# Patient Record
Sex: Female | Born: 1950 | Race: White | Hispanic: No | Marital: Single | Smoking: Never smoker
Health system: Southern US, Community
[De-identification: ages and names within clinical notes are randomized; demographics above are authoritative.]

## PROBLEM LIST (undated history)

## (undated) DIAGNOSIS — I890 Lymphedema, not elsewhere classified: Secondary | ICD-10-CM

## (undated) DIAGNOSIS — Z923 Personal history of irradiation: Secondary | ICD-10-CM

## (undated) DIAGNOSIS — K219 Gastro-esophageal reflux disease without esophagitis: Secondary | ICD-10-CM

## (undated) DIAGNOSIS — Z8719 Personal history of other diseases of the digestive system: Secondary | ICD-10-CM

## (undated) DIAGNOSIS — K759 Inflammatory liver disease, unspecified: Secondary | ICD-10-CM

## (undated) DIAGNOSIS — E785 Hyperlipidemia, unspecified: Secondary | ICD-10-CM

## (undated) DIAGNOSIS — Z8669 Personal history of other diseases of the nervous system and sense organs: Secondary | ICD-10-CM

## (undated) DIAGNOSIS — N289 Disorder of kidney and ureter, unspecified: Secondary | ICD-10-CM

## (undated) DIAGNOSIS — R519 Headache, unspecified: Secondary | ICD-10-CM

## (undated) DIAGNOSIS — L0390 Cellulitis, unspecified: Secondary | ICD-10-CM

## (undated) DIAGNOSIS — I493 Ventricular premature depolarization: Secondary | ICD-10-CM

## (undated) DIAGNOSIS — R51 Headache: Secondary | ICD-10-CM

## (undated) DIAGNOSIS — C50919 Malignant neoplasm of unspecified site of unspecified female breast: Secondary | ICD-10-CM

## (undated) DIAGNOSIS — M8580 Other specified disorders of bone density and structure, unspecified site: Secondary | ICD-10-CM

## (undated) DIAGNOSIS — C50512 Malignant neoplasm of lower-outer quadrant of left female breast: Secondary | ICD-10-CM

## (undated) DIAGNOSIS — M1990 Unspecified osteoarthritis, unspecified site: Secondary | ICD-10-CM

## (undated) DIAGNOSIS — J020 Streptococcal pharyngitis: Secondary | ICD-10-CM

## (undated) DIAGNOSIS — Z87442 Personal history of urinary calculi: Secondary | ICD-10-CM

## (undated) DIAGNOSIS — R011 Cardiac murmur, unspecified: Secondary | ICD-10-CM

## (undated) HISTORY — DX: Hyperlipidemia, unspecified: E78.5

## (undated) HISTORY — PX: TONSILLECTOMY AND ADENOIDECTOMY: SUR1326

## (undated) HISTORY — DX: Personal history of urinary calculi: Z87.442

## (undated) HISTORY — DX: Disorder of kidney and ureter, unspecified: N28.9

## (undated) HISTORY — DX: Personal history of irradiation: Z92.3

## (undated) HISTORY — DX: Lymphedema, not elsewhere classified: I89.0

## (undated) HISTORY — DX: Personal history of other diseases of the nervous system and sense organs: Z86.69

## (undated) HISTORY — PX: BREAST SURGERY: SHX581

## (undated) HISTORY — PX: FOOT SURGERY: SHX648

## (undated) HISTORY — DX: Malignant neoplasm of unspecified site of unspecified female breast: C50.919

## (undated) HISTORY — DX: Ventricular premature depolarization: I49.3

## (undated) HISTORY — PX: CYSTOGRAM: SHX1420

## (undated) HISTORY — DX: Cellulitis, unspecified: L03.90

## (undated) HISTORY — DX: Unspecified osteoarthritis, unspecified site: M19.90

## (undated) HISTORY — DX: Streptococcal pharyngitis: J02.0

## (undated) HISTORY — DX: Other specified disorders of bone density and structure, unspecified site: M85.80

---
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
## 1997-04-21 DIAGNOSIS — Z923 Personal history of irradiation: Secondary | ICD-10-CM

## 1997-04-21 DIAGNOSIS — C50919 Malignant neoplasm of unspecified site of unspecified female breast: Secondary | ICD-10-CM

## 1997-04-21 HISTORY — DX: Personal history of irradiation: Z92.3

## 1997-04-21 HISTORY — DX: Malignant neoplasm of unspecified site of unspecified female breast: C50.919

## 1997-04-21 HISTORY — PX: MASTECTOMY: SHX3

## 1997-10-02 ENCOUNTER — Other Ambulatory Visit: Admission: RE | Admit: 1997-10-02 | Discharge: 1997-10-02 | Payer: Self-pay | Admitting: General Surgery

## 1997-10-04 ENCOUNTER — Ambulatory Visit (HOSPITAL_BASED_OUTPATIENT_CLINIC_OR_DEPARTMENT_OTHER): Admission: RE | Admit: 1997-10-04 | Discharge: 1997-10-04 | Payer: Self-pay | Admitting: General Surgery

## 1997-10-27 ENCOUNTER — Inpatient Hospital Stay (HOSPITAL_COMMUNITY): Admission: RE | Admit: 1997-10-27 | Discharge: 1997-10-30 | Payer: Self-pay | Admitting: General Surgery

## 1997-10-27 HISTORY — PX: OTHER SURGICAL HISTORY: SHX169

## 1997-11-09 ENCOUNTER — Ambulatory Visit (HOSPITAL_COMMUNITY): Admission: RE | Admit: 1997-11-09 | Discharge: 1997-11-09 | Payer: Self-pay | Admitting: Oncology

## 1997-11-14 ENCOUNTER — Encounter: Admission: RE | Admit: 1997-11-14 | Discharge: 1998-02-07 | Payer: Self-pay | Admitting: Radiation Oncology

## 1998-01-18 ENCOUNTER — Inpatient Hospital Stay (HOSPITAL_COMMUNITY): Admission: AD | Admit: 1998-01-18 | Discharge: 1998-01-23 | Payer: Self-pay | Admitting: Oncology

## 1998-02-07 ENCOUNTER — Encounter: Admission: RE | Admit: 1998-02-07 | Discharge: 1998-05-08 | Payer: Self-pay | Admitting: Radiation Oncology

## 1998-02-10 ENCOUNTER — Ambulatory Visit (HOSPITAL_COMMUNITY): Admission: RE | Admit: 1998-02-10 | Discharge: 1998-02-10 | Payer: Self-pay | Admitting: Oncology

## 1999-01-20 HISTORY — PX: FINGER SURGERY: SHX640

## 1999-02-20 ENCOUNTER — Ambulatory Visit (HOSPITAL_BASED_OUTPATIENT_CLINIC_OR_DEPARTMENT_OTHER): Admission: RE | Admit: 1999-02-20 | Discharge: 1999-02-20 | Payer: Self-pay

## 2000-01-06 ENCOUNTER — Ambulatory Visit (HOSPITAL_BASED_OUTPATIENT_CLINIC_OR_DEPARTMENT_OTHER): Admission: RE | Admit: 2000-01-06 | Discharge: 2000-01-06 | Payer: Self-pay | Admitting: Plastic Surgery

## 2002-05-03 ENCOUNTER — Other Ambulatory Visit: Admission: RE | Admit: 2002-05-03 | Discharge: 2002-05-03 | Payer: Self-pay | Admitting: Obstetrics and Gynecology

## 2003-04-22 HISTORY — PX: OTHER SURGICAL HISTORY: SHX169

## 2003-05-02 ENCOUNTER — Ambulatory Visit (HOSPITAL_BASED_OUTPATIENT_CLINIC_OR_DEPARTMENT_OTHER): Admission: RE | Admit: 2003-05-02 | Discharge: 2003-05-02 | Payer: Self-pay | Admitting: Orthopedic Surgery

## 2003-05-02 ENCOUNTER — Ambulatory Visit (HOSPITAL_COMMUNITY): Admission: RE | Admit: 2003-05-02 | Discharge: 2003-05-02 | Payer: Self-pay | Admitting: Orthopedic Surgery

## 2003-07-03 ENCOUNTER — Other Ambulatory Visit: Admission: RE | Admit: 2003-07-03 | Discharge: 2003-07-03 | Payer: Self-pay | Admitting: Obstetrics and Gynecology

## 2004-04-21 HISTORY — PX: OTHER SURGICAL HISTORY: SHX169

## 2004-05-02 ENCOUNTER — Ambulatory Visit (HOSPITAL_COMMUNITY): Admission: RE | Admit: 2004-05-02 | Discharge: 2004-05-02 | Payer: Self-pay | Admitting: Orthopedic Surgery

## 2004-05-02 ENCOUNTER — Ambulatory Visit (HOSPITAL_BASED_OUTPATIENT_CLINIC_OR_DEPARTMENT_OTHER): Admission: RE | Admit: 2004-05-02 | Discharge: 2004-05-02 | Payer: Self-pay | Admitting: Orthopedic Surgery

## 2004-07-05 ENCOUNTER — Ambulatory Visit: Payer: Self-pay | Admitting: Oncology

## 2004-08-06 ENCOUNTER — Other Ambulatory Visit: Admission: RE | Admit: 2004-08-06 | Discharge: 2004-08-06 | Payer: Self-pay | Admitting: Obstetrics and Gynecology

## 2004-11-05 ENCOUNTER — Ambulatory Visit (HOSPITAL_COMMUNITY): Admission: RE | Admit: 2004-11-05 | Discharge: 2004-11-05 | Payer: Self-pay | Admitting: Orthopedic Surgery

## 2004-11-06 ENCOUNTER — Ambulatory Visit (HOSPITAL_COMMUNITY): Admission: RE | Admit: 2004-11-06 | Discharge: 2004-11-06 | Payer: Self-pay | Admitting: Orthopedic Surgery

## 2005-02-11 ENCOUNTER — Encounter (HOSPITAL_COMMUNITY): Admission: RE | Admit: 2005-02-11 | Discharge: 2005-05-12 | Payer: Self-pay | Admitting: Orthopedic Surgery

## 2005-05-20 ENCOUNTER — Encounter: Admission: RE | Admit: 2005-05-20 | Discharge: 2005-07-29 | Payer: Self-pay | Admitting: Orthopedic Surgery

## 2005-07-04 ENCOUNTER — Ambulatory Visit: Payer: Self-pay | Admitting: Oncology

## 2005-07-20 HISTORY — PX: FOOT SURGERY: SHX648

## 2005-08-11 ENCOUNTER — Ambulatory Visit (HOSPITAL_BASED_OUTPATIENT_CLINIC_OR_DEPARTMENT_OTHER): Admission: RE | Admit: 2005-08-11 | Discharge: 2005-08-11 | Payer: Self-pay | Admitting: Podiatry

## 2005-09-12 ENCOUNTER — Other Ambulatory Visit: Admission: RE | Admit: 2005-09-12 | Discharge: 2005-09-12 | Payer: Self-pay | Admitting: Obstetrics and Gynecology

## 2006-07-23 ENCOUNTER — Ambulatory Visit: Payer: Self-pay | Admitting: Oncology

## 2006-07-28 ENCOUNTER — Ambulatory Visit (HOSPITAL_COMMUNITY): Admission: RE | Admit: 2006-07-28 | Discharge: 2006-07-28 | Payer: Self-pay | Admitting: Oncology

## 2006-08-05 ENCOUNTER — Ambulatory Visit (HOSPITAL_COMMUNITY): Admission: RE | Admit: 2006-08-05 | Discharge: 2006-08-05 | Payer: Self-pay | Admitting: Oncology

## 2006-09-03 ENCOUNTER — Encounter (HOSPITAL_COMMUNITY): Admission: RE | Admit: 2006-09-03 | Discharge: 2006-11-30 | Payer: Self-pay | Admitting: Orthopedic Surgery

## 2006-11-10 ENCOUNTER — Other Ambulatory Visit: Admission: RE | Admit: 2006-11-10 | Discharge: 2006-11-10 | Payer: Self-pay | Admitting: Obstetrics and Gynecology

## 2007-02-19 ENCOUNTER — Ambulatory Visit: Payer: Self-pay | Admitting: Oncology

## 2007-07-09 ENCOUNTER — Ambulatory Visit: Payer: Self-pay | Admitting: Oncology

## 2007-07-20 ENCOUNTER — Encounter: Payer: Self-pay | Admitting: Cardiology

## 2007-11-23 ENCOUNTER — Other Ambulatory Visit: Admission: RE | Admit: 2007-11-23 | Discharge: 2007-11-23 | Payer: Self-pay | Admitting: Obstetrics and Gynecology

## 2008-02-22 ENCOUNTER — Ambulatory Visit: Payer: Self-pay | Admitting: Obstetrics and Gynecology

## 2008-04-04 ENCOUNTER — Ambulatory Visit: Payer: Self-pay | Admitting: Obstetrics and Gynecology

## 2008-07-07 ENCOUNTER — Ambulatory Visit: Payer: Self-pay | Admitting: Oncology

## 2008-07-27 ENCOUNTER — Encounter (HOSPITAL_COMMUNITY): Admission: RE | Admit: 2008-07-27 | Discharge: 2008-10-25 | Payer: Self-pay | Admitting: Orthopedic Surgery

## 2008-09-21 ENCOUNTER — Emergency Department (HOSPITAL_COMMUNITY): Admission: EM | Admit: 2008-09-21 | Discharge: 2008-09-21 | Payer: Self-pay | Admitting: Emergency Medicine

## 2009-01-09 ENCOUNTER — Ambulatory Visit: Payer: Self-pay | Admitting: Obstetrics and Gynecology

## 2009-01-09 ENCOUNTER — Encounter: Payer: Self-pay | Admitting: Obstetrics and Gynecology

## 2009-01-09 ENCOUNTER — Other Ambulatory Visit: Admission: RE | Admit: 2009-01-09 | Discharge: 2009-01-09 | Payer: Self-pay | Admitting: Obstetrics and Gynecology

## 2009-07-06 ENCOUNTER — Ambulatory Visit: Payer: Self-pay | Admitting: Oncology

## 2010-01-01 ENCOUNTER — Ambulatory Visit: Payer: Self-pay | Admitting: Internal Medicine

## 2010-01-01 ENCOUNTER — Ambulatory Visit (HOSPITAL_COMMUNITY): Admission: RE | Admit: 2010-01-01 | Discharge: 2010-01-01 | Payer: Self-pay | Admitting: Oncology

## 2010-01-06 ENCOUNTER — Telehealth: Payer: Self-pay | Admitting: Internal Medicine

## 2010-02-08 ENCOUNTER — Ambulatory Visit (HOSPITAL_COMMUNITY): Admission: RE | Admit: 2010-02-08 | Discharge: 2010-02-08 | Payer: Self-pay | Admitting: Orthopedic Surgery

## 2010-05-21 ENCOUNTER — Other Ambulatory Visit: Payer: Self-pay | Admitting: Obstetrics and Gynecology

## 2010-05-21 ENCOUNTER — Other Ambulatory Visit (HOSPITAL_COMMUNITY)
Admission: RE | Admit: 2010-05-21 | Discharge: 2010-05-21 | Disposition: A | Discharge status: Home / Self Care | Payer: 59 | Source: Ambulatory Visit | Attending: Obstetrics and Gynecology | Admitting: Obstetrics and Gynecology

## 2010-05-21 ENCOUNTER — Ambulatory Visit
Admission: RE | Admit: 2010-05-21 | Discharge: 2010-05-21 | Payer: Self-pay | Source: Home / Self Care | Attending: Obstetrics and Gynecology | Admitting: Obstetrics and Gynecology

## 2010-05-21 DIAGNOSIS — Z124 Encounter for screening for malignant neoplasm of cervix: Secondary | ICD-10-CM | POA: Insufficient documentation

## 2010-05-23 NOTE — Letter (Signed)
Summary: Order for Stat Creatinine From Dr. Truett Perna  BREAST CREA-STATFOR MRI   Imported By: Shon Hough 01/09/2010 11:56:28  _____________________________________________________________________  External Attachment:    Type:   Image     Comment:   External Document

## 2010-05-23 NOTE — Progress Notes (Signed)
Summary: Re: Creatinine result  ---- Converted from flag ---- ---- 01/04/2010 12:39 PM, Alric Quan wrote: Dr Meredith Pel,  The result must have routed to you based on the way Micheala was registered. She had an order from her physician for a Crea to be collected before her MRI. She should have been registered under her physians name.  The results were given to [facility] as instructed.   I will immediately fax a copy to her physcian Dr Truett Perna at the [facility].  Tracey  ---- 01/04/2010 12:31 PM, Margarito Liner MD wrote: Kenn[facility],  I don't know anything about this lab result.  Dorene Sorrow ------------------------------

## 2010-06-25 ENCOUNTER — Ambulatory Visit (INDEPENDENT_AMBULATORY_CARE_PROVIDER_SITE_OTHER): Payer: 59 | Admitting: Cardiology

## 2010-06-25 DIAGNOSIS — E7800 Pure hypercholesterolemia, unspecified: Secondary | ICD-10-CM

## 2010-06-25 DIAGNOSIS — R002 Palpitations: Secondary | ICD-10-CM

## 2010-06-25 DIAGNOSIS — R0602 Shortness of breath: Secondary | ICD-10-CM

## 2010-06-26 ENCOUNTER — Other Ambulatory Visit (INDEPENDENT_AMBULATORY_CARE_PROVIDER_SITE_OTHER): Payer: 59

## 2010-06-26 DIAGNOSIS — E7800 Pure hypercholesterolemia, unspecified: Secondary | ICD-10-CM

## 2010-07-04 LAB — CREATININE, SERUM
Creatinine, Ser: 0.69 mg/dL (ref 0.4–1.2)
GFR calc Af Amer: >60 mL/min (ref >60)
GFR calc non Af Amer: >60 mL/min (ref >60)

## 2010-07-09 ENCOUNTER — Encounter (HOSPITAL_BASED_OUTPATIENT_CLINIC_OR_DEPARTMENT_OTHER): Payer: 59 | Admitting: Oncology

## 2010-07-09 DIAGNOSIS — C50919 Malignant neoplasm of unspecified site of unspecified female breast: Secondary | ICD-10-CM

## 2010-07-09 DIAGNOSIS — IMO0002 Reserved for concepts with insufficient information to code with codable children: Secondary | ICD-10-CM

## 2010-07-09 DIAGNOSIS — Z803 Family history of malignant neoplasm of breast: Secondary | ICD-10-CM

## 2010-07-09 DIAGNOSIS — C50419 Malignant neoplasm of upper-outer quadrant of unspecified female breast: Secondary | ICD-10-CM

## 2010-07-16 ENCOUNTER — Other Ambulatory Visit (HOSPITAL_COMMUNITY): Payer: Self-pay | Admitting: Radiology

## 2010-07-16 DIAGNOSIS — R0600 Dyspnea, unspecified: Secondary | ICD-10-CM

## 2010-07-17 ENCOUNTER — Ambulatory Visit (HOSPITAL_COMMUNITY): Payer: 59 | Admitting: Radiology

## 2010-07-17 ENCOUNTER — Other Ambulatory Visit (HOSPITAL_COMMUNITY): Payer: 59 | Admitting: Radiology

## 2010-07-17 ENCOUNTER — Ambulatory Visit (HOSPITAL_COMMUNITY): Payer: 59 | Attending: Cardiology | Admitting: Radiology

## 2010-07-17 DIAGNOSIS — R0609 Other forms of dyspnea: Secondary | ICD-10-CM | POA: Insufficient documentation

## 2010-07-17 DIAGNOSIS — R0602 Shortness of breath: Secondary | ICD-10-CM

## 2010-07-17 DIAGNOSIS — R0989 Other specified symptoms and signs involving the circulatory and respiratory systems: Secondary | ICD-10-CM | POA: Insufficient documentation

## 2010-07-17 DIAGNOSIS — R0600 Dyspnea, unspecified: Secondary | ICD-10-CM

## 2010-07-18 ENCOUNTER — Telehealth: Payer: Self-pay | Admitting: *Deleted

## 2010-07-22 NOTE — Telephone Encounter (Signed)
Pt's mom Malachi Bonds notified of pt's stress echo results.

## 2010-09-06 NOTE — Op Note (Signed)
[place]. [facility]  Patient:    Cheryl [name], Cheryl [name]                      MRN: [id number] Proc. Date: 01/06/00 Adm. Date:  [id number] Attending:  Loura Halt Ii                           Operative Report  PREOPERATIVE DIAGNOSES: 1. Right breast cancer. 2. Acquired absence right breast.  POSTOPERATIVE DIAGNOSES: 1. Right breast cancer. 2. Acquired absence right breast.  PROCEDURE:  Right nipple reconstruction.  SURGEON:  Alfredia Ferguson, M.D.  ANESTHESIA:  None required.  INDICATIONS FOR SURGERY:  This is a 60 year old woman who is status post right mastectomy for breast cancer.  She underwent reconstruction with a TRAM flap. She is now ready for nipple reconstruction.  She understands the risk of failure due to vascular compromise.  DESCRIPTION OF SURGERY:  With the patient in the sitting position, location of the new nipple was marked.  The patient was then placed in a supine position and a 42 mm diameter circle was drawn.  Within the confines of this circle a tripartate flap was drawn which was inferiorly based.  The 3 points of the flap were pointing to the 9, 12, and 3 oclock positions.  The right reconstructive breast was prepped and draped in a sterile fashion.  The tripartate flap was now sized to the level of subcutaneous tissue.  The TIPS of each of the points was excised to create blunt ends of the flap.   The flap was now elevated with approximately 2-3 mm of thickness of fat on the flaps in a superior to inferior direction.  This left a approximately 2 cm wide inferior base random skin connection to the flap.  The 3 oclock and 9 oclock flaps were now rotated towards one another and the two blunt ends were closed together using interrupted 4-0 chromic suture.  This created a cylinder.  The flap which was pointed at the 12 oclock position was now closed down on top of this cylinder and fixed into position with a similar suture.   The donor site was now closed by approximating the dermis using interrupted 4-0 Vicryl suture.  The skin edges were closed using 4-0 chromic suture.  The viability of the flap appeared to be good although slightly pale. A bulky dressing was applied.  The patient was discharged home in satisfactory condition. DD:  01/06/00 TD:  01/07/00 Job: [id number]

## 2010-09-06 NOTE — Op Note (Signed)
[name], [name]               ACCOUNT NO.:  [id number]   MEDICAL RECORD NO.:  [id number]          PATIENT TYPE:  AMB   LOCATION:  DSC                          FACILITY:  [facility]   PHYSICIAN:  Dionne Ano. Gramig III, M.D.DATE OF BIRTH:  1950-08-05   DATE OF PROCEDURE:  05/02/2004  DATE OF DISCHARGE:                                 OPERATIVE REPORT   PREOPERATIVE DIAGNOSES:  1.  Left carpal tunnel syndrome.  2.  Left carpometacarpal joint arthritis, end stage.   POSTOPERATIVE DIAGNOSES:  1.  Left carpal tunnel syndrome.  2.  Left carpometacarpal joint arthritis, end stage.   OPERATION PERFORMED:  1.  Left limited open carpal tunnel release.  2.  Left carpometacarpal arthroplasty (trapezium excision at the left base      of thumb joint region), left thumb.  3.  Tendon transfer in the form of a one third abductor pollicis longus      proper portion of tendon transferred to the flexor carpi radialis and      abductor pollicis longus and back upon itself with multiple figure-of-      eight throws (Weilby tendon transfer/suspension) left base of thumb      joint.  4.  Tendon transfer in the form of a digastric abductor pollicis longus      tendon transfer to the first metacarpal flexor carpi radialis and back      upon itself with multiple figure-of-eight and weave throws (Zancolli      tendon transfer/suspension) left base of thumb joint.  5.  Abductor pollicis longus tenodesis (shortening of wrist extensor at the      wrist level, left base of thumb joint), last base of thumb joint.   SURGEON:  Dionne Ano. Cheryl [name], M.D.   ASSISTANT:  Karie [name], P.A.-C.   ANESTHESIA:  Block with IV sedation.   COMPLICATIONS:  None.   TOURNIQUET TIME:  Less than an hour.   INDICATIONS FOR PROCEDURE:  The patient is a very pleasant female who  presents with the above-mentioned diagnosis.  I have counseled the patient  in regard to the risks and benefits of surgery and she desires to  proceed  with the operative intervention as described.   DESCRIPTION OF PROCEDURE:  The patient was seen by myself and anesthesia.  Correct extremity to be operated on was identified and the correct operation  was discussed with the patient.  She was taken to the operative suite,  underwent sedation, prepped and draped in the usual sterile fashion with  Betadine scrub and paint about the left upper extremity.  I then gave her  prophylactic antibiotics in the form of Ancef.  Once this was done, the arm  was elevated, the tourniquet was insufflated to 250 mmHg and a 1 cm incision  was made at the to expose  the  transverse carpal ligament.  Dissection was  carried down and the palmar fascia was incised.  I then placed a retractor,  retracting the palmar fascia out of harm's way.  The palmar fascia then was  out of the way and allowed for identification  of the distal edge of the  transverse carpal ligament which was released under 4.0 loupe magnification  without difficulty.  The patient tolerated this well.  There were no  complicating features.  I noted egression of the fat pad.  Distal to  proximal dissection was carried out until adequate room was available for  canal preparatory devices.  These were placed without difficulty and  meticulously in the midline placement position just under the proximal  leading leaflet of the transverse carpal ligament.  Following this, I then  placed the security clip in the area.  The obturator disengaged and the  security knife was then placed in a security clip effectively releasing the  proximal leaflet of the transverse carpal ligament.  The patient tolerated  this well.  There were no complicating features.  Once this was done, I  irrigated copiously and then closed the wound with interrupted Prolene.  Following this, I turned attention toward the carpometacarpal joint.  The  patient had a dorsal incision made overlying the carpometacarpal joint.   The  radial artery and superficial radial nerve branches were identified and  protected.  Following this, I then performed incision of the capsule and the  interval between the APL and EPL was created.  I then excised the trapezium  piecemeal without difficulty and performed a tenolysis and a tenosynovectomy  of the FCR which was freed up in the depths of the wound.  This completed  the arthroplasty/trapezium excision of the procedure.  The patient then had  a drill hole made dorsal to palmar, exiting intra-articularly in line with  the palmar beak ligament.  The area was then washed out once again.  All  debris was removed and looked quite well.  The arthritis was very impressive  about the [facility] joint I should note with pantrapezial changes evident on  intraoperative inspection.   Once this was done, I made a counterincision dorsally and harvested a one  third proper portion of the APL and the digastric portion of the APL.  This  wound was irrigated meticulously and sutured.  The tendons were allowed to  be retracted into the main incision about the base of the [facility] joint.  I then  performed a tendon transfer of the abductor pollicis longus digastric  portion through the drill hole dorsal to palmar against the base of the  metacarpal around the FCR twice and back upon the APL proper.  This was  inset with FiberWire suture to my satisfaction without difficulty.  Following this, I then tightened it accordingly with capsule placed over the  metacarpal drill hole.  This completed the Zancolli tendon transfer.  Once  this was done, we then performed a tendon weave of the one third proper  portion around the FCR back upon the APL proper and back upon themselves  with multiple figure-of-eight throws completing the Weilby tendon transfer.  This was inset with 4-0 FiberWire to my satisfaction without difficulty and was tightened accordingly.  Once this was done, I then performed abductor  pollicis  longus tenodesis.  This was a shortening of the wrist extensor at  the wrist level with FiberWire.  I imbricated this against the capsule  shortening the APL slightly to prevent dorsolateral subluxation of the [facility]  joint.  The patient tolerated the procedure well.  There were no  complicating features.  Following this, I then irrigated copiously, deflated  the tourniquet and performed a capsular closure with FiberWire of the 4-0  variety.  Thus double tendon transfer was performed as well as CMC  arthroplasty with trapezium excision and the APL tenodesis.  The skin edge  was closed with Prolene after hemostasis was secured.  The patient tolerated  the procedure well.  She was placed in a sterile dressing and in a thumb  spica splint well molded to my satisfaction.  I have made plans for the  patient to receive antibiotics in the postoperative recovery area and  discharge to home if appropriate on appropriate pain medicines as discussed  with the patient.  All questions have been encouraged and answered.       WMG/MEDQ  D:  05/02/2004  T:  05/02/2004  Job:  [id number]

## 2010-09-06 NOTE — Consult Note (Signed)
NAMENEVEA, Cheryl [name]               ACCOUNT NO.:  [id number]   MEDICAL RECORD NO.:  [id number]          PATIENT TYPE:  AMB   LOCATION:  DSC                          FACILITY:  [facility]   PHYSICIAN:  Ysidro Evert. Regal, D.P.M. DATE OF BIRTH:  [birth year]   DATE OF CONSULTATION:  08/11/2005  DATE OF DISCHARGE:  08/11/2005                                   CONSULTATION   PREOPERATIVE DIAGNOSES:  1.  Hallux abductor valgus deformity left.  2.  Plantar flexor second metatarsal left.  3.  Hammertoe syndrome second digit left.  4.  Hammertoe syndrome third digit left.   POSTOPERATIVE DIAGNOSIS:  1.  Hallux abductor valgus deformity left.  2.  Plantar flexor second metatarsal left.  3.  Hammertoe syndrome second digit left.  4.  Hammertoe syndrome third digit left.   SURGEON:  Ysidro Evert. Regal, D.P.M.   PROCEDURE:  Osteotomy with 4.5 K-wire left:  #1 sternal, #4 fusion with pin,  #3 left external K-wire fixation.   INDICATIONS:  Chronic discomfort, inability to wear shoe gear without  difficulty.  Has tried wider shoes.  Has tried padding and has tried anti-  inflammatories without relief of symptoms.   FINDINGS AND PROCEDURE:  The patient was brought to the OR and placed in the  supine position on the OR table.  The patient was injected with a total of  20 mL Alcaine/Marcaine mixture.  The patient's left foot was prepped and  draped utilizing standard ___________ technique.  The left foot was  exsanguinated utilizing esmarch and the left ankle tourniquet was inflated  to 250 mmHg.  The following procedures were performed:  Procedure #1, [place]  bunionectomy left:  Attention was directed dorsal aspect left foot where an  approximate 7 cm linear incision was made.  Medial imminence was exposed and  resected.  Attention was then directed to the first intermetatarsal space  with utilizing sharp and blunt dissection.  The conjoint tendon of the  abductor longus muscle belly was identified and a  section added to the  insertion of the base of the proximal phalanx.  Attention was then directed  back to the medial aspect of the first metatarsal head where a V-shaped  osteotomy was performed in the head and neck of the first metatarsal with  apex bent into tabs and based to the level of the anatomical neck of the  first metatarsal.  The capital of the neck was transposed in a lateral  direction so as to reduce the increased 1-2 in the metatarsal angle and was  fixated utilizing 4.5 K-wire.  The redundant medial shelf was resected plush  with the shaft of the first metatarsal and all roughened bone edges were  rasped smooth.  The wound was flushed with copious amounts of tobramycin  solution. The toe was placed in a derotated position and the capsule was  sutured with 3-0 Dexon in a simple interrupted fashion.  Subcutaneous tissue  reapproximated and skin margins reapproximated utilizing 5-0 Dexon in  subcuticular fashion.   Procedure #2:  Elevated osteotomy second metatarsal left:  Attention was  directed through subcutaneous tissue with hemostasis being acquired as  necessary.  The incision was further deepened down to the level of capsule  and a linear capsule incision was performed on the head and neck of the  second metatarsal.  The head and neck of the second metatarsal exposed and  an osteotomy was performed.  Apex, plantar and base dorsal so as to allow a  wedge of bone to be removed.  We then elevated the second metatarsal and it  was fixated utilizing 2.0 self-tapping cortical screw fixation 12 mm.  The  capsular tissue was reapproximated utilizing 4-0 Dexon continuous running  fashion.  Subcutaneous tissue reapproximated utilizing a semi-elliptical  linear incision was made measuring 4 cm.  The incision was deepened down  through to the capsular level and the intervening skin, a transverse  incision into the extensor expanse was made to the level of the proximal  phalangeal  joint and the head of the proximal phalanx and the base of the  middle phalanx were exposed and they were then resected just the  cartilaginous surfaces.  The toe was placed in a straightened position, was  fixated utilizing 4.5 external K-wire fixation.  It was found to be in good  alignment and was sutured utilizing 5-0 nylon for skin suturing.   Procedure #3:  Digit fusion digit three left:  For all intents and purposes,  the procedure was performed in an identical fashion to #2 for the left foot  using the same fixation.  The areas were then infiltrated with 2 mL of  dexamethasone and a dry, sterile compressive dressing was applied to the  left foot.  The patient was taken to the recovery in satisfactory condition  and was discharged by the Department of Anesthesia with postoperative  instructions and medications.           ______________________________  Ysidro Evert Regal, D.P.M.     NSR/MEDQ  D:  10/29/2005  T:  10/30/2005  Job:  [id number]

## 2010-09-06 NOTE — Op Note (Signed)
NAME:  Cheryl [name], Cheryl [name]                         ACCOUNT NO.:  [id number]   MEDICAL RECORD NO.:  [id number]                   PATIENT TYPE:  AMB   LOCATION:  DSC                                  FACILITY:  [facility]   PHYSICIAN:  Dionne Ano. Everlene Other, M.D.         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   PROCEDURE:  1. Right carpometacarpal arthroplasty (removal of trapezium at the base of     the thumb joint level).  2. Abductor pollicis longus digastric portion tendon transfer to the first     metacarpal through drill holes around the flexor carpi radialis and back     upon itself (Zancolli tendon transfer/suspension).  3. Abductor pollicis longus one-third proper portion tendon transfer to the     flexor carpi radialis and abductor pollicis longus proper portion with     multiple figure-of-eight throws (Weilby tendon transfer/suspension).  4. Abductor pollicis longus tenodesis, right base of her thumb joint     (shortening of the wrist extensor at the wrist forearm level).   SURGEON:  Dionne Ano. Amanda Pea, M.D.   ASSISTANT:  Karie Chimera, P.A.-C.   COMPLICATIONS:  None.   ANESTHESIA:  General.   TOURNIQUET TIME:  Less than an hour.   INDICATION FOR THE PROCEDURE:  The patient is a very pleasant 60 year old  female who presents with the above-mentioned diagnoses.  I have counseled  her in regards to risks and benefits of surgery including risks of  infection, bleeding, anesthesia, damage to normal structures and failure of  the surgery to accomplish its intended goals of relieving symptoms and  restoring function.  With this in mind, she desires to proceed.   All  questions have been encouraged and answered preoperatively.   OPERATIVE FINDINGS:  The patient had end-stage degenerative changes about  her thumb without MCP hyperextension.  She underwent reconstruction without  difficulty.  I was pleased with the alignment, stability and creation of a  new joint at the conclusion of the procedure.  All surgical procedures went  without complicating features.   OPERATION IN DETAIL:  The patient was seen by myself and anesthesia.  Correct extremity and patient were identified.  She was taken to the  operative suite.  Permit was signed.  She was prepped and draped in the  usual sterile fashion after a smooth induction of general anesthesia.  Following this, the right upper extremity was isolated and examination under  anesthesia revealed significant crepitance and the deformity (longstanding  CMC arthritis with dorsolateral subluxation at the [facility] region).  The  operation commenced with an incision dorsally overlying the CMC joint.  Dissection was carried  down sharply through skin with a knife blade.  An  interval between the APL and EPB was created.  The capsule was then incised,  a Therapist, nutritional placed on either side of the trapezium and it was then  excised piecemeal.  I should note that this completed the arthroplasty  portion of the procedure.  The superficial radial nerve and radial artery  were identified and protected at all times during this.  The patient  tolerated this well without complicating features.  Once this was done, I  then made a drill hole from dorsal to palmar, exiting intra-articularly in  line with the palmar beak ligament.  This intra-articular drill hole was  enlarged to a 0.35 drill bit.  Once this was done, the patient then  underwent irrigation of the depths of the wound and freeing of the FCR  tendon; this was a tenolysis of the FCR tendon.  Following this, I then  irrigated the wound and harvested the APL digastric  portion and one-third  proper portion of the APL (abductor pollicis longus) at the distal-third  forearm level through a 2-cm counterincision which was transverse.  This  incision was made.  Dissection was carried down, superficial radial nerve  projected.  The tendon was clipped and following this, the tendon was  retracted to its base about the 1st metacarpal x2 (that is, the APL proper,  one-third proper portion, and the digastric portion of the APL).  Once this  was done, the patient underwent tendon transfer of the APL digastric portion  through the drill hole, dorsal to palmar, around the FCR and back upon  itself.  This completed the Zancolli's suspension/tendon transfer and was  inset with 4-0 Fibrewire.  Following this, the patient had the one-third  proper portion of the APL placed underneath the FCR and then around the APL  proper and around the FCR in multiple figure-of-eight throws through each  other, completing the second tendon transfer/Weilby tendon  transfer/suspension.  These transfers were inset nicely.  I wove the  remaining tendon strands into an anchovy and stuffed it into the joint.  Following this, the abductor pollicis longus was tenodesed to prevent  hyperextension of the MCP joint and, of course, dorsolateral subluxation at  the [facility] joint.  This was done to my satisfaction without difficulty with 4-0  Fibrewire.  The tenodesis was performed and allowed for excellent stability  about the joint.  I was pleased with this and the findings.  In addition to  this, I stress-tested her intraoperatively and was very pleased with the  findings.  Following this, we then deflated the tourniquet, obtained  hemostasis and irrigated the wound copiously.  I did close the capsule with  4-0 Fibrewire and checked the superficial radial nerve, which looked  excellent.  The patient tolerated this well without difficulty and there were no complicating features with her surgery.  All  sponge, needle and  instrument counts were reported as correct and there were no immediate  intraoperative complications.  The patient will be monitored in the recovery  area and spend the night with [place] for observation.  She will be discharged  home in the morning if appropriate.  I have discussed ____________ , etc,  and all questions have been encouraged and answered.  Her operation went  without complicating feature.  Dionne Ano. Everlene Other, M.D.    Nash Mantis  D:  05/02/2003  T:  05/03/2003  Job:  [id number]

## 2010-09-23 ENCOUNTER — Encounter (INDEPENDENT_AMBULATORY_CARE_PROVIDER_SITE_OTHER): Payer: Self-pay | Admitting: Surgery

## 2010-12-17 ENCOUNTER — Other Ambulatory Visit: Payer: Self-pay | Admitting: Oncology

## 2010-12-17 DIAGNOSIS — Z1231 Encounter for screening mammogram for malignant neoplasm of breast: Secondary | ICD-10-CM

## 2010-12-17 DIAGNOSIS — Z9011 Acquired absence of right breast and nipple: Secondary | ICD-10-CM

## 2011-01-06 ENCOUNTER — Ambulatory Visit
Admission: RE | Admit: 2011-01-06 | Discharge: 2011-01-06 | Disposition: A | Discharge status: Home / Self Care | Payer: 59 | Source: Ambulatory Visit | Attending: Oncology | Admitting: Oncology

## 2011-01-06 DIAGNOSIS — Z1231 Encounter for screening mammogram for malignant neoplasm of breast: Secondary | ICD-10-CM

## 2011-01-06 DIAGNOSIS — Z9011 Acquired absence of right breast and nipple: Secondary | ICD-10-CM

## 2011-02-03 ENCOUNTER — Encounter (INDEPENDENT_AMBULATORY_CARE_PROVIDER_SITE_OTHER): Payer: Self-pay | Admitting: General Surgery

## 2011-02-03 DIAGNOSIS — Z853 Personal history of malignant neoplasm of breast: Secondary | ICD-10-CM | POA: Insufficient documentation

## 2011-02-04 ENCOUNTER — Encounter (INDEPENDENT_AMBULATORY_CARE_PROVIDER_SITE_OTHER): Payer: Self-pay | Admitting: Surgery

## 2011-02-04 ENCOUNTER — Ambulatory Visit (INDEPENDENT_AMBULATORY_CARE_PROVIDER_SITE_OTHER): Payer: Commercial Managed Care - PPO | Admitting: Surgery

## 2011-02-04 VITALS — BP 124/84 | HR 64 | Temp 96.6°F | Resp 16 | Ht 61.0 in | Wt 141.1 lb

## 2011-02-04 DIAGNOSIS — Z853 Personal history of malignant neoplasm of breast: Secondary | ICD-10-CM

## 2011-02-04 NOTE — Progress Notes (Signed)
NAME: Cheryl [name]       DOB: 1951-03-20           DATE: 02/04/2011       MRN: [id number]   Aubre A Lor is a 60 y.o.Marland Kitchenfemale who presents for routine followup of her Right breast cancerdiagnosed in 1999 and treated with Mastectomy, radiation. She has no problems or concerns on either side.  PFSH: She has had no significant changes since the last visit here.  ROS: There have been no significant changes since the last visit here  EXAM: General: The patient is alert, oriented, generally healty appearing, NAD. Mood and affect are normal.  Breasts:  Right side S/P mastectomy and TRAM. There are hard areas along the medial aspect of the superior scar consistent with fat necrosis. No other abnormality is noted. Left breast is normal  Lymphatics: She has no axillary or supraclavicular adenopathy on either side.  Extremities: Full ROM of the surgical side with no lymphedema noted.  Data Reviewed: Recent mammogram OK  Impression: Doing well, with no evidence of recurrent cancer or new cancer  Plan: Will continue to follow up on an annual basis here.

## 2011-02-04 NOTE — Patient Instructions (Signed)
I will see you in a year. Come back sooner if any problems. Have an MRI next year after your mammogram next year

## 2011-04-28 ENCOUNTER — Telehealth: Payer: Self-pay | Admitting: Oncology

## 2011-04-28 NOTE — Telephone Encounter (Signed)
lmonvm with the pt's march appts

## 2011-05-09 ENCOUNTER — Other Ambulatory Visit: Payer: Self-pay | Admitting: *Deleted

## 2011-05-09 NOTE — Telephone Encounter (Signed)
Faxed refill for mastectomy products due to continued need.

## 2011-05-13 ENCOUNTER — Telehealth: Payer: Self-pay | Admitting: Oncology

## 2011-05-13 NOTE — Telephone Encounter (Signed)
S/w the pt and she is aware of her march 2013 appts 

## 2011-06-24 ENCOUNTER — Encounter: Payer: Self-pay | Admitting: *Deleted

## 2011-06-26 ENCOUNTER — Ambulatory Visit (INDEPENDENT_AMBULATORY_CARE_PROVIDER_SITE_OTHER): Payer: 59 | Admitting: Cardiology

## 2011-06-26 ENCOUNTER — Encounter: Payer: Self-pay | Admitting: Cardiology

## 2011-06-26 VITALS — BP 114/72 | HR 75 | Ht 61.0 in | Wt 146.8 lb

## 2011-06-26 DIAGNOSIS — E7800 Pure hypercholesterolemia, unspecified: Secondary | ICD-10-CM

## 2011-06-26 DIAGNOSIS — I493 Ventricular premature depolarization: Secondary | ICD-10-CM | POA: Insufficient documentation

## 2011-06-26 DIAGNOSIS — E785 Hyperlipidemia, unspecified: Secondary | ICD-10-CM | POA: Insufficient documentation

## 2011-06-26 DIAGNOSIS — I4949 Other premature depolarization: Secondary | ICD-10-CM

## 2011-06-26 MED ORDER — ROSUVASTATIN CALCIUM 20 MG PO TABS: 20.0000 mg | ORAL_TABLET | Freq: Every day | ORAL | Status: DC

## 2011-06-26 NOTE — Patient Instructions (Signed)
Stop Vytorin.  Start Crestor 20mg  daily.  Your physician recommends that you return for a FASTING lipid profile /liver profile in 2 months.  Your physician wants you to follow-up in: 1 year with Dr Shirlee Latch. (March 2014). You will receive a reminder letter in the mail two months in advance. If you don't receive a letter, please call our office to schedule the follow-up appointment.

## 2011-06-26 NOTE — Progress Notes (Signed)
PCP: Dr. Jacky Kindle  61 yo with history of PVCs and hyperlipidemia presents for cardiology followup.  Patient has been seen by Dr. Deborah Chalk in the past and is seen by me for the first time today.  She had a stress echo done in 3/12.  It was submaximal but she did reach stage 4 and there were no regional wall motion abnormalities.  She walks about 1/2 hour twice a day with her dog.  No exertional chest pain or dyspnea.  She has chronic lymphedema in her right arm.  She has only rare palpitations on propranolol.  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  She has, of note, had very high LDL despite Vytorin use.    ECG: NSR, normal  Labs (1/13): LDL 158, HDL 70, TSH normal, K 4.4, creatinine 0.8  PMH: 1. 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 Stress echo (3/12): Normal submaximal stress echo (reached stage 4). 3. Hyperlipidemia 4. Breast cancer: Diagnosed late '90s.  She had mastectomy with development of chronic lymphedema in the right arm.  She had adriamycin in 1999.  5. Migraines 6. 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  Nonsmoker.   FH: Father with CABG in his 38s. Hyperlipidemia.   ROS: All systems reviewed and negative except as per HPI.   Current Outpatient Prescriptions  Medication Sig Dispense Refill  . aspirin 81 MG tablet Take 81 mg by mouth daily. Three times a week      . cephALEXin (KEFLEX) 500 MG capsule Take 500 mg by mouth as needed.      . diclofenac (VOLTAREN) 75 MG EC tablet Take 75 mg by mouth as needed.      . ergocalciferol (VITAMIN D2) 50000 UNITS capsule Take 50,000 Units by mouth once a week.        . propranolol (INDERAL LA) 80 MG 24 hr capsule Take 80 mg by mouth daily.      . rizatriptan (MAXALT) 10 MG tablet Take 10 mg by mouth as needed. May repeat in 2 hours if needed      . rosuvastatin (CRESTOR) 20 MG tablet Take 1 tablet (20 mg total) by mouth at bedtime.  90 tablet  1   BP 114/72  Pulse 75  Ht 5\' 1"   (1.549 m)  Wt 146 lb 12.8 oz (66.588 kg)  BMI 27.74 kg/m2 General: NAD Neck: No JVD, no thyromegaly or thyroid nodule.  Lungs: Clear to auscultation bilaterally with normal respiratory effort. CV: Nondisplaced PMI.  Heart regular S1/S2, no S3/S4, no murmur.  No peripheral edema.  No carotid bruit.  Normal pedal pulses.  Abdomen: Soft, nontender, no hepatosplenomegaly, no distention.  Neurologic: Alert and oriented x 3.  Psych: Normal affect. Extremities: No clubbing or cyanosis.

## 2011-06-26 NOTE — Assessment & Plan Note (Signed)
LDL remains quite high on Vytorin 10/80.  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  I will have her stop Vytorin and use Crestor 20 mg daily.  Lipids/LFTs in 2 months.

## 2011-06-26 NOTE — Assessment & Plan Note (Signed)
Minimal palpitations on propranolol.

## 2011-07-10 ENCOUNTER — Ambulatory Visit: Payer: 59 | Admitting: Oncology

## 2011-07-15 ENCOUNTER — Ambulatory Visit (HOSPITAL_BASED_OUTPATIENT_CLINIC_OR_DEPARTMENT_OTHER): Payer: 59 | Admitting: Oncology

## 2011-07-15 ENCOUNTER — Other Ambulatory Visit: Payer: Self-pay | Admitting: Oncology

## 2011-07-15 VITALS — BP 120/77 | HR 66 | Temp 97.7°F | Ht 61.0 in | Wt 147.4 lb

## 2011-07-15 DIAGNOSIS — Z853 Personal history of malignant neoplasm of breast: Secondary | ICD-10-CM

## 2011-07-15 NOTE — Progress Notes (Signed)
OFFICE PROGRESS NOTE   INTERVAL HISTORY:   She returns as scheduled. She had an episode of right arm cellulitis in February. This resolved after Keflex. She otherwise feels well. Stable right arm lymphedema. A mammogram was negative in September of 2012.  Objective:  Vital signs in last 24 hours:  Blood pressure 120/77, pulse 66, temperature 97.7 F (36.5 C), temperature source Oral, height 5\' 1"  (1.549 m), weight 147 lb 6.4 oz (66.86 kg).    HEENT: Neck without mass Lymphatics: No cervical, supraclavicular, or axillary nodes Resp: Lungs clear bilaterally Cardio: Regular rate and rhythm GI: No hepatomegaly Vascular: No leg edema, lymphedema sleeve in place at the right arm Breast: Status post right mastectomy with a TRAM reconstruction. Firm nodular tissue at the medial aspect of the TRAM site. Left breast without mass.    Medications: I have reviewed the patient's current medications.  Assessment/Plan: 1. Stage III right-sided breast cancer diagnosed in 1999.  She remains in clinical remission.  She completed adjuvant Femara therapy at the end of August 2009. 2. Left renal lesion on CT scan in April 2008 - a renal ultrasound confirmed bilateral renal cysts. 3. Osteopenia - she continues Fosamax and vitamin D.  She is followed by Dr Eda Paschal. 4. 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 5. BRCA2 variant of unclear clinical significance, with a significant family history of breast cancer. 6. History of hematuria - reports being diagnosed with a renal stone by Dr Retta Diones.   Disposition:  She remains in remission from breast cancer. She will be scheduled for a mammogram in September of 2013. She will return for an office visit in one year. I do not recommend further screening breast MRI scans.   Lucile Shutters, MD  07/15/2011  12:36 PM

## 2011-07-16 ENCOUNTER — Telehealth: Payer: Self-pay | Admitting: Oncology

## 2011-07-16 NOTE — Telephone Encounter (Signed)
called pt and scheduled mammogram for 09/30 @ [facility]

## 2011-09-02 ENCOUNTER — Other Ambulatory Visit (INDEPENDENT_AMBULATORY_CARE_PROVIDER_SITE_OTHER): Payer: 59

## 2011-09-02 DIAGNOSIS — E7800 Pure hypercholesterolemia, unspecified: Secondary | ICD-10-CM

## 2011-09-02 LAB — LIPID PANEL
HDL: 60.8 mg/dL (ref >39.00)
LDL Cholesterol: 97 mg/dL (ref 0–99)
Total CHOL/HDL Ratio: 3
Triglycerides: 110 mg/dL (ref 0.0–149.0)
VLDL: 22 mg/dL (ref 0.0–40.0)

## 2011-09-02 LAB — HEPATIC FUNCTION PANEL
ALT: 23 U/L (ref 0–35)
Albumin: 3.8 g/dL (ref 3.5–5.2)
Bilirubin, Direct: 0 mg/dL (ref 0.0–0.3)
Total Protein: 7.6 g/dL (ref 6.0–8.3)

## 2012-01-19 ENCOUNTER — Ambulatory Visit
Admission: RE | Admit: 2012-01-19 | Discharge: 2012-01-19 | Disposition: A | Discharge status: Home / Self Care | Payer: 59 | Source: Ambulatory Visit | Attending: Oncology | Admitting: Oncology

## 2012-01-19 DIAGNOSIS — Z853 Personal history of malignant neoplasm of breast: Secondary | ICD-10-CM

## 2012-03-02 ENCOUNTER — Ambulatory Visit (INDEPENDENT_AMBULATORY_CARE_PROVIDER_SITE_OTHER): Payer: Commercial Managed Care - PPO | Admitting: Surgery

## 2012-03-02 ENCOUNTER — Encounter (INDEPENDENT_AMBULATORY_CARE_PROVIDER_SITE_OTHER): Payer: Self-pay | Admitting: Surgery

## 2012-03-02 VITALS — BP 126/80 | HR 68 | Temp 98.2°F | Resp 18 | Ht 60.0 in | Wt 147.6 lb

## 2012-03-02 DIAGNOSIS — Z853 Personal history of malignant neoplasm of breast: Secondary | ICD-10-CM

## 2012-03-02 NOTE — Progress Notes (Signed)
NAME: Nataleigh A Richert       DOB: June 12, 1950           DATE: 03/02/2012       MRN: [id number]   Marriana A Bail is a 61 y.o.Marland Kitchenfemale who presents for routine followup of her Right breast cancer diagnosed in 1999 and treated with Mastectomy, radiation. She has no problems or concerns on either side.  PFSH: She has had no significant changes since the last visit here.  ROS: There have been no significant changes since the last visit here  EXAM: General: The patient is alert, oriented, generally healty appearing, NAD. Mood and affect are normal.  Breasts:  Right side S/P mastectomy and TRAM. There are hard areas along the medial aspect of the superior scar consistent with fat necrosis. No other abnormality is noted. Left breast is normal  Lymphatics: She has no axillary or supraclavicular adenopathy on either side.  Extremities: Full ROM of the surgical side with no lymphedema noted.  Data Reviewed: Recent mammogram OK IMPRESSION:  No evidence of malignancy. Screening mammography is recommended in  one year.  RECOMMENDATION:  Screening mammogram in one year. (Code:[id number]-B-01Y)  BI-RADS CATEGORY 1: Negative.   Impression: Doing well, with no evidence of recurrent cancer or new cancer  Plan: Will continue to follow up on an annual basis here.

## 2012-03-17 ENCOUNTER — Ambulatory Visit (INDEPENDENT_AMBULATORY_CARE_PROVIDER_SITE_OTHER): Payer: 59 | Admitting: Obstetrics and Gynecology

## 2012-03-17 ENCOUNTER — Encounter: Payer: Self-pay | Admitting: Obstetrics and Gynecology

## 2012-03-17 VITALS — BP 138/84 | Ht 60.0 in | Wt 141.0 lb

## 2012-03-17 DIAGNOSIS — M8580 Other specified disorders of bone density and structure, unspecified site: Secondary | ICD-10-CM

## 2012-03-17 DIAGNOSIS — N63 Unspecified lump in unspecified breast: Secondary | ICD-10-CM

## 2012-03-17 DIAGNOSIS — C50919 Malignant neoplasm of unspecified site of unspecified female breast: Secondary | ICD-10-CM | POA: Insufficient documentation

## 2012-03-17 DIAGNOSIS — M949 Disorder of cartilage, unspecified: Secondary | ICD-10-CM

## 2012-03-17 DIAGNOSIS — Z01419 Encounter for gynecological examination (general) (routine) without abnormal findings: Secondary | ICD-10-CM

## 2012-03-17 DIAGNOSIS — I890 Lymphedema, not elsewhere classified: Secondary | ICD-10-CM

## 2012-03-17 DIAGNOSIS — Z1501 Genetic susceptibility to malignant neoplasm of breast: Secondary | ICD-10-CM | POA: Insufficient documentation

## 2012-03-17 DIAGNOSIS — Z1589 Genetic susceptibility to other disease: Secondary | ICD-10-CM | POA: Insufficient documentation

## 2012-03-17 DIAGNOSIS — N952 Postmenopausal atrophic vaginitis: Secondary | ICD-10-CM

## 2012-03-17 NOTE — Progress Notes (Signed)
Patient came to see me today for further followup. She is status post right mastectomy with TRAM for breast cancer. She is doing well without recurrence. She does get lymphedema of the right arm. Both her mother and her were checked for BRCA1 and BRCA2 by me. They do have a mutation of BRCA2 but is of uncertain significance and oophorectomy is not recommended. She does have osteopenia. She is currently on drug holiday. She has had no fractures. She is having no vaginal bleeding. She is having no pelvic pain. She has never had an abnormal Pap smear. She is not sexually active. Her last Pap smear was 2012. She does have atrophic vaginitis. She is asymptomatic. She does not have urinary frequency, urgency, incontinence or hematuria.  ROS: 12 system review done. Pertinent positives above.Other positive is premature ventricular contractions.  Physical examination: HEENT within normal limits. Neck: Thyroid not large. No masses. Supraclavicular nodes: not enlarged. Breasts:She is status post right mastectomy with TRAM. At the incisional site there is a 0.5 cm nodule that has been there since the TRAM and is unchanged and previously evaluated by Dr. Jamey Ripa. Her left breast is without masses or skin changes. She does have lymphedema of right arm Abdomen: Soft no guarding rebound or masses or hernia. Pelvic: External: Within normal limits. BUS: Within normal limits. Vaginal:within normal limits. Poor  estrogen effect. No evidence of cystocele rectocele or enterocele. Cervix: clean. Uterus: Normal size and shape. Adnexa: No masses. Rectovaginal exam: Confirmatory and negative.  Assessment: #1. Right breast cancer. #2. BRCA-2 mutation positive of uncertain significance. #3. Stable nodule at site of TRAM. #4. Atrophic vaginitis #5. Osteopenia#6. Lymphedema.  Plan: Continue yearly mammograms. Patient to get treatment   today for lymphedema of right arm. Bone density. Pap not done.The new Pap smear guidelines were  discussed with the patient.  Extremities: Within normal limits.

## 2012-03-17 NOTE — Patient Instructions (Signed)
Schedule bone density.    

## 2012-03-23 ENCOUNTER — Telehealth: Payer: Self-pay | Admitting: *Deleted

## 2012-03-23 NOTE — Telephone Encounter (Signed)
Pt had annual exam on 11/27. I had placed an order for a UA as standard for complete exams. The pt didn't leave a sample. I called her and she stated she is seeing a urologist next week. KW

## 2012-04-02 ENCOUNTER — Encounter: Payer: Self-pay | Admitting: Obstetrics and Gynecology

## 2012-05-03 ENCOUNTER — Other Ambulatory Visit: Payer: Self-pay | Admitting: Cardiology

## 2012-05-06 ENCOUNTER — Encounter: Payer: Self-pay | Admitting: Cardiology

## 2012-07-15 ENCOUNTER — Ambulatory Visit: Payer: 59 | Admitting: Oncology

## 2012-07-16 ENCOUNTER — Telehealth: Payer: Self-pay | Admitting: Oncology

## 2012-07-16 NOTE — Telephone Encounter (Signed)
Pt called and r/s missed appt for 07/15/12 to May 2014, MD only no labs

## 2012-08-26 ENCOUNTER — Telehealth: Payer: Self-pay | Admitting: Oncology

## 2012-08-26 ENCOUNTER — Ambulatory Visit (HOSPITAL_BASED_OUTPATIENT_CLINIC_OR_DEPARTMENT_OTHER): Payer: 59 | Admitting: Oncology

## 2012-08-26 VITALS — BP 127/74 | HR 71 | Temp 98.5°F | Resp 18 | Ht 61.5 in | Wt 146.6 lb

## 2012-08-26 DIAGNOSIS — C50911 Malignant neoplasm of unspecified site of right female breast: Secondary | ICD-10-CM

## 2012-08-26 DIAGNOSIS — Z853 Personal history of malignant neoplasm of breast: Secondary | ICD-10-CM

## 2012-08-26 DIAGNOSIS — M899 Disorder of bone, unspecified: Secondary | ICD-10-CM

## 2012-08-26 DIAGNOSIS — L0291 Cutaneous abscess, unspecified: Secondary | ICD-10-CM

## 2012-08-26 DIAGNOSIS — I890 Lymphedema, not elsewhere classified: Secondary | ICD-10-CM

## 2012-08-26 NOTE — Telephone Encounter (Signed)
GV AND PRINTED APPT SCHED AND AVS...PT WANTED TO SCHED HER OWN MAMMO

## 2012-08-26 NOTE — Progress Notes (Signed)
   [facility]    OFFICE PROGRESS NOTE   INTERVAL HISTORY:   She returns as scheduled. No complaint today. No change over the chest wall. She continues to have right arm lymphedema.  A left mammogram on 01/20/2012 was negative.  She had an episode of right arm and chest cellulitis in April. This resolved after taking Keflex.  Objective:  Vital signs in last 24 hours:  Blood pressure 127/74, pulse 71, temperature 98.5 F (36.9 C), temperature source Oral, resp. rate 18, height 5' 1.5" (1.562 m), weight 146 lb 9.6 oz (66.497 kg).    HEENT: Neck without mass Lymphatics: No cervical, supra-clavicular, or axillary nodes Resp: Lungs clear bilaterally Cardio: Regular rate and rhythm GI: No hepatomegaly Vascular: No leg edema, mild edema of the right arm Breasts: Status post right mastectomy with a TRAM reconstruction. Firm nodular tissue at the medial aspect of the tram site. Left breast without mass     Medications: I have reviewed the patient's current medications.  Assessment/Plan: 1.Stage III right-sided breast cancer diagnosed in 1999. She remains in clinical remission. She completed adjuvant Femara therapy at the end of August 2009.  2. Left renal lesion on CT scan in April 2008 - a renal ultrasound confirmed bilateral renal cysts.  3. Osteopenia - she continues Fosamax and vitamin D. She is followed by Dr Eda Paschal.  4. 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  5. BRCA2 variant of unclear clinical significance, with a significant family history of breast cancer.  6. History of hematuria - reports being diagnosed with a renal stone by Dr Retta Diones.  Disposition:  She remains in remission from breast cancer. She will be scheduled for a left mammogram in October of 2014. She will see Dr. Jamey Ripa in November. She will return for an office visit here in one year.   Thornton Papas, MD  08/26/2012  1:38 PM

## 2012-08-26 NOTE — Progress Notes (Signed)
Compression sleeve to right arm for chronic lymphedema. Occasional episode of cellulitis that responds to Keflex. 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

## 2012-08-31 ENCOUNTER — Encounter: Payer: Self-pay | Admitting: *Deleted

## 2012-09-08 ENCOUNTER — Encounter: Payer: Self-pay | Admitting: Cardiology

## 2012-09-08 ENCOUNTER — Ambulatory Visit (INDEPENDENT_AMBULATORY_CARE_PROVIDER_SITE_OTHER): Payer: 59 | Admitting: Cardiology

## 2012-09-08 VITALS — BP 116/64 | HR 80 | Ht 60.0 in | Wt 143.0 lb

## 2012-09-08 DIAGNOSIS — Z5189 Encounter for other specified aftercare: Secondary | ICD-10-CM

## 2012-09-08 DIAGNOSIS — Z9221 Personal history of antineoplastic chemotherapy: Secondary | ICD-10-CM

## 2012-09-08 DIAGNOSIS — I493 Ventricular premature depolarization: Secondary | ICD-10-CM

## 2012-09-08 DIAGNOSIS — E785 Hyperlipidemia, unspecified: Secondary | ICD-10-CM

## 2012-09-08 DIAGNOSIS — I4949 Other premature depolarization: Secondary | ICD-10-CM

## 2012-09-08 NOTE — Patient Instructions (Signed)
Your physician wants you to follow-up in: 1 year with Dr Shirlee Latch. (May 2015).  You will receive a reminder letter in the mail two months in advance. If you don't receive a letter, please call our office to schedule the follow-up appointment.   Your physician has requested that you have an echocardiogram. Echocardiography is a painless test that uses sound waves to create images of your heart. It provides your doctor with information about the size and shape of your heart and how well your heart's chambers and valves are working. This procedure takes approximately one hour. There are no restrictions for this procedure. May 2015 a few days before your appointment with Dr Shirlee Latch.

## 2012-09-10 NOTE — Progress Notes (Signed)
Patient ID: Cheryl [name], female   DOB: June 22, 1950, 62 y.o.   MRN: [id number] PCP: Dr. Jacky Kindle  62 yo with history of PVCs and hyperlipidemia presents for cardiology followup.  She had a stress echo done in 3/12.  It was submaximal but she did reach stage 4 and there were no regional wall motion abnormalities.  She walks about 1/2 hour twice a day with her dog.  No exertional chest pain or dyspnea.  She has chronic lymphedema in her right arm.  She has only rare palpitations on propranolol.   She does, of note, have a history of Adriamycin use.   ECG: NSR, LVH  Labs (1/13): LDL 158, HDL 70, TSH normal, K 4.4, creatinine 0.8 Labs (1/14): K 4.6, creatinine 0.76, LDL 123, HDL 82  PMH: 1. 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 Stress echo (3/12): Normal submaximal stress echo (reached stage 4). 3. Hyperlipidemia 4. Breast cancer: Diagnosed late '90s.  She had mastectomy with development of chronic lymphedema in the right arm.  She had adriamycin in 1999.  5. Migraines 6. 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  Nonsmoker.   FH: Father with CABG in his 72s. Hyperlipidemia.   Current Outpatient Prescriptions  Medication Sig Dispense Refill  . aspirin 81 MG tablet Take 81 mg by mouth daily. Three times a week      . cephALEXin (KEFLEX) 500 MG capsule Take 500 mg by mouth as needed.      . CRESTOR 20 MG tablet TAKE 1 TABLET BY MOUTH AT BEDTIME.  90 tablet  0  . diclofenac (VOLTAREN) 75 MG EC tablet Take 75 mg by mouth as needed.      . ergocalciferol (VITAMIN D2) 50000 UNITS capsule Take 50,000 Units by mouth daily.       . propranolol (INDERAL LA) 80 MG 24 hr capsule Take 80 mg by mouth daily.      . rizatriptan (MAXALT) 10 MG tablet Take 10 mg by mouth as needed. May repeat in 2 hours if needed      . [DISCONTINUED] ezetimibe-simvastatin (VYTORIN) 10-80 MG per tablet Take 1 tablet by mouth at bedtime.         No current facility-administered medications for this visit.    BP 116/64  Pulse 80  Ht 5' (1.524 m)  Wt 143 lb (64.864 kg)  BMI 27.93 kg/m2  SpO2 98% General: NAD Neck: No JVD, no thyromegaly or thyroid nodule.  Lungs: Clear to auscultation bilaterally with normal respiratory effort. CV: Nondisplaced PMI.  Heart regular S1/S2, no S3/S4, no murmur.  No peripheral edema.  No carotid bruit.  Normal pedal pulses.  Abdomen: Soft, nontender, no hepatosplenomegaly, no distention.  Neurologic: Alert and oriented x 3.  Psych: Normal affect. Extremities: No clubbing or cyanosis.   Assessment/Plan: 1. PVCs: These are reasonably suppressed on propranolol. 2. Hyperlipidemia: LDL is better on Crestor but still not as low as I would have expected. 3. History of Adriamycin use: Repeat echo next year.    Marca Ancona 09/10/2012

## 2013-02-08 ENCOUNTER — Ambulatory Visit
Admission: RE | Admit: 2013-02-08 | Discharge: 2013-02-08 | Disposition: A | Discharge status: Home / Self Care | Payer: 59 | Source: Ambulatory Visit | Attending: Oncology | Admitting: Oncology

## 2013-02-08 DIAGNOSIS — C50911 Malignant neoplasm of unspecified site of right female breast: Secondary | ICD-10-CM

## 2013-05-11 ENCOUNTER — Encounter: Payer: Self-pay | Admitting: Cardiology

## 2013-08-30 ENCOUNTER — Ambulatory Visit (HOSPITAL_BASED_OUTPATIENT_CLINIC_OR_DEPARTMENT_OTHER): Payer: 59 | Admitting: Oncology

## 2013-08-30 ENCOUNTER — Telehealth: Payer: Self-pay | Admitting: Oncology

## 2013-08-30 VITALS — BP 131/84 | HR 78 | Temp 99.7°F | Resp 18 | Ht 60.0 in | Wt 145.7 lb

## 2013-08-30 DIAGNOSIS — Z853 Personal history of malignant neoplasm of breast: Secondary | ICD-10-CM

## 2013-08-30 DIAGNOSIS — C50919 Malignant neoplasm of unspecified site of unspecified female breast: Secondary | ICD-10-CM

## 2013-08-30 DIAGNOSIS — M949 Disorder of cartilage, unspecified: Secondary | ICD-10-CM

## 2013-08-30 DIAGNOSIS — M899 Disorder of bone, unspecified: Secondary | ICD-10-CM

## 2013-08-30 NOTE — Progress Notes (Signed)
  [facility] OFFICE PROGRESS NOTE   Diagnosis: Breast cancer  INTERVAL HISTORY:   Ms. England returns as scheduled. She currently feels well. No palpable change at the right chest wall. A left mammogram 02/09/2013 was negative.  She had an episode of right upper arm and chest wall cellulitis approximately 2 months ago. The cellulitis resolved with Keflex.  Objective:  Vital signs in last 24 hours:  Blood pressure 131/84, pulse 78, temperature 99.7 F (37.6 C), temperature source Oral, resp. rate 18, height 5' (1.524 m), weight 145 lb 11.2 oz (66.089 kg), SpO2 98.00%.    HEENT: Neck without mass Lymphatics: No cervical, supra-clavicular, or axillary nodes Resp: Lungs clear bilaterally Cardio: Regular rate and rhythm GI: No hepatomegaly Vascular: No leg edema, lymphedema sleeve in place at the right arm and hand Breasts: Status post right mastectomy with a TRAM reconstruction. Firm nodular tissue at the medial aspect of the TRAM. Left breast without mass.  Medications: I have reviewed the patient's current medications.  Assessment/Plan: 1.Stage III right-sided breast cancer diagnosed in 1999. She remains in clinical remission. She completed adjuvant Femara therapy at the end of August 2009.  2. Left renal lesion on CT scan in April 2008 - a renal ultrasound confirmed bilateral renal cysts.  3. Osteopenia - she continues vitamin D.  4. 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  5. BRCA2 variant of unclear clinical significance, with a significant family history of breast cancer.  6. History of hematuria - reports being diagnosed with a renal stone by Dr Diona Fanti.     Disposition:  Mr. Gaughran remains in clinical remission from breast cancer. She will continue yearly left breast mammography. She will return for an office visit in one year.  Ladell Pier, MD  08/30/2013  1:21 PM

## 2013-08-30 NOTE — Telephone Encounter (Signed)
Gave pt appt for MD  for 2016 °

## 2013-11-17 ENCOUNTER — Other Ambulatory Visit (HOSPITAL_COMMUNITY): Payer: Self-pay | Admitting: Cardiology

## 2013-11-17 DIAGNOSIS — Z09 Encounter for follow-up examination after completed treatment for conditions other than malignant neoplasm: Secondary | ICD-10-CM

## 2013-11-21 ENCOUNTER — Ambulatory Visit (HOSPITAL_COMMUNITY): Payer: 59 | Attending: Cardiology

## 2013-11-21 ENCOUNTER — Encounter: Payer: Self-pay | Admitting: Cardiology

## 2013-11-21 ENCOUNTER — Ambulatory Visit (INDEPENDENT_AMBULATORY_CARE_PROVIDER_SITE_OTHER): Payer: 59 | Admitting: Cardiology

## 2013-11-21 VITALS — BP 120/72 | HR 60 | Ht 60.0 in | Wt 146.0 lb

## 2013-11-21 DIAGNOSIS — Z09 Encounter for follow-up examination after completed treatment for conditions other than malignant neoplasm: Secondary | ICD-10-CM | POA: Insufficient documentation

## 2013-11-21 DIAGNOSIS — Z853 Personal history of malignant neoplasm of breast: Secondary | ICD-10-CM

## 2013-11-21 DIAGNOSIS — I4949 Other premature depolarization: Secondary | ICD-10-CM | POA: Insufficient documentation

## 2013-11-21 DIAGNOSIS — C50919 Malignant neoplasm of unspecified site of unspecified female breast: Secondary | ICD-10-CM | POA: Insufficient documentation

## 2013-11-21 DIAGNOSIS — E785 Hyperlipidemia, unspecified: Secondary | ICD-10-CM

## 2013-11-21 DIAGNOSIS — Z5189 Encounter for other specified aftercare: Secondary | ICD-10-CM

## 2013-11-21 DIAGNOSIS — I493 Ventricular premature depolarization: Secondary | ICD-10-CM

## 2013-11-21 MED ORDER — ROSUVASTATIN CALCIUM 40 MG PO TABS: 40.0000 mg | ORAL_TABLET | Freq: Every day | ORAL | Status: DC

## 2013-11-21 NOTE — Progress Notes (Signed)
2D Echo completed. 11/21/2013

## 2013-11-21 NOTE — Patient Instructions (Signed)
Increase crestor to 40mg  daily. You can take 2 of your 20mg  tablets daily at the same time and use your current supply.  Your physician recommends that you return for a FASTING lipid profile /liver profile in 2 months.   Your physician wants you to follow-up in: 1 year with Dr Aundra Dubin. (August 2016). You will receive a reminder letter in the mail two months in advance. If you don't receive a letter, please call our office to schedule the follow-up appointment.

## 2013-11-21 NOTE — Progress Notes (Signed)
Patient ID: Cheryl [name], female   DOB: 1950/10/14, 63 y.o.   MRN: [id number] PCP: Dr. Reynaldo Minium  63 yo with history of PVCs and hyperlipidemia presents for cardiology followup.  She had a stress echo done in 3/12.  It was submaximal but she did reach stage 4 and there were no regional wall motion abnormalities.  She walks about 1/2 hour twice a day with her dog.  No exertional chest pain or dyspnea.  She has chronic lymphedema in her right arm.  She has only rare palpitations on propranolol.   She does, of note, have a history of Adriamycin use.   Echo in the office today shows normal LV systolic function, no significant valvular abnormalities.  Her LDL continues to run quite high despite Crestor 20 mg daily.   ECG: NSR, normal  Labs (1/13): LDL 158, HDL 70, TSH normal, K 4.4, creatinine 0.8 Labs (1/14): K 4.6, creatinine 0.76, LDL 123, HDL 82 Labs (1/15): K 4, creatinine 0.7, LDL 156, HDL 68  PMH: 1. 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 Stress echo (3/12): Normal submaximal stress echo (reached stage 4). 3. 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  4. Breast cancer: Diagnosed late '90s.  She had mastectomy with development of chronic lymphedema in the right arm.  She had adriamycin in 1999.  Echo (8/15) with EF 55-60%, normal RV size and systolic function.   5. Migraines 6. 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  Nonsmoker.   FH: Father with CABG in his 82s. Hyperlipidemia.   Current Outpatient Prescriptions  Medication Sig Dispense Refill  . aspirin 81 MG tablet Take 81 mg by mouth daily. Three times a week      . cephALEXin (KEFLEX) 500 MG capsule Take 500 mg by mouth as needed.      . diclofenac (VOLTAREN) 75 MG EC tablet Take 75 mg by mouth as needed.      . ergocalciferol (VITAMIN D2) 50000 UNITS capsule Take 50,000 Units by mouth daily.       . propranolol (INDERAL LA) 80 MG 24 hr capsule Take 80 mg by mouth daily.      . rizatriptan (MAXALT)  10 MG tablet Take 10 mg by mouth as needed. May repeat in 2 hours if needed      . rosuvastatin (CRESTOR) 40 MG tablet Take 1 tablet (40 mg total) by mouth daily.  30 tablet  3  . [DISCONTINUED] ezetimibe-simvastatin (VYTORIN) 10-80 MG per tablet Take 1 tablet by mouth at bedtime.         No current facility-administered medications for this visit.   BP 120/72  Pulse 60  Ht 5' (1.524 m)  Wt 146 lb (66.225 kg)  BMI 28.51 kg/m2 General: NAD Neck: No JVD, no thyromegaly or thyroid nodule.  Lungs: Clear to auscultation bilaterally with normal respiratory effort. CV: Nondisplaced PMI.  Heart regular S1/S2, no S3/S4, no murmur.  No peripheral edema.  No carotid bruit.  Normal pedal pulses.  Abdomen: Soft, nontender, no hepatosplenomegaly, no distention.  Neurologic: Alert and oriented x 3.  Psych: Normal affect. Extremities: No clubbing or cyanosis.   Assessment/Plan: 1. PVCs: These are reasonably suppressed on propranolol. 2. Hyperlipidemia: LDL remains high despite Crestor 20 daily.  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  I will increase Crestor to 40 mg daily with lipids/LFTs in 2 months.  Can consider use of PCSK9 inhibitor in the future.  3. History of Adriamycin use: Echo with  normal EF today.     Loralie Champagne 11/21/2013

## 2014-02-06 ENCOUNTER — Ambulatory Visit (INDEPENDENT_AMBULATORY_CARE_PROVIDER_SITE_OTHER): Payer: 59 | Admitting: Podiatry

## 2014-02-06 ENCOUNTER — Other Ambulatory Visit (INDEPENDENT_AMBULATORY_CARE_PROVIDER_SITE_OTHER): Payer: 59 | Admitting: *Deleted

## 2014-02-06 ENCOUNTER — Ambulatory Visit: Payer: 59

## 2014-02-06 ENCOUNTER — Encounter: Payer: Self-pay | Admitting: Podiatry

## 2014-02-06 ENCOUNTER — Ambulatory Visit (INDEPENDENT_AMBULATORY_CARE_PROVIDER_SITE_OTHER): Payer: 59

## 2014-02-06 VITALS — BP 148/84 | HR 65 | Resp 16

## 2014-02-06 DIAGNOSIS — M779 Enthesopathy, unspecified: Secondary | ICD-10-CM

## 2014-02-06 DIAGNOSIS — M79671 Pain in right foot: Secondary | ICD-10-CM

## 2014-02-06 DIAGNOSIS — B351 Tinea unguium: Secondary | ICD-10-CM

## 2014-02-06 DIAGNOSIS — M7751 Other enthesopathy of right foot: Secondary | ICD-10-CM

## 2014-02-06 DIAGNOSIS — Z5189 Encounter for other specified aftercare: Secondary | ICD-10-CM

## 2014-02-06 DIAGNOSIS — M778 Other enthesopathies, not elsewhere classified: Secondary | ICD-10-CM

## 2014-02-06 DIAGNOSIS — M21611 Bunion of right foot: Secondary | ICD-10-CM

## 2014-02-06 DIAGNOSIS — E785 Hyperlipidemia, unspecified: Secondary | ICD-10-CM

## 2014-02-06 DIAGNOSIS — M2011 Hallux valgus (acquired), right foot: Secondary | ICD-10-CM

## 2014-02-06 DIAGNOSIS — M2041 Other hammer toe(s) (acquired), right foot: Secondary | ICD-10-CM

## 2014-02-06 DIAGNOSIS — I4949 Other premature depolarization: Secondary | ICD-10-CM

## 2014-02-06 LAB — HEPATIC FUNCTION PANEL
ALBUMIN: 3.9 g/dL (ref 3.5–5.2)
ALT: 24 U/L (ref 0–35)
AST: 28 U/L (ref 0–37)
Alkaline Phosphatase: 75 U/L (ref 39–117)
Bilirubin, Direct: 0 mg/dL (ref 0.0–0.3)
Total Bilirubin: 0.8 mg/dL (ref 0.2–1.2)
Total Protein: 8.1 g/dL (ref 6.0–8.3)

## 2014-02-06 LAB — LIPID PANEL
CHOLESTEROL: 216 mg/dL — AB (ref 0–200)
HDL: 60.1 mg/dL (ref >39.00)
LDL Cholesterol: 133 mg/dL — ABNORMAL HIGH (ref 0–99)
NonHDL: 155.9
Total CHOL/HDL Ratio: 4
Triglycerides: 114 mg/dL (ref 0.0–149.0)
VLDL: 22.8 mg/dL (ref 0.0–40.0)

## 2014-02-06 NOTE — Progress Notes (Signed)
Subjective:     Patient ID: Cheryl [name], female   DOB: 1950-05-17, 63 y.o.   MRN: [id number]  Foot Pain   patient presents stating I know I need to get my right foot fixed like my left one years ago and I have awful nails on both feet which are sore and makes it hard for me to wear shoe gear comfortably. My foot is getting worse and increasingly hard to wear shoe gear with   Review of Systems  All other systems reviewed and are negative.      Objective:   Physical Exam  Nursing note and vitals reviewed. Constitutional: She is oriented to person, place, and time.  Cardiovascular: Intact distal pulses.   Musculoskeletal: Normal range of motion.  Neurological: She is oriented to person, place, and time.  Skin: Skin is warm.   neurovascular status intact with muscle strength adequate and range of motion of the subtalar and midtarsal joint within normal limits. 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 Well-healed scars left from previous surgery and severe nail disease hallux and second right hallux and third left    Plan:     H&P and x-rays reviewed. Condition discussed and recommendations given to patient. I would recommend going ahead and correcting this and I explained aggressive Austin-type osteotomy right along with digital fusion second and third toes and shortening osteotomy second metatarsal right foot. Also remove several nails on the left foot at the same time and then several weeks  after the initial surgery we will remove the final 2 nails on the right foot. She is scheduled for December for surgery and education rendered to patient and today I debrided nailbeds 1 to right 13 left with no iatrogenic bleeding noted

## 2014-02-06 NOTE — Progress Notes (Signed)
   Subjective:    Patient ID: Cheryl [name], female    DOB: 1950/06/28, 63 y.o.   MRN: [id number]  HPI  Pt presents with bilateral great nail discoloration and thickness, also presents with right foot bunion deformation, denies any pain at the moment  Review of Systems  Neurological: Positive for headaches.  All other systems reviewed and are negative.      Objective:   Physical Exam        Assessment & Plan:

## 2014-02-13 ENCOUNTER — Ambulatory Visit
Admission: RE | Admit: 2014-02-13 | Discharge: 2014-02-13 | Disposition: A | Discharge status: Home / Self Care | Payer: 59 | Source: Ambulatory Visit | Attending: Oncology | Admitting: Oncology

## 2014-02-13 ENCOUNTER — Other Ambulatory Visit: Payer: Self-pay | Admitting: *Deleted

## 2014-02-13 DIAGNOSIS — C50919 Malignant neoplasm of unspecified site of unspecified female breast: Secondary | ICD-10-CM

## 2014-02-13 DIAGNOSIS — E785 Hyperlipidemia, unspecified: Secondary | ICD-10-CM

## 2014-02-13 MED ORDER — EZETIMIBE 10 MG PO TABS: 10.0000 mg | ORAL_TABLET | Freq: Every day | ORAL | Status: DC

## 2014-03-09 ENCOUNTER — Telehealth: Payer: Self-pay | Admitting: *Deleted

## 2014-03-09 NOTE — Telephone Encounter (Signed)
Patient called stating that she hasn't gotten confirmation about her surgery date. Was told Dec. 22nd. Please call to confirm.

## 2014-03-09 NOTE — Telephone Encounter (Signed)
Forwarded message to [facility] for confirmation of surgery date.

## 2014-03-13 NOTE — Telephone Encounter (Signed)
I called the patient and informed her that we have her scheduled for 04/11/2014.  "I was calling because I was told that my surgery wouldn't be covered unless I had it done at [facility].  So I wanted to make sure we have it scheduled."  I told her that [facility] does cover at [facility], the surgical center matches what [facility] would charge.  I her I could get someone to call her.  "That's not necessary, where are they located, at the place on [address]?"  I told her no, they are located on [address] over in the [place] area.  "Oh okay, I'll be in your office next month for an appointment."  I called and spoke to Caren Griffins at [facility] in regards to patient's question about coverage.  She stated she would have someone call the patient.

## 2014-04-04 ENCOUNTER — Ambulatory Visit: Payer: 59 | Admitting: Podiatry

## 2014-04-05 ENCOUNTER — Ambulatory Visit (INDEPENDENT_AMBULATORY_CARE_PROVIDER_SITE_OTHER): Payer: 59 | Admitting: Podiatry

## 2014-04-05 VITALS — BP 125/72 | HR 82 | Resp 16

## 2014-04-05 DIAGNOSIS — M2041 Other hammer toe(s) (acquired), right foot: Secondary | ICD-10-CM

## 2014-04-05 DIAGNOSIS — M2011 Hallux valgus (acquired), right foot: Secondary | ICD-10-CM

## 2014-04-05 DIAGNOSIS — M21611 Bunion of right foot: Secondary | ICD-10-CM

## 2014-04-05 DIAGNOSIS — B351 Tinea unguium: Secondary | ICD-10-CM

## 2014-04-05 NOTE — Progress Notes (Signed)
Subjective:     Patient ID: Cheryl [name], female   DOB: 1950/11/14, 63 y.o.   MRN: [id number]  HPI patient presents stating I'm still getting pain in my right foot and I know I need it fixed and also these nails on my left are bothering me and I know on the the nails removed on the right. Patient has severe structural deformity right with the left having been corrected several years ago   Review of Systems     Objective:   Physical Exam Neurovascular status intact good digital perfusion with patient being well oriented. 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 Nail disease of several nails right and the first and third nails left    Plan:     Reviewed all conditions and allow patient to read consent form for correction. Spent a great deal of time going over all possible complications of problems and everything as outlined in the consent form patient wants surgery understanding complications and at this time I signed consent form after extensive review going over everything as listed and the fact total recovery. We'll take upwards of 6 months to one year and that there is no long-term guarantees. Patient wants surgery signed consent form for correction and will have done at the surgical center in the next week and was dispensed air fracture walker with instructions on usage

## 2014-04-11 DIAGNOSIS — M2041 Other hammer toe(s) (acquired), right foot: Secondary | ICD-10-CM

## 2014-04-11 DIAGNOSIS — M21541 Acquired clubfoot, right foot: Secondary | ICD-10-CM

## 2014-04-11 DIAGNOSIS — L600 Ingrowing nail: Secondary | ICD-10-CM

## 2014-04-11 DIAGNOSIS — M2011 Hallux valgus (acquired), right foot: Secondary | ICD-10-CM

## 2014-04-17 ENCOUNTER — Encounter: Payer: Self-pay | Admitting: Podiatry

## 2014-04-17 ENCOUNTER — Ambulatory Visit: Payer: Self-pay

## 2014-04-17 ENCOUNTER — Ambulatory Visit (INDEPENDENT_AMBULATORY_CARE_PROVIDER_SITE_OTHER): Payer: 59

## 2014-04-17 ENCOUNTER — Ambulatory Visit (INDEPENDENT_AMBULATORY_CARE_PROVIDER_SITE_OTHER): Payer: 59 | Admitting: Podiatry

## 2014-04-17 DIAGNOSIS — M2041 Other hammer toe(s) (acquired), right foot: Secondary | ICD-10-CM

## 2014-04-17 DIAGNOSIS — M2011 Hallux valgus (acquired), right foot: Secondary | ICD-10-CM

## 2014-04-17 NOTE — Progress Notes (Signed)
Subjective:     Patient ID: Cheryl [name], female   DOB: 11-Jul-1950, 63 y.o.   MRN: [id number]  HPI patient states I'm doing really well with my right foot and the nails on my left arm bothering me some especially the third that they seem to be improving. I'm walking well on the right foot with minimal discomfort or swelling   Review of Systems     Objective:   Physical Exam 6 days after extensive forefoot surgery right with clinically everything looking good as far as position with wound edges well coapted second and third toes in good alignment with pins in place and structural bunion correction noted. Nails on the left are somewhat stress to remove the hallux and third nail but it does appear healing is encouraged and there is no proximal edema erythema or drainage    Assessment:     Healing well post forefoot reconstruction right and nail removal first and third left    Plan:     H&P and conditions discussed. Negative Homan sign was noted sterile dressing reapplied to the right and instructed on continued complete immobilization and continue soaks on the left with reappoint in 2 weeks to reevaluate the right foot and removed stitches with consideration for removal of several nails. Reappoint in 2 weeks to reevaluate

## 2014-04-18 ENCOUNTER — Other Ambulatory Visit (INDEPENDENT_AMBULATORY_CARE_PROVIDER_SITE_OTHER): Payer: 59 | Admitting: *Deleted

## 2014-04-18 DIAGNOSIS — E785 Hyperlipidemia, unspecified: Secondary | ICD-10-CM

## 2014-04-18 LAB — LIPID PANEL
Cholesterol: 168 mg/dL (ref 0–200)
HDL: 62.6 mg/dL (ref >39.00)
LDL CALC: 79 mg/dL (ref 0–99)
NonHDL: 105.4
TRIGLYCERIDES: 132 mg/dL (ref 0.0–149.0)
Total CHOL/HDL Ratio: 3
VLDL: 26.4 mg/dL (ref 0.0–40.0)

## 2014-04-18 LAB — HEPATIC FUNCTION PANEL
ALK PHOS: 66 U/L (ref 39–117)
ALT: 20 U/L (ref 0–35)
AST: 22 U/L (ref 0–37)
Albumin: 4.1 g/dL (ref 3.5–5.2)
BILIRUBIN DIRECT: 0.1 mg/dL (ref 0.0–0.3)
Total Bilirubin: 1.1 mg/dL (ref 0.2–1.2)
Total Protein: 7.7 g/dL (ref 6.0–8.3)

## 2014-05-01 ENCOUNTER — Ambulatory Visit: Payer: Self-pay

## 2014-05-01 ENCOUNTER — Ambulatory Visit (INDEPENDENT_AMBULATORY_CARE_PROVIDER_SITE_OTHER): Payer: 59 | Admitting: Podiatry

## 2014-05-01 DIAGNOSIS — Z9889 Other specified postprocedural states: Secondary | ICD-10-CM

## 2014-05-03 ENCOUNTER — Encounter: Payer: Self-pay | Admitting: Podiatry

## 2014-05-03 NOTE — Progress Notes (Unsigned)
DOS 04/11/2014 right austin bunionectomy, right 2nd metatarsal osteotomy, right 2, 3 hammer toe fusion repair.

## 2014-05-03 NOTE — Progress Notes (Signed)
Subjective:     Patient ID: Camillo Flaming Nagy, female   DOB: [birth year], 64 y.o.   MRN: [id number]  HPI Asian presents for suture removal stating she's doing fine and that her third toenail left is gradually getting better but is still slightly red   Review of Systems     Objective:   Physical Exam Ulis Rias status intact negative Homans sign noted with wound edges coapted well and good alignment of the hallux right second and third toes with pin in place and stitches intact. Third toe left continues to exhibit drainage but it's localized with no proximal edema erythema noted    Assessment:     Healing well post forefoot reconstruction right and also nail removal hallux and third left with some stress on the third digit left which appears to be improving    Plan:     Stitches removed right and sterile dressing reapplied and continue with immobilization and reduced activity. For the left continue soaks and it should gradually get better and reappoint 2 weeks for pin removal right and consideration of removal of 2 toenails on the right foot. Overall doing well and will be seen back earlier if any issues should occur

## 2014-05-15 ENCOUNTER — Ambulatory Visit: Payer: Self-pay

## 2014-05-15 ENCOUNTER — Encounter: Payer: Self-pay | Admitting: Podiatry

## 2014-05-15 ENCOUNTER — Ambulatory Visit (INDEPENDENT_AMBULATORY_CARE_PROVIDER_SITE_OTHER): Payer: 59 | Admitting: Podiatry

## 2014-05-15 VITALS — BP 130/88 | HR 78 | Resp 16

## 2014-05-15 DIAGNOSIS — Z9889 Other specified postprocedural states: Secondary | ICD-10-CM

## 2014-05-15 DIAGNOSIS — M2041 Other hammer toe(s) (acquired), right foot: Secondary | ICD-10-CM

## 2014-05-17 NOTE — Progress Notes (Signed)
Subjective:     Patient ID: Cheryl [name], female   DOB: [birth year], 64 y.o.   MRN: [id number]  HPI patient states that I'm doing fine but I'm ready to get the pins out of my right and this third toe on my left is improving   Review of Systems     Objective:   Physical Exam Neurovascular status intact with good structural alignment of the right forefoot secondary to foot reconstruction and nail removal hallux and third left with the third toe improving where there was probably some circulatory stress    Assessment:     Reviewed conditions and at this time I discussed the foot and treatments    Plan:     H&P and x-ray reviewed pins removed from the right foot with sterile dressings applied to the end of the toes and digital splint was dispensed. Discussed removal of the hallux and second nail right which we may do it next visit

## 2014-06-05 ENCOUNTER — Encounter: Payer: 59 | Admitting: Podiatry

## 2014-06-12 ENCOUNTER — Ambulatory Visit (INDEPENDENT_AMBULATORY_CARE_PROVIDER_SITE_OTHER): Payer: 59

## 2014-06-12 ENCOUNTER — Ambulatory Visit (INDEPENDENT_AMBULATORY_CARE_PROVIDER_SITE_OTHER): Payer: 59 | Admitting: Podiatry

## 2014-06-12 VITALS — BP 107/66 | HR 81 | Resp 16

## 2014-06-12 DIAGNOSIS — Z9889 Other specified postprocedural states: Secondary | ICD-10-CM

## 2014-06-12 DIAGNOSIS — L600 Ingrowing nail: Secondary | ICD-10-CM

## 2014-06-12 DIAGNOSIS — M2011 Hallux valgus (acquired), right foot: Secondary | ICD-10-CM

## 2014-06-12 NOTE — Patient Instructions (Signed)

## 2014-06-13 NOTE — Progress Notes (Signed)
Subjective:     Patient ID: Cheryl [name], female   DOB: 1950-11-04, 64 y.o.   MRN: [id number]  HPI patient states I'm doing well with my right foot but I'm ready to have these toenails removed and my left foot is doing better after nail removal. Patient is just starting to return to tennis shoes on the right foot and states she's very happy with the structure   Review of Systems     Objective:   Physical Exam Neurovascular status intact with muscle strength adequate and range of motion within normal limits. Patient is noted to have good alignment and good range of motion and does have extremely thickened hallux and second nail beds right foot that are damaged and painful when pressed. Wound edges are well coapted and alignment is good clinically    Assessment:     Doing well postoperatively with damage to the hallux and second nail beds right that are painful and well structured surgical sites    Plan:     Reviewed x-rays and recommended nail removal. Patient wants the surgery and today I explained the risk and infiltrated the right hallux and second toes with 120 mg Xylocaine Marcaine mixture and then remove the hallux nail right second nail right exposed the matrix and applied phenol for applications 30 seconds followed by alcohol lavage and sterile dressing. Gave instructions on soaks and reappoint

## 2014-07-12 ENCOUNTER — Other Ambulatory Visit: Payer: Self-pay | Admitting: Cardiology

## 2014-08-14 ENCOUNTER — Ambulatory Visit (INDEPENDENT_AMBULATORY_CARE_PROVIDER_SITE_OTHER): Payer: 59 | Admitting: Podiatry

## 2014-08-14 ENCOUNTER — Encounter: Payer: Self-pay | Admitting: Podiatry

## 2014-08-14 ENCOUNTER — Ambulatory Visit: Payer: Self-pay

## 2014-08-14 DIAGNOSIS — Z9889 Other specified postprocedural states: Secondary | ICD-10-CM

## 2014-08-14 NOTE — Patient Instructions (Signed)

## 2014-08-17 NOTE — Progress Notes (Signed)
Subjective:     Patient ID: Cheryl [name], female   DOB: Jan 08, 1951, 64 y.o.   MRN: [id number]  HPI patient presents I'm still getting some numbness in my foot and I wanted to get final x-rays but overall it feels much better   Review of Systems     Objective:   Physical Exam Neurovascular status intact with well structured right foot secondary to forefoot reconstruction with nails that of crusted over secondary to nail removal    Assessment:     Good alignment clinically of the right foot with good range of motion first MPJ and no crepitus within the joint    Plan:     Reviewed final x-rays explaining numbness can be normal for 6 months to a year and that she should continue to improve. Allowed to return to normal activity as tolerated and reappoint as needed

## 2014-09-01 ENCOUNTER — Telehealth: Payer: Self-pay | Admitting: *Deleted

## 2014-09-01 ENCOUNTER — Ambulatory Visit (HOSPITAL_BASED_OUTPATIENT_CLINIC_OR_DEPARTMENT_OTHER): Payer: 59 | Admitting: Oncology

## 2014-09-01 ENCOUNTER — Telehealth: Payer: Self-pay | Admitting: Oncology

## 2014-09-01 VITALS — BP 147/77 | Temp 98.8°F | Resp 18 | Ht 60.0 in | Wt 146.8 lb

## 2014-09-01 DIAGNOSIS — M8580 Other specified disorders of bone density and structure, unspecified site: Secondary | ICD-10-CM | POA: Diagnosis not present

## 2014-09-01 DIAGNOSIS — C50911 Malignant neoplasm of unspecified site of right female breast: Secondary | ICD-10-CM

## 2014-09-01 DIAGNOSIS — Z853 Personal history of malignant neoplasm of breast: Secondary | ICD-10-CM | POA: Diagnosis not present

## 2014-09-01 NOTE — Progress Notes (Signed)
  Cheryl [name] OFFICE PROGRESS NOTE   Diagnosis: Breast cancer   INTERVAL HISTORY:   Cheryl [name] returns as scheduled. A left mammogram in October 2015 was negative. She feels well. She had right foot surgery in December 2015. She last had an episode of cellulitis approximately 1 year ago. The right arm remains swollen. She wears a sleeve.  Objective:  Vital signs in last 24 hours:  Blood pressure 147/77, temperature 98.8 F (37.1 C), temperature source Oral, resp. rate 18, height 5' (1.524 m), weight 146 lb 12.8 oz (66.588 kg), SpO2 92 %.    HEENT: Neck without mass Lymphatics: No cervical, supraclavicular, or axillary nodes Resp: Lungs clear bilaterally Cardio: Regular rate and rhythm GI: No hepatomegaly Vascular: No leg edema, mild edema of the right arm and hand with a lymphedema sleeve in place Breast: Status post right mastectomy with a TRAM reconstruction. Firm nodular tissue at the medial aspect of the TRAM. Left breast without mass.     Medications: I have reviewed the patient's current medications.  Assessment/Plan: 1.Stage III right-sided breast cancer diagnosed in 1999. She remains in clinical remission. She completed adjuvant Femara therapy at the end of August 2009.  2. Left renal lesion on CT scan in April 2008 - a renal ultrasound confirmed bilateral renal cysts.  3. Osteopenia - she continues vitamin D.  4. 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  5. BRCA2 variant of unclear clinical significance, with a significant family history of breast cancer.  6. History of hematuria - reports being diagnosed with a renal stone by Dr Diona Fanti.   Disposition:  Cheryl [name] remains in clinical remission from breast cancer. She will continue yearly mammography. She would like to continue follow-up at the [facility]. She will return for an office visit in one year. She will contact us in the interim for new symptoms.  Betsy Coder,  MD  09/01/2014  11:24 AM

## 2014-09-01 NOTE — Telephone Encounter (Signed)
Pt requested a new Rx for lymphedema compression sleeve. Called "[facility]" they are closed this week. Pt requests the same compression as last Rx.

## 2014-09-01 NOTE — Telephone Encounter (Signed)
per pof to sch pt appt-gave pt copy of sch °

## 2014-09-05 ENCOUNTER — Other Ambulatory Visit: Payer: Self-pay | Admitting: *Deleted

## 2014-09-05 DIAGNOSIS — C50911 Malignant neoplasm of unspecified site of right female breast: Secondary | ICD-10-CM

## 2014-10-16 ENCOUNTER — Other Ambulatory Visit: Payer: Self-pay

## 2015-02-01 ENCOUNTER — Other Ambulatory Visit: Payer: Self-pay

## 2015-02-01 DIAGNOSIS — Z1231 Encounter for screening mammogram for malignant neoplasm of breast: Secondary | ICD-10-CM

## 2015-02-01 DIAGNOSIS — Z9011 Acquired absence of right breast and nipple: Secondary | ICD-10-CM

## 2015-02-05 ENCOUNTER — Ambulatory Visit: Payer: 59 | Admitting: Gynecology

## 2015-02-07 ENCOUNTER — Other Ambulatory Visit (HOSPITAL_COMMUNITY)
Admission: RE | Admit: 2015-02-07 | Discharge: 2015-02-07 | Disposition: A | Discharge status: Home / Self Care | Payer: 59 | Source: Ambulatory Visit | Attending: Gynecology | Admitting: Gynecology

## 2015-02-07 ENCOUNTER — Ambulatory Visit (INDEPENDENT_AMBULATORY_CARE_PROVIDER_SITE_OTHER): Payer: 59 | Admitting: Gynecology

## 2015-02-07 ENCOUNTER — Telehealth: Payer: Self-pay | Admitting: Gynecology

## 2015-02-07 ENCOUNTER — Encounter: Payer: Self-pay | Admitting: Gynecology

## 2015-02-07 VITALS — BP 124/78 | Ht 60.0 in | Wt 144.0 lb

## 2015-02-07 DIAGNOSIS — C50111 Malignant neoplasm of central portion of right female breast: Secondary | ICD-10-CM | POA: Diagnosis not present

## 2015-02-07 DIAGNOSIS — N952 Postmenopausal atrophic vaginitis: Secondary | ICD-10-CM

## 2015-02-07 DIAGNOSIS — Z01419 Encounter for gynecological examination (general) (routine) without abnormal findings: Secondary | ICD-10-CM | POA: Insufficient documentation

## 2015-02-07 DIAGNOSIS — Z9889 Other specified postprocedural states: Secondary | ICD-10-CM

## 2015-02-07 DIAGNOSIS — Z853 Personal history of malignant neoplasm of breast: Secondary | ICD-10-CM

## 2015-02-07 DIAGNOSIS — M8580 Other specified disorders of bone density and structure, unspecified site: Secondary | ICD-10-CM

## 2015-02-07 NOTE — Progress Notes (Signed)
Anis Cinelli Lowenstein 1950-09-23 [id number]        64 y.o.  G0P0 for annual exam.  Former patient of Dr. [name]. Has not been in since 2013.  Several issues noted below.  Past medical history,surgical history, problem list, medications, allergies, family history and social history were all reviewed and documented as reviewed in the EPIC chart.  ROS:  Performed with pertinent positives and negatives included in the history, assessment and plan.   Additional significant findings :  none   Exam: Kim Counsellor Vitals:   02/07/15 1022  BP: 124/78  Height: 5' (1.524 m)  Weight: 144 lb (65.318 kg)   General appearance:  Normal affect, orientation and appearance. Skin: Grossly normal HEENT: Without gross lesions.  No cervical or supraclavicular adenopathy. Thyroid normal.  Lungs:  Clear without wheezing, rales or rhonchi Cardiac: RR, without RMG Abdominal:  Soft, nontender, without masses, guarding, rebound, organomegaly or hernia Breasts:  Examined lying and sitting. Left without masses, retractions, discharge or axillary adenopathy.  Right status post TRAM reconstruction. Firm nodularity along sternal border the patient reports always been there since surgery. No other masses or axillary adenopathy Pelvic:  Ext/BUS/vagina with atrophic changes  Cervix with atrophic changes. Pap smear done  Uterus anteverted, normal size, shape and contour, midline and mobile nontender   Adnexa  Without masses or tenderness    Anus and perineum  Normal   Rectovaginal  Normal sphincter tone without palpated masses or tenderness.    Assessment/Plan:  64 y.o. G0P0 female for annual exam.   1. Postmenopausal/atrophic genital changes. Without significant symptoms of hot flushes, night sweats, vaginal dryness or any vaginal bleeding. Continue to monitor and report any bleeding. 2. History of right breast cancer status post TRAM flap. BRCA 2 mutation of unknown significance with mother with same mutation.   She had discussed with Dr. [name] previously and they did not pursue prophylactic salpingo-oophorectomy. I reviewed with her this was a 2009 study. I'm going to contact the genetic counselors to see if there is any new information or whether they would want to pursue a more expanded genetic screening at this point. Regardless even if all negative the question is whether she would want to proceed with a prophylactic salpingo-oophorectomy for this unknown variant. Patient knows to follow up with me in 2 weeks for further discussion.  Mammography scheduled. SBE monthly reviewed. 3. Pap smear 2012. Pap smear done today. No history of abnormal Pap smears previously. 4. Osteopenia. DEXA 2010 T score -1.7. Reports having been on Fosamax for at least 10 years previously but discontinued by Dr. [name]. We'll check baseline bone density now patientto schedule. 5. Colonoscopy being arranged with Dr. Sarina Ser. 6. Health maintenance. No routine lab work done as this is done at her primary physician's office. Follow up for further discussion about BRCA testing recommendations otherwise annual exam in one year.   Anastasio Auerbach MD, 10:53 AM 02/07/2015

## 2015-02-07 NOTE — Patient Instructions (Signed)
Follow up for bone density as scheduled  Office will contact you in reference to the BRCA testing question  You may obtain a copy of any labs that were done today by logging onto MyChart as outlined in the instructions provided with your AVS (after visit summary). The office will not call with normal lab results but certainly if there are any significant abnormalities then we will contact you.   Health Maintenance Adopting a healthy lifestyle and getting preventive care can go a long way to promote health and wellness. Talk with your health care provider about what schedule of regular examinations is right for you. This is a good chance for you to check in with your provider about disease prevention and staying healthy. In between checkups, there are plenty of things you can do on your own. Experts have done a lot of research about which lifestyle changes and preventive measures are most likely to keep you healthy. Ask your health care provider for more information. WEIGHT AND DIET  Eat a healthy diet  Be sure to include plenty of vegetables, fruits, low-fat dairy products, and lean protein.  Do not eat a lot of foods high in solid fats, added sugars, or salt.  Get regular exercise. This is one of the most important things you can do for your health.  Most adults should exercise for at least 150 minutes each week. The exercise should increase your heart rate and make you sweat (moderate-intensity exercise).  Most adults should also do strengthening exercises at least twice a week. This is in addition to the moderate-intensity exercise.  Maintain a healthy weight  Body mass index (BMI) is a measurement that can be used to identify possible weight problems. It estimates body fat based on height and weight. Your health care provider can help determine your BMI and help you achieve or maintain a healthy weight.  For females 61 years of age and older:   A BMI below 18.5 is considered  underweight.  A BMI of 18.5 to 24.9 is normal.  A BMI of 25 to 29.9 is considered overweight.  A BMI of 30 and above is considered obese.  Watch levels of cholesterol and blood lipids  You should start having your blood tested for lipids and cholesterol at 66 years of age, then have this test every 5 years.  You may need to have your cholesterol levels checked more often if:  Your lipid or cholesterol levels are high.  You are older than 66 years of age.  You are at high risk for heart disease.  CANCER SCREENING   Lung Cancer  Lung cancer screening is recommended for adults 56-43 years old who are at high risk for lung cancer because of a history of smoking.  A yearly low-dose CT scan of the lungs is recommended for people who:  Currently smoke.  Have quit within the past 15 years.  Have at least a 30-pack-year history of smoking. A pack year is smoking an average of one pack of cigarettes a day for 1 year.  Yearly screening should continue until it has been 15 years since you quit.  Yearly screening should stop if you develop a health problem that would prevent you from having lung cancer treatment.  Breast Cancer  Practice breast self-awareness. This means understanding how your breasts normally appear and feel.  It also means doing regular breast self-exams. Let your health care provider know about any changes, no matter how small.  If you are  in your 92s or 30s, you should have a clinical breast exam (CBE) by a health care provider every 1-3 years as part of a regular health exam.  If you are 9 or older, have a CBE every year. Also consider having a breast X-ray (mammogram) every year.  If you have a family history of breast cancer, talk to your health care provider about genetic screening.  If you are at high risk for breast cancer, talk to your health care provider about having an MRI and a mammogram every year.  Breast cancer gene (BRCA) assessment is  recommended for women who have family members with BRCA-related cancers. BRCA-related cancers include:  Breast.  Ovarian.  Tubal.  Peritoneal cancers.  Results of the assessment will determine the need for genetic counseling and BRCA1 and BRCA2 testing. Cervical Cancer Routine pelvic examinations to screen for cervical cancer are no longer recommended for nonpregnant women who are considered low risk for cancer of the pelvic organs (ovaries, uterus, and vagina) and who do not have symptoms. A pelvic examination may be necessary if you have symptoms including those associated with pelvic infections. Ask your health care provider if a screening pelvic exam is right for you.   The Pap test is the screening test for cervical cancer for women who are considered at risk.  If you had a hysterectomy for a problem that was not cancer or a condition that could lead to cancer, then you no longer need Pap tests.  If you are older than 65 years, and you have had normal Pap tests for the past 10 years, you no longer need to have Pap tests.  If you have had past treatment for cervical cancer or a condition that could lead to cancer, you need Pap tests and screening for cancer for at least 20 years after your treatment.  If you no longer get a Pap test, assess your risk factors if they change (such as having a new sexual partner). This can affect whether you should start being screened again.  Some women have medical problems that increase their chance of getting cervical cancer. If this is the case for you, your health care provider may recommend more frequent screening and Pap tests.  The human papillomavirus (HPV) test is another test that may be used for cervical cancer screening. The HPV test looks for the virus that can cause cell changes in the cervix. The cells collected during the Pap test can be tested for HPV.  The HPV test can be used to screen women 67 years of age and older. Getting tested  for HPV can extend the interval between normal Pap tests from three to five years.  An HPV test also should be used to screen women of any age who have unclear Pap test results.  After 66 years of age, women should have HPV testing as often as Pap tests.  Colorectal Cancer  This type of cancer can be detected and often prevented.  Routine colorectal cancer screening usually begins at 66 years of age and continues through 66 years of age.  Your health care provider may recommend screening at an earlier age if you have risk factors for colon cancer.  Your health care provider may also recommend using home test kits to check for hidden blood in the stool.  A small camera at the end of a tube can be used to examine your colon directly (sigmoidoscopy or colonoscopy). This is done to check for the earliest forms  of colorectal cancer.  Routine screening usually begins at age 50.  Direct examination of the colon should be repeated every 5-10 years through 66 years of age. However, you may need to be screened more often if early forms of precancerous polyps or small growths are found. Skin Cancer  Check your skin from head to toe regularly.  Tell your health care provider about any new moles or changes in moles, especially if there is a change in a mole's shape or color.  Also tell your health care provider if you have a mole that is larger than the size of a pencil eraser.  Always use sunscreen. Apply sunscreen liberally and repeatedly throughout the day.  Protect yourself by wearing long sleeves, pants, a wide-brimmed hat, and sunglasses whenever you are outside. HEART DISEASE, DIABETES, AND HIGH BLOOD PRESSURE   Have your blood pressure checked at least every 1-2 years. High blood pressure causes heart disease and increases the risk of stroke.  If you are between 55 years and 79 years old, ask your health care provider if you should take aspirin to prevent strokes.  Have regular  diabetes screenings. This involves taking a blood sample to check your fasting blood sugar level.  If you are at a normal weight and have a low risk for diabetes, have this test once every three years after 66 years of age.  If you are overweight and have a high risk for diabetes, consider being tested at a younger age or more often. PREVENTING INFECTION  Hepatitis B  If you have a higher risk for hepatitis B, you should be screened for this virus. You are considered at high risk for hepatitis B if:  You were born in a country where hepatitis B is common. Ask your health care provider which countries are considered high risk.  Your parents were born in a high-risk country, and you have not been immunized against hepatitis B (hepatitis B vaccine).  You have HIV or AIDS.  You use needles to inject street drugs.  You live with someone who has hepatitis B.  You have had sex with someone who has hepatitis B.  You get hemodialysis treatment.  You take certain medicines for conditions, including cancer, organ transplantation, and autoimmune conditions. Hepatitis C  Blood testing is recommended for:  Everyone born from 1945 through 1965.  Anyone with known risk factors for hepatitis C. Sexually transmitted infections (STIs)  You should be screened for sexually transmitted infections (STIs) including gonorrhea and chlamydia if:  You are sexually active and are younger than 66 years of age.  You are older than 66 years of age and your health care provider tells you that you are at risk for this type of infection.  Your sexual activity has changed since you were last screened and you are at an increased risk for chlamydia or gonorrhea. Ask your health care provider if you are at risk.  If you do not have HIV, but are at risk, it may be recommended that you take a prescription medicine daily to prevent HIV infection. This is called pre-exposure prophylaxis (PrEP). You are considered at  risk if:  You are sexually active and do not regularly use condoms or know the HIV status of your partner(s).  You take drugs by injection.  You are sexually active with a partner who has HIV. Talk with your health care provider about whether you are at high risk of being infected with HIV. If you choose to begin   PrEP, you should first be tested for HIV. You should then be tested every 3 months for as long as you are taking PrEP.  PREGNANCY   If you are premenopausal and you may become pregnant, ask your health care provider about preconception counseling.  If you may become pregnant, take 400 to 800 micrograms (mcg) of folic acid every day.  If you want to prevent pregnancy, talk to your health care provider about birth control (contraception). OSTEOPOROSIS AND MENOPAUSE   Osteoporosis is a disease in which the bones lose minerals and strength with aging. This can result in serious bone fractures. Your risk for osteoporosis can be identified using a bone density scan.  If you are 30 years of age or older, or if you are at risk for osteoporosis and fractures, ask your health care provider if you should be screened.  Ask your health care provider whether you should take a calcium or vitamin D supplement to lower your risk for osteoporosis.  Menopause may have certain physical symptoms and risks.  Hormone replacement therapy may reduce some of these symptoms and risks. Talk to your health care provider about whether hormone replacement therapy is right for you.  HOME CARE INSTRUCTIONS   Schedule regular health, dental, and eye exams.  Stay current with your immunizations.   Do not use any tobacco products including cigarettes, chewing tobacco, or electronic cigarettes.  If you are pregnant, do not drink alcohol.  If you are breastfeeding, limit how much and how often you drink alcohol.  Limit alcohol intake to no more than 1 drink per day for nonpregnant women. One drink equals  12 ounces of beer, 5 ounces of wine, or 1 ounces of hard liquor.  Do not use street drugs.  Do not share needles.  Ask your health care provider for help if you need support or information about quitting drugs.  Tell your health care provider if you often feel depressed.  Tell your health care provider if you have ever been abused or do not feel safe at home. Document Released: 10/21/2010 Document Revised: 08/22/2013 Document Reviewed: 03/09/2013 [facility] Patient Information 2015 [place], [place]. This information is not intended to replace advice given to you by your health care provider. Make sure you discuss any questions you have with your health care provider.

## 2015-02-07 NOTE — Telephone Encounter (Signed)
Tell patient that I reviewed her case with a genetic counselor and that they recommend she be re-counseled as there are new genetic tests available that were not available when she had her original blood work done. Recommend patient make an appointment with Elvina Sidle genetic counselor for follow up counseling and testing as the patient and the counselor decide appropriate.  This may help Cheryl [name] better decide what to do as far as her ovaries and fallopian tubes are concerned.

## 2015-02-07 NOTE — Addendum Note (Signed)
Addended by: Nelva Nay on: 02/07/2015 11:29 AM   Modules accepted: Orders

## 2015-02-08 NOTE — Telephone Encounter (Signed)
Left message for pt to call.

## 2015-02-09 LAB — CYTOLOGY - PAP

## 2015-02-13 NOTE — Telephone Encounter (Signed)
Referral placed they will contact pt to schedule. 

## 2015-02-20 DIAGNOSIS — M8580 Other specified disorders of bone density and structure, unspecified site: Secondary | ICD-10-CM

## 2015-02-20 HISTORY — DX: Other specified disorders of bone density and structure, unspecified site: M85.80

## 2015-02-21 ENCOUNTER — Telehealth: Payer: Self-pay | Admitting: Genetic Counselor

## 2015-02-21 NOTE — Telephone Encounter (Signed)
Lt mess regarding genetic counseling appt

## 2015-02-22 ENCOUNTER — Telehealth: Payer: Self-pay | Admitting: Oncology

## 2015-02-22 NOTE — Telephone Encounter (Signed)
Lt mess regarding genetic counseling appt.

## 2015-02-26 ENCOUNTER — Ambulatory Visit
Admission: RE | Admit: 2015-02-26 | Discharge: 2015-02-26 | Disposition: A | Discharge status: Home / Self Care | Payer: 59 | Source: Ambulatory Visit

## 2015-02-26 DIAGNOSIS — Z9011 Acquired absence of right breast and nipple: Secondary | ICD-10-CM

## 2015-02-26 DIAGNOSIS — Z1231 Encounter for screening mammogram for malignant neoplasm of breast: Secondary | ICD-10-CM

## 2015-02-27 ENCOUNTER — Telehealth: Payer: Self-pay | Admitting: Genetic Counselor

## 2015-02-27 NOTE — Telephone Encounter (Signed)
Returned call and lt mess regarding appt for genetic counseling

## 2015-02-28 ENCOUNTER — Telehealth: Payer: Self-pay | Admitting: Genetic Counselor

## 2015-02-28 NOTE — Telephone Encounter (Signed)
Pt returned call and confirmed appt for genetic testing

## 2015-03-20 ENCOUNTER — Other Ambulatory Visit: Payer: Self-pay | Admitting: Gynecology

## 2015-03-20 ENCOUNTER — Telehealth: Payer: Self-pay | Admitting: Gynecology

## 2015-03-20 ENCOUNTER — Encounter: Payer: Self-pay | Admitting: Gynecology

## 2015-03-20 ENCOUNTER — Ambulatory Visit (INDEPENDENT_AMBULATORY_CARE_PROVIDER_SITE_OTHER): Payer: 59

## 2015-03-20 DIAGNOSIS — Z13820 Encounter for screening for osteoporosis: Secondary | ICD-10-CM | POA: Diagnosis not present

## 2015-03-20 DIAGNOSIS — M899 Disorder of bone, unspecified: Secondary | ICD-10-CM

## 2015-03-20 DIAGNOSIS — M8589 Other specified disorders of bone density and structure, multiple sites: Secondary | ICD-10-CM

## 2015-03-20 DIAGNOSIS — M8580 Other specified disorders of bone density and structure, unspecified site: Secondary | ICD-10-CM

## 2015-03-20 NOTE — Telephone Encounter (Signed)
Tell patient that her bone density does show osteopenia. Difficult to compare to her prior study but given her history of Fosamax use previously I would recommend doing nothing at this point but repeating the bone density in 2 years and will be able to compare it with the same machine at the same facility.

## 2015-03-21 NOTE — Telephone Encounter (Signed)
LEFT MESSAGE FOR PT TO CALL.

## 2015-03-22 NOTE — Telephone Encounter (Signed)
Patient called in voice mail. I called her back and left message to call.

## 2015-03-23 NOTE — Telephone Encounter (Signed)
Patient called. Informed. 

## 2015-04-09 ENCOUNTER — Encounter: Payer: Self-pay | Admitting: Cardiology

## 2015-04-09 ENCOUNTER — Ambulatory Visit (INDEPENDENT_AMBULATORY_CARE_PROVIDER_SITE_OTHER): Payer: 59 | Admitting: Cardiology

## 2015-04-09 VITALS — BP 128/74 | HR 78 | Ht 60.0 in | Wt 150.1 lb

## 2015-04-09 DIAGNOSIS — I493 Ventricular premature depolarization: Secondary | ICD-10-CM | POA: Diagnosis not present

## 2015-04-09 DIAGNOSIS — E785 Hyperlipidemia, unspecified: Secondary | ICD-10-CM

## 2015-04-09 NOTE — Patient Instructions (Signed)
Medication Instructions:  Take aspirin 81mg  daily.  Labwork: None today  Testing/Procedures: None today  Follow-Up: Your physician wants you to follow-up in: 1 year with Dr Aundra Dubin. (December 2017).  You will receive a reminder letter in the mail two months in advance. If you don't receive a letter, please call our office to schedule the follow-up appointment.        If you need a refill on your cardiac medications before your next appointment, please call your pharmacy.

## 2015-04-10 NOTE — Progress Notes (Signed)
Patient ID: Cheryl [name], female   DOB: 20-Oct-1950, 64 y.o.   MRN: [id number] PCP: Dr. Reynaldo Minium  64 yo with history of PVCs and hyperlipidemia presents for cardiology followup.  She had a stress echo done in 3/12.  It was submaximal but she did reach stage 4 and there were no regional wall motion abnormalities.  No exertional chest pain or dyspnea.  She has chronic lymphedema in her right arm.  She has only rare palpitations on propranolol.   She does, of note, have a history of Adriamycin use.   Most recent echo showed normal LV systolic function.  LDL is better with addition of Zetia.  ECG: NSR, LAFB, LAE, LVH  Labs (1/13): LDL 158, HDL 70, TSH normal, K 4.4, creatinine 0.8 Labs (1/14): K 4.6, creatinine 0.76, LDL 123, HDL 82 Labs (1/15): K 4, creatinine 0.7, LDL 156, HDL 68 Labs (5/16): K 4.4, creatinine 0.7, LFTs normal, HCT 43, LDL 87, HDL 75  PMH: 1. 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 Stress echo (3/12): Normal submaximal stress echo (reached stage 4). 3. 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  4. Breast cancer: Diagnosed late '90s.  She had mastectomy with development of chronic lymphedema in the right arm.  She had adriamycin in 1999.  Echo (8/15) with EF 55-60%, normal RV size and systolic function.   5. Migraines 6. 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  Nonsmoker.   FH: Father with CABG in his 81s. Hyperlipidemia.   Current Outpatient Prescriptions  Medication Sig Dispense Refill  . cephALEXin (KEFLEX) 500 MG capsule Take 500 mg by mouth as needed.    . CRESTOR 40 MG tablet TAKE 1 TABLET BY MOUTH ONCE DAILY 30 tablet 5  . ergocalciferol (VITAMIN D2) 50000 UNITS capsule Take 50,000 Units by mouth daily. Liquid    . esomeprazole (NEXIUM) 20 MG capsule Take 20 mg by mouth daily at 12 noon.    . propranolol (INDERAL LA) 80 MG 24 hr capsule Take 80 mg by mouth daily.    . rizatriptan (MAXALT) 10 MG tablet Take 10 mg by mouth as needed.  May repeat in 2 hours if needed    . ZETIA 10 MG tablet TAKE 1 TABLET BY MOUTH DAILY. 30 tablet 5  . aspirin EC 81 MG tablet Take 1 tablet (81 mg total) by mouth daily.    . [DISCONTINUED] ezetimibe-simvastatin (VYTORIN) 10-80 MG per tablet Take 1 tablet by mouth at bedtime.       No current facility-administered medications for this visit.   BP 128/74 mmHg  Pulse 78  Ht 5' (1.524 m)  Wt 150 lb 1.9 oz (68.094 kg)  BMI 29.32 kg/m2 General: NAD Neck: No JVD, no thyromegaly or thyroid nodule.  Lungs: Clear to auscultation bilaterally with normal respiratory effort. CV: Nondisplaced PMI.  Heart regular S1/S2, no S3/S4, no murmur.  No peripheral edema.  No carotid bruit.  Normal pedal pulses.  Abdomen: Soft, nontender, no hepatosplenomegaly, no distention.  Neurologic: Alert and oriented x 3.  Psych: Normal affect. Extremities: No clubbing or cyanosis.   Assessment/Plan: 1. PVCs: These are reasonably suppressed on propranolol. 2. Hyperlipidemia: Good LDL most recently on Crestor + Zetia. 3. History of Adriamycin use: Echo in 8/15 with normal EF.      Loralie Champagne 04/10/2015

## 2015-04-26 ENCOUNTER — Other Ambulatory Visit: Payer: Self-pay | Admitting: Cardiology

## 2015-04-26 MED FILL — RIZATRIPTAN 10 MG ODT: 10 | 90 days supply | Qty: 27 | Fill #1

## 2015-04-26 MED FILL — ROSUVASTATIN CALCIUM 40 MG: 40 | 90 days supply | Qty: 90 | Fill #0

## 2015-04-27 ENCOUNTER — Other Ambulatory Visit: Payer: Self-pay | Admitting: Cardiology

## 2015-04-27 MED ORDER — EZETIMIBE 10 MG PO TABS: 10.0000 mg | ORAL_TABLET | Freq: Every day | ORAL | Status: DC

## 2015-04-27 MED FILL — EZETIMIBE 10 MG TABLET: 10 | 90 days supply | Qty: 90 | Fill #0

## 2015-04-30 ENCOUNTER — Encounter: Payer: 59 | Admitting: Genetic Counselor

## 2015-04-30 ENCOUNTER — Other Ambulatory Visit: Payer: 59

## 2015-05-09 DIAGNOSIS — Z0000 Encounter for general adult medical examination without abnormal findings: Secondary | ICD-10-CM | POA: Diagnosis not present

## 2015-05-09 DIAGNOSIS — N281 Cyst of kidney, acquired: Secondary | ICD-10-CM | POA: Diagnosis not present

## 2015-05-21 ENCOUNTER — Other Ambulatory Visit: Payer: 59

## 2015-05-21 ENCOUNTER — Encounter: Payer: Self-pay | Admitting: Genetic Counselor

## 2015-05-21 ENCOUNTER — Ambulatory Visit (HOSPITAL_BASED_OUTPATIENT_CLINIC_OR_DEPARTMENT_OTHER): Payer: 59 | Admitting: Genetic Counselor

## 2015-05-21 DIAGNOSIS — C50911 Malignant neoplasm of unspecified site of right female breast: Secondary | ICD-10-CM

## 2015-05-21 DIAGNOSIS — Z803 Family history of malignant neoplasm of breast: Secondary | ICD-10-CM | POA: Insufficient documentation

## 2015-05-21 DIAGNOSIS — Z315 Encounter for genetic counseling: Secondary | ICD-10-CM | POA: Diagnosis not present

## 2015-05-21 DIAGNOSIS — Z1379 Encounter for other screening for genetic and chromosomal anomalies: Secondary | ICD-10-CM | POA: Insufficient documentation

## 2015-05-21 NOTE — Progress Notes (Signed)
REFERRING PROVIDER: Burnard Bunting, MD [place], [place] [address]   Cheryl Furlong, MD  Betsy Coder, MD  PRIMARY PROVIDER:  Geoffery Lyons, MD  PRIMARY REASON FOR VISIT:  1. Malignant neoplasm of right female breast, unspecified site of breast ([place])   2. Family history of breast cancer   3. Genetic testing      HISTORY OF PRESENT ILLNESS:   Cheryl [name], a 65 y.o. female, was seen for a [facility] cancer genetics consultation at the request of Dr. Phineas Real due to a personal and family history of cancer.  Cheryl [name] presents to clinic today to discuss the possibility of a hereditary predisposition to cancer, genetic testing, and to further clarify her future cancer risks, as well as potential cancer risks for family members.   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 This was treated with unilateral mastectomy.  Her mother was diagnosed with breast cancer the next year, and underwent genetic testing.  Her mother was found to have a BRCA2 VUS.  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    CANCER HISTORY:   No history exists.     HORMONAL RISK FACTORS:  Menarche was at age 64.  First live birth at age N/A.  OCP use for approximately 0 years.  Ovaries intact: yes.  Hysterectomy: no.  Menopausal status: postmenopausal.  HRT use: 0 years. Colonoscopy: no; not examined. Mammogram within the last year: yes. Number of breast biopsies: 1. Up to date with pelvic exams:  no. Any excessive radiation exposure in the past:  no  Past Medical History  Diagnosis Date  . Arthritis   . Breast cancer ([place]) 1999    right breast, Adriamycin Therapy  . Hyperlipidemia   . History of kidney stones   . Chronic acquired lymphedema     rt arm, s/p right breast cancer  . History of migraine   . Ventricular ectopy     chronic  . Strep throat     age 67  . Kidney  problem 1969    history of left kidney bleeding  . Cellulitis   . Osteopenia 02/2015    T score -2.0. Prior DEXA 2010 T score -1.7. FRAX 20%/2.1%. Based upon prior history of Fosamax treatment will hold on treatment now with repeat at 2 year interval for stability  . Family history of breast cancer     Past Surgical History  Procedure Laterality Date  . Tia    . Cystogram    . Right mastectomy  10-27-97    with tram flap  . Right thumb  1/05  . Left thumb  1/06  . Breast surgery    . Foot surgery  2004,2015    Social History   Social History  . Marital Status: Single    Spouse Name: N/A  . Number of Children: N/A  . Years of Education: N/A   Occupational History  . Nurse [employer]   Social History Main Topics  . Smoking status: Never Smoker   . Smokeless tobacco: Never Used  . Alcohol Use: 0.0 oz/week    0 Standard drinks or equivalent per week     Comment: VERY RARE  . Drug Use: No  . Sexual Activity: No     Comment:  [place]   Other Topics Concern  . None   Social History Narrative  FAMILY HISTORY:  We obtained a detailed, 4-generation family history.  Significant diagnoses are listed below: Family History  Problem Relation Age of Onset  . Breast cancer Mother 50    dx 3 times, bilateral  . Heart disease Father     CABG  . Hypertension Father   . Dementia Father   . Hyperlipidemia Sister   . Tuberculosis Paternal Grandmother   . Heart attack Paternal Grandfather   . Breast cancer Other     MGM's sister  . Breast cancer Other     MGMs sister  . Breast cancer Other     MGMs sister  . Prostate cancer Other     MGMs father  . Prostate cancer Other     MGFs father    The patient does not have any children.  She has one sister who is cancer free.  Her mother was diagnosed with breast cancer at ages 34, 29 and 31.  Her mother is an only child.  The patient's maternal grandfather died at age 69 after a prolonged hospital stay with stomach problems.   Her maternal grandmother died at 51.  The patient's grandmother had three sisters and three brothers.  All three sisters died of breast cancer.  The patient's grandmother's father died from prostate cancer, and her grandfather's father had prostate cancer.  Teh patient's father died of complications from dementia at 53 He had two sisters who were cancer free.  There is no other reported cancer in the family.  Patient's maternal ancestors are of [place] and [place] descent, and paternal ancestors are of [place] descent. There is reported Ashkenazi Jewish ancestry, as the patient's paternal grandmother is Jewish. There is no known consanguinity.  GENETIC COUNSELING ASSESSMENT: Cheryl [name] is a 65 y.o. female with a personal history of breast cancer and family history of breast cancer which is somewhat suggestive of a hereditary cancer syndrome and predisposition to cancer. We, therefore, discussed and recommended the following at today's visit.   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  We discussed updated testing, in that we can test for additional hereditary cancer genes.  Based on this testing may allow [place] to follow her differently.  The most common genes we find genetic changes in, outside of BRCA1 and BRCA2 include PALB2, CHEK2 and ATM.  Based on the current findings, we anticipate that the BRCA2 VUS will be found again and reported.  Other genes will also be reviewed. We reviewed the characteristics, features and inheritance patterns of hereditary cancer syndromes. We also discussed genetic testing, including the appropriate family members to test, the process of testing, insurance coverage and turn-around-time for results. We discussed the implications of a negative, positive and/or variant of uncertain significant result. We recommended Cheryl [name] pursue genetic testing for the Breast/Ovarian cancer gene panel. The Breast/Ovarian gene panel offered by  [facility] includes sequencing and rearrangement analysis for the following 20 genes:  ATM, BARD1, BRCA1, BRCA2, BRIP1, CDH1, CHEK2, EPCAM, FANCC, MLH1, MSH2, MSH6, NBN, PALB2, PMS2, PTEN, RAD51C, RAD51D, TP53, and XRCC2.     Based on Cheryl [name]'s personal and family history of cancer, she meets medical criteria for genetic testing. Despite that she meets criteria, she may still have an out of pocket cost. We discussed that if her out of pocket cost for testing is over $100, the laboratory will call and confirm whether she wants to proceed with testing.  If the out of  pocket cost of testing is less than $100 she will be billed by the genetic testing laboratory.   PLAN: After considering the risks, benefits, and limitations, Cheryl [name]  provided informed consent to pursue genetic testing and the blood sample was sent to [facility] for analysis of the Breast/Ovarian cancer panel. Results should be available within approximately 2-3 weeks' time, at which point they will be disclosed by telephone to Cheryl [name], as will any additional recommendations warranted by these results. Cheryl [name] will receive a summary of her genetic counseling visit and a copy of her results once available. This information will also be available in Epic. We encouraged Cheryl [name] to remain in contact with cancer genetics annually so that we can continuously update the family history and inform her of any changes in cancer genetics and testing that may be of benefit for her family. Cheryl [name]'s questions were answered to her satisfaction today. Our contact information was provided should additional questions or concerns arise.  Lastly, we encouraged Cheryl [name] to remain in contact with cancer genetics annually so that we can continuously update the family history and inform her of any changes in cancer genetics and testing that may be of benefit for this family.   Ms.  [name]'s questions were answered to her satisfaction  today. Our contact information was provided should additional questions or concerns arise. Thank you for the referral and allowing [place] to share in the care of your patient.   Claretha Townshend P. Florene Glen, [place], [facility] Certified Genetic Counselor [email or web address] phone: [phone]  The patient was seen for a total of 60 minutes in face-to-face genetic counseling.  This patient was discussed with Drs. Magrinat, Lindi Adie and/or Burr Medico who agrees with the above.    _______________________________________________________________________ For Office Staff:  Number of people involved in session: 2 Was an Intern/ student involved with case: yes

## 2015-06-05 DIAGNOSIS — C50911 Malignant neoplasm of unspecified site of right female breast: Secondary | ICD-10-CM | POA: Diagnosis not present

## 2015-06-05 DIAGNOSIS — Z803 Family history of malignant neoplasm of breast: Secondary | ICD-10-CM | POA: Diagnosis not present

## 2015-06-05 DIAGNOSIS — Z315 Encounter for genetic counseling: Secondary | ICD-10-CM | POA: Diagnosis not present

## 2015-06-18 ENCOUNTER — Telehealth: Payer: Self-pay | Admitting: Genetic Counselor

## 2015-06-18 ENCOUNTER — Ambulatory Visit: Payer: Self-pay | Admitting: Genetic Counselor

## 2015-06-18 DIAGNOSIS — Z1379 Encounter for other screening for genetic and chromosomal anomalies: Secondary | ICD-10-CM

## 2015-06-18 DIAGNOSIS — Z853 Personal history of malignant neoplasm of breast: Secondary | ICD-10-CM

## 2015-06-18 DIAGNOSIS — Z803 Family history of malignant neoplasm of breast: Secondary | ICD-10-CM

## 2015-06-18 NOTE — Progress Notes (Signed)
HPI: Ms. Umbaugh was previously seen in the [facility] clinic due to a personal and family history of cancer and concerns regarding a hereditary predisposition to cancer. Please refer to our prior cancer genetics clinic note for more information regarding Ms. Loyal's medical, social and family histories, and our assessment and recommendations, at the time. Ms. Khim's recent genetic test results were disclosed to her, as were recommendations warranted by these results. These results and recommendations are discussed in more detail below.  FAMILY HISTORY:  We obtained a detailed, 4-generation family history.  Significant diagnoses are listed below: Family History  Problem Relation Age of Onset  . Breast cancer Mother 25    dx 3 times, bilateral  . Heart disease Father     CABG  . Hypertension Father   . Dementia Father   . Hyperlipidemia Sister   . Tuberculosis Paternal Grandmother   . Heart attack Paternal Grandfather   . Breast cancer Other     MGM's sister  . Breast cancer Other     MGMs sister  . Breast cancer Other     MGMs sister  . Prostate cancer Other     MGMs father  . Prostate cancer Other     MGFs father    The patient does not have any children. She has one sister who is cancer free. Her mother was diagnosed with breast cancer at ages 8, 39 and 13. Her mother is an only child. The patient's maternal grandfather died at age 52 after a prolonged hospital stay with stomach problems. Her maternal grandmother died at 81. The patient's grandmother had three sisters and three brothers. All three sisters died of breast cancer. The patient's grandmother's father died from prostate cancer, and her grandfather's father had prostate cancer. Teh patient's father died of complications from dementia at 67 He had two sisters who were cancer free. There is no other reported cancer in the family. Patient's maternal ancestors are of [place] and [place] descent, and  paternal ancestors are of [place] descent. There is reported Ashkenazi Jewish ancestry, as the patient's paternal grandmother is Jewish. There is no known consanguinity.  GENETIC TEST RESULTS: At the time of Ms. Senger's visit, we recommended she pursue genetic testing of the Breast/Ovarian cancer gene panel. The Breast/Ovarian gene panel offered by [facility] includes sequencing and rearrangement analysis for the following 20 genes:  ATM, BARD1, BRCA1, BRCA2, BRIP1, CDH1, CHEK2, EPCAM, FANCC, MLH1, MSH2, MSH6, NBN, PALB2, PMS2, PTEN, RAD51C, RAD51D, TP53, and XRCC2.   The report date is June 15, 2015.  Genetic testing was normal, and did not reveal a deleterious mutation in these genes. The test report has been scanned into EPIC and is located under the Molecular Pathology section of the Results Review tab.   We discussed with Ms. Eckroth that since the current genetic testing is not perfect, it is possible there may be a gene mutation in one of these genes that current testing cannot detect, but that chance is small. We also discussed, that it is possible that another gene that has not yet been discovered, or that we have not yet tested, is responsible for the cancer diagnoses in the family, and it is, therefore, important to remain in touch with cancer genetics in the future so that we can continue to offer Ms. Loyal the most up to date genetic testing.   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 At this time, it is  unknown if this variant is associated with increased cancer risk or if this is a normal finding, but most variants such as this get reclassified to being inconsequential. It should not be used to make medical management decisions. With time, we suspect the lab will determine the significance of this variant, if any. If we do learn more about it, we will try to contact Ms. Nobile to discuss it further. However, it is important to stay in  touch with [place] periodically and keep the address and phone number up to date.   CANCER SCREENING RECOMMENDATIONS:  Given Ms. Prescher's personal and family histories, we must interpret these negative results with some caution.  Families with features suggestive of hereditary risk for cancer tend to have multiple family members with cancer, diagnoses in multiple generations and diagnoses before the age of 76. Ms. Blaney's family exhibits some of these features. Thus this result may simply reflect our current inability to detect all mutations within these genes or there may be a different gene that has not yet been discovered or tested.   RECOMMENDATIONS FOR FAMILY MEMBERS: Women in this family might be at some increased risk of developing cancer, over the general population risk, simply due to the family history of cancer. We recommended women in this family have a yearly mammogram beginning at age 93, or 43 years younger than the earliest onset of cancer, an an annual clinical breast exam, and perform monthly breast self-exams. Women in this family should also have a gynecological exam as recommended by their primary provider. All family members should have a colonoscopy by age 89.  FOLLOW-UP: Lastly, we discussed with Ms. Greenough that cancer genetics is a rapidly advancing field and it is possible that new genetic tests will be appropriate for her and/or her family members in the future. We encouraged her to remain in contact with cancer genetics on an annual basis so we can update her personal and family histories and let her know of advances in cancer genetics that may benefit this family.   Our contact number was provided. Ms. Caylor's questions were answered to her satisfaction, and she knows she is welcome to call us at anytime with additional questions or concerns.   Roma Kayser, MS, [facility] Certified Genetic Counselor [email or web address]

## 2015-06-18 NOTE — Telephone Encounter (Signed)
Revealed negative genetic testing on the breast/ovarian cancer panel.  Brip1 VUS was identified, and is the same on that was found in her mother.  The BRCA2 VUS that was previously identified was also found.  However this lab calls that a benign result.

## 2015-06-20 MED FILL — PROPRANOLOL ER 80 MG CAP: 80 | 90 days supply | Qty: 90 | Fill #0

## 2015-08-15 MED FILL — ROSUVASTATIN CALCIUM 40 MG: 40 | 30 days supply | Qty: 30 | Fill #1

## 2015-08-17 DIAGNOSIS — E784 Other hyperlipidemia: Secondary | ICD-10-CM | POA: Diagnosis not present

## 2015-08-17 DIAGNOSIS — Z79899 Other long term (current) drug therapy: Secondary | ICD-10-CM | POA: Diagnosis not present

## 2015-08-17 DIAGNOSIS — M859 Disorder of bone density and structure, unspecified: Secondary | ICD-10-CM | POA: Diagnosis not present

## 2015-08-29 DIAGNOSIS — M1990 Unspecified osteoarthritis, unspecified site: Secondary | ICD-10-CM | POA: Diagnosis not present

## 2015-08-29 DIAGNOSIS — Z23 Encounter for immunization: Secondary | ICD-10-CM | POA: Diagnosis not present

## 2015-08-29 DIAGNOSIS — G43909 Migraine, unspecified, not intractable, without status migrainosus: Secondary | ICD-10-CM | POA: Diagnosis not present

## 2015-08-29 DIAGNOSIS — E784 Other hyperlipidemia: Secondary | ICD-10-CM | POA: Diagnosis not present

## 2015-08-29 DIAGNOSIS — Z0000 Encounter for general adult medical examination without abnormal findings: Secondary | ICD-10-CM | POA: Diagnosis not present

## 2015-08-29 DIAGNOSIS — C50911 Malignant neoplasm of unspecified site of right female breast: Secondary | ICD-10-CM | POA: Diagnosis not present

## 2015-08-29 DIAGNOSIS — Z6827 Body mass index (BMI) 27.0-27.9, adult: Secondary | ICD-10-CM | POA: Diagnosis not present

## 2015-08-29 DIAGNOSIS — M859 Disorder of bone density and structure, unspecified: Secondary | ICD-10-CM | POA: Diagnosis not present

## 2015-08-29 DIAGNOSIS — I890 Lymphedema, not elsewhere classified: Secondary | ICD-10-CM | POA: Diagnosis not present

## 2015-08-29 MED FILL — PROPRANOLOL ER 80 MG CAP: 80 | 90 days supply | Qty: 90 | Fill #0

## 2015-09-03 ENCOUNTER — Telehealth: Payer: Self-pay | Admitting: Oncology

## 2015-09-03 ENCOUNTER — Ambulatory Visit (HOSPITAL_BASED_OUTPATIENT_CLINIC_OR_DEPARTMENT_OTHER): Payer: Medicare Other | Admitting: Oncology

## 2015-09-03 VITALS — BP 124/72 | HR 68 | Temp 98.2°F | Resp 18 | Ht 60.0 in | Wt 145.7 lb

## 2015-09-03 DIAGNOSIS — C50911 Malignant neoplasm of unspecified site of right female breast: Secondary | ICD-10-CM

## 2015-09-03 DIAGNOSIS — Z853 Personal history of malignant neoplasm of breast: Secondary | ICD-10-CM | POA: Diagnosis not present

## 2015-09-03 DIAGNOSIS — M8580 Other specified disorders of bone density and structure, unspecified site: Secondary | ICD-10-CM

## 2015-09-03 NOTE — Telephone Encounter (Signed)
Gave pt apt & avs °

## 2015-09-03 NOTE — Progress Notes (Signed)
  Cheryl [name] OFFICE PROGRESS NOTE   Diagnosis: Breast cancer  INTERVAL HISTORY:   Cheryl [name] returns as scheduled. She had a negative mammogram 02/26/2015. Persistent right arm edema. She wears a lymphedema sleeve. No recent infection. She saw the genetics counselor and had a negative breast/ovarian cancer gene panel.  Objective:  Vital signs in last 24 hours:  Blood pressure 124/72, pulse 68, temperature 98.2 F (36.8 C), temperature source Oral, resp. rate 18, height 5' (1.524 m), weight 145 lb 11.2 oz (66.089 kg), SpO2 98 %.    HEENT: Neck without mass Lymphatics: No cervical, supraclavicular, or axillary nodes Resp: Lungs clear bilaterally Cardio: Regular rate and rhythm GI: No hepatomegaly Vascular: No leg edema, lymphedema sleeve over the right arm Breasts: Status post right mastectomy with a TRAM reconstruction. Firm tissue at the medial aspect of the TRAM. Left breast without mass.    Medications: I have reviewed the patient's current medications.  Assessment/Plan: 1.Stage III right-sided breast cancer diagnosed in 1999. She remains in clinical remission. She completed adjuvant Femara therapy at the end of August 2009.  2. Left renal lesion on CT scan in April 2008 - a renal ultrasound confirmed bilateral renal cysts.  3. Osteopenia - she continues vitamin D.  4. 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  5. BRCA2 variant of unclear clinical significance, with a significant family history of breast cancer. Negative breast/ovarian gene panel 06/15/2015 6. History of hematuria - reports being diagnosed with a renal stone by Dr Diona Fanti.    Disposition:  Cheryl [name] is in clinical remission from breast cancer. She would like to continue follow[facility] at the [facility]. She will schedule a mammogram for November. She will return for an office visit in one year.  Betsy Coder, MD  09/03/2015  11:37 AM

## 2015-09-07 MED FILL — ZETIA 10 MG TABLET: 10 | 30 days supply | Qty: 30 | Fill #1

## 2015-09-28 MED FILL — ROSUVASTATIN CALCIUM 40 MG: 40 | 30 days supply | Qty: 30 | Fill #2

## 2015-10-15 MED FILL — ZETIA 10 MG TABLET: 10 | 30 days supply | Qty: 30 | Fill #2

## 2015-11-06 MED FILL — ROSUVASTATIN CALCIUM 40 MG: 40 | 30 days supply | Qty: 30 | Fill #3

## 2015-11-28 MED FILL — ZETIA 10 MG TABLET: 10 | 30 days supply | Qty: 30 | Fill #3

## 2015-12-17 MED FILL — PROPRANOLOL ER 80 MG CAP: 80 | 90 days supply | Qty: 90 | Fill #1

## 2015-12-17 MED FILL — ROSUVASTATIN CALCIUM 40 MG: 40 | 30 days supply | Qty: 30 | Fill #4

## 2016-01-05 DIAGNOSIS — Z23 Encounter for immunization: Secondary | ICD-10-CM | POA: Diagnosis not present

## 2016-01-15 MED FILL — ZETIA 10 MG TABLET: 10 | 30 days supply | Qty: 30 | Fill #4

## 2016-01-28 ENCOUNTER — Other Ambulatory Visit: Payer: Self-pay | Admitting: Gynecology

## 2016-01-28 DIAGNOSIS — Z9011 Acquired absence of right breast and nipple: Secondary | ICD-10-CM

## 2016-01-28 DIAGNOSIS — Z1231 Encounter for screening mammogram for malignant neoplasm of breast: Secondary | ICD-10-CM

## 2016-01-31 MED FILL — ROSUVASTATIN CALCIUM 40 MG: 40 | 30 days supply | Qty: 30 | Fill #5

## 2016-02-07 DIAGNOSIS — H2513 Age-related nuclear cataract, bilateral: Secondary | ICD-10-CM | POA: Diagnosis not present

## 2016-02-07 DIAGNOSIS — H25013 Cortical age-related cataract, bilateral: Secondary | ICD-10-CM | POA: Diagnosis not present

## 2016-02-13 ENCOUNTER — Ambulatory Visit (INDEPENDENT_AMBULATORY_CARE_PROVIDER_SITE_OTHER): Payer: Medicare Other | Admitting: Gynecology

## 2016-02-13 ENCOUNTER — Encounter: Payer: Self-pay | Admitting: Gynecology

## 2016-02-13 VITALS — BP 120/76 | Ht 60.0 in | Wt 144.0 lb

## 2016-02-13 DIAGNOSIS — M8580 Other specified disorders of bone density and structure, unspecified site: Secondary | ICD-10-CM | POA: Diagnosis not present

## 2016-02-13 DIAGNOSIS — C50911 Malignant neoplasm of unspecified site of right female breast: Secondary | ICD-10-CM | POA: Diagnosis not present

## 2016-02-13 DIAGNOSIS — Z01419 Encounter for gynecological examination (general) (routine) without abnormal findings: Secondary | ICD-10-CM

## 2016-02-13 DIAGNOSIS — N952 Postmenopausal atrophic vaginitis: Secondary | ICD-10-CM

## 2016-02-13 NOTE — Patient Instructions (Signed)
Office will call you to arrange a consult with the GYN oncologists. Call my office if you do not hear about this within the next several weeks.

## 2016-02-13 NOTE — Progress Notes (Signed)
Cheryl [name] [birth year] [id number]        64 y.o.  G0P0  for breast and pelvic exam. Several issues noted below.  Past medical history,surgical history, problem list, medications, allergies, family history and social history were all reviewed and documented as reviewed in the EPIC chart.  ROS:  Performed with pertinent positives and negatives included in the history, assessment and plan.   Additional significant findings :  None   Exam: Cheryl [name] assistant Vitals:   02/13/16 0850  BP: 120/76  Weight: 144 lb (65.3 kg)  Height: 5' (1.524 m)   Body mass index is 28.12 kg/m.  General appearance:  Normal affect, orientation and appearance. Skin: Grossly normal HEENT: Without gross lesions.  No cervical or supraclavicular adenopathy. Thyroid normal.  Lungs:  Clear without wheezing, rales or rhonchi Cardiac: RR, without RMG Abdominal:  Soft, nontender, without masses, guarding, rebound, organomegaly or hernia Breasts:  Examined lying and sitting. Left without masses, retractions, discharge or axillary adenopathy. Right status post TRAM reconstruction. No new masses, tenderness or axillary adenopathy noted. Pelvic:  Ext, BUS, Vagina with atrophic changes  Cervix with atrophic changes  Uterus anteverted, normal size, shape and contour, midline and mobile nontender   Adnexa without masses or tenderness    Anus and perineum normal   Rectovaginal normal sphincter tone without palpated masses or tenderness.    Assessment/Plan:  65 y.o. G0P0 female for breast and pelvic exam.   1. Postmenopausal/atrophic genital changes. No history of significant hot flushes, night sweats, vaginal dryness or any vaginal bleeding. Continue to monitor report any issues or bleeding. 2. History of right breast cancer status post TRAM. Underwent genetic counseling with expanded gene testing. 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  In the genetic counseling report it is  stated that this should not be used to make medical decisions.  I again reviewed with the patient given her specific situation the options to include continued expectant management with routine screening such as annual mammograms, increasing surveillance to include additional MRI of the breast, ultrasound of the ovaries/CA-125 up to including prophylactic surgery such as mastectomy and salpingo-oophorectomy. Patient is unclear about what is the right choice in her particular case. I suggested that she consult with GYN oncology and get their input. We'll help her make that arrangement. She knows to follow up with me if she does not hear within several weeks. Mammography is due in November and I reminded her to schedule this. SBE monthly reviewed. 3. Osteopenia.  DEXA 02/2015 T score -2.0.  History of Fosamax use for at least 10 years previously but discontinued by Dr. Cherylann [name] several years ago. There was a FRAX calculated at 20%/2.1% although I am unsure the significance of these results given her prior treatment. I reviewed with her options of treatment versus observation. I think given the total picture will plan on observation with repeat DEXA next year at 2 year interval and then go from there. Patient's comfortable with this approach. 4. Colonoscopy scheduled in November and she will follow up for this. 5. Pap smear 2016. No Pap smear done today. No history of abnormal Pap smears previously. 6. Health maintenance. No routine lab work done as this is done elsewhere. Follow up 1 year, sooner as needed.  Greater than 10 minutes of my time in excess of her breast and pelvic exam was spent in direct face to face counseling and coordination of care in regards to her breast  cancer history, genetic testing results and treatment options as well as her osteopenia and management plan review.     Cheryl Auerbach MD, 9:20 AM 02/13/2016

## 2016-02-14 ENCOUNTER — Telehealth: Payer: Self-pay | Admitting: *Deleted

## 2016-02-14 NOTE — Telephone Encounter (Signed)
-----  Message from Anastasio Auerbach, MD sent at 02/13/2016  3:18 PM EDT ----- Schedule a consult appointment with GYN oncologist reference personal history of breast cancer with BRCA variant of unknown significance and question about ongoing surveillance or prophylactic surgeries. My office note should explain

## 2016-02-14 NOTE — Telephone Encounter (Signed)
I called and left a detailed message on voicemail at [facility] oncology regarding appointment. I asked them to call me to schedule.

## 2016-02-14 NOTE — Telephone Encounter (Signed)
Left message for patient to call appointment on 02/27/16 @ 9:00am with Dr.Lesile Loletta Specter

## 2016-02-18 NOTE — Telephone Encounter (Signed)
Pt informed the below note 

## 2016-02-26 NOTE — Progress Notes (Deleted)
GYNECOLOGIC ONCOLOGY NEW PATIENT CONSULTATION  Date of Service: 02/27/16 Referring Provider: *** Consulting Provider: Marcello Fennel. Carlis Abbott, MD  HISTORY OF PRESENT ILLNESS: Cheryl [name] is a 65 y.o. woman who is seen in consultation at the request of *** for evaluation of BRCA2.  ***  PAST MEDICAL HISTORY: Past Medical History:  Diagnosis Date  . Arthritis   . Breast cancer ([place]) 1999   right breast, Adriamycin Therapy  . Cellulitis   . Chronic acquired lymphedema    rt arm, s/p right breast cancer  . History of kidney stones   . History of migraine   . Hyperlipidemia   . Kidney problem 1969   history of left kidney bleeding  . Osteopenia 02/2015   T score -2.0. Prior DEXA 2010 T score -1.7. FRAX 20%/2.1%. Based upon prior history of Fosamax treatment will hold on treatment now with repeat at 2 year interval for stability  . Strep throat    age 27  . Ventricular ectopy    chronic    PAST SURGICAL HISTORY: Past Surgical History:  Procedure Laterality Date  . BREAST SURGERY     Mastectomy with TRAM  . CYSTOGRAM    . FOOT SURGERY  [id number]  . left thumb  1/06  . right mastectomy  10-27-97   with tram flap  . right thumb  1/05  . TONSILLECTOMY AND ADENOIDECTOMY      OB/GYN HISTORY: OB History  Gravida Para Term Preterm AB Living  0            SAB TAB Ectopic Multiple Live Births                    Age at menarche: *** Age at menopause: *** Hx of HRT: *** Hx of STDs: *** Last pap: *** History of abnormal pap smears: ***  SCREENING STUDIES:  Last mammogram: *** Last colonoscopy: ***  MEDICATIONS:  Current Outpatient Prescriptions:  .  aspirin EC 81 MG tablet, Take 1 tablet (81 mg total) by mouth daily., Disp: , Rfl:  .  cephALEXin (KEFLEX) 500 MG capsule, Take 500 mg by mouth as needed., Disp: , Rfl:  .  ergocalciferol (VITAMIN D2) 50000 UNITS capsule, Take 50,000 Units by mouth daily. Liquid, Disp: , Rfl:  .  esomeprazole (NEXIUM) 20 MG capsule, Take 20  mg by mouth daily at 12 noon., Disp: , Rfl:  .  ezetimibe (ZETIA) 10 MG tablet, Take 1 tablet (10 mg total) by mouth daily., Disp: 30 tablet, Rfl: 11 .  propranolol (INDERAL LA) 80 MG 24 hr capsule, Take 80 mg by mouth daily., Disp: , Rfl:  .  rizatriptan (MAXALT) 10 MG tablet, Take 10 mg by mouth as needed. May repeat in 2 hours if needed, Disp: , Rfl:  .  rosuvastatin (CRESTOR) 40 MG tablet, TAKE 1 TABLET BY MOUTH ONCE DAILY, Disp: 30 tablet, Rfl: 11  ALLERGIES: Allergies  Allergen Reactions  . Demerol Hives  . Theophyllines Palpitations    FAMILY HISTORY: Family History  Problem Relation Age of Onset  . Breast cancer Mother 51    dx 3 times, bilateral  . Heart disease Father     CABG  . Hypertension Father   . Dementia Father   . Hyperlipidemia Sister   . Tuberculosis Paternal Grandmother   . Heart attack Paternal Grandfather   . Breast cancer Other     MGM's sister  . Breast cancer Other     MGMs sister  . Breast  cancer Other     MGMs sister  . Prostate cancer Other     MGMs father    SOCIAL HISTORY: Social History   Social History  . Marital status: Single    Spouse name: N/A  . Number of children: N/A  . Years of education: N/A   Occupational History  . Nurse [employer]   Social History Main Topics  . Smoking status: Never Smoker  . Smokeless tobacco: Never Used  . Alcohol use 0.0 oz/week     Comment: VERY RARE  . Drug use: No  . Sexual activity: No     Comment:  [place]   Other Topics Concern  . Not on file   Social History Narrative  . No narrative on file    REVIEW OF SYSTEMS: New patient intake form was reviewed.  Complete 10-system review is negative except for the following: ***  PHYSICAL EXAM: There were no vitals taken for this visit. General: ***Alert, oriented, no acute distress. HEENT: ***Normocephalic, atraumatic.  ***Sclera anicteric, posterior oropharynx clear.  ***Normal dentition. Chest: ***Clear to auscultation  bilaterally. Cardiovascular: ***Regular rate and rhythm, no murmurs, rubs, or gallops. Breasts: ***Symmetric in size and appearance bilaterally, no masses or skin retractions.  No nipple discharge, no axillary adenopathy. Abdomen: ***Obese. ***Normoactive bowel sounds.  ***Soft, nondistended, nontender to palpation.  ***No masses or hepatosplenomegaly appreciated.  ***No evidence of hernia.  ***No palpable fluid wave.  ***incisions. Extremities: ***Grossly normal range of motion.  Warm, well perfused.  ***No edema bilaterally. Skin: ***No rashes or lesions. Lymphatics: ***No cervical, supraclavicular, or inguinal adenopathy. GU: ***External genitalia without lesions.  On speculum exam, ***.  Bimanual exam reveals ***. ***Rectovaginal exam confirms the above findings and reveals ***no nodularity.  LABORATORY AND RADIOLOGIC DATA: ***Outside medical records were reviewed to synthesize the above history, along with the history and physical obtained during the visit.  Outside ***laboratory, pathology, and imaging reports were reviewed, with pertinent results below.  ***I personally reviewed the outside images.  ***  ASSESSMENT AND PLAN: Arryana Tolleson Raven is a 65 y.o. woman with ***.  A copy of this note was sent to the patient's referring provider. I appreciate the opportunity to be involved in the care of this patient.  Marcello Fennel. Carlis Abbott, MD

## 2016-02-27 ENCOUNTER — Ambulatory Visit: Payer: Medicare Other | Attending: Gynecologic Oncology | Admitting: Gynecologic Oncology

## 2016-02-28 ENCOUNTER — Ambulatory Visit
Admission: RE | Admit: 2016-02-28 | Discharge: 2016-02-28 | Disposition: A | Discharge status: Home / Self Care | Payer: Medicare Other | Source: Ambulatory Visit | Attending: Gynecology | Admitting: Gynecology

## 2016-02-28 DIAGNOSIS — Z9011 Acquired absence of right breast and nipple: Secondary | ICD-10-CM

## 2016-02-28 DIAGNOSIS — Z1231 Encounter for screening mammogram for malignant neoplasm of breast: Secondary | ICD-10-CM | POA: Diagnosis not present

## 2016-03-03 ENCOUNTER — Other Ambulatory Visit: Payer: Self-pay | Admitting: Gynecology

## 2016-03-03 DIAGNOSIS — C50911 Malignant neoplasm of unspecified site of right female breast: Secondary | ICD-10-CM | POA: Diagnosis not present

## 2016-03-03 DIAGNOSIS — M859 Disorder of bone density and structure, unspecified: Secondary | ICD-10-CM | POA: Diagnosis not present

## 2016-03-03 DIAGNOSIS — I890 Lymphedema, not elsewhere classified: Secondary | ICD-10-CM | POA: Diagnosis not present

## 2016-03-03 DIAGNOSIS — R928 Other abnormal and inconclusive findings on diagnostic imaging of breast: Secondary | ICD-10-CM

## 2016-03-03 DIAGNOSIS — G43909 Migraine, unspecified, not intractable, without status migrainosus: Secondary | ICD-10-CM | POA: Diagnosis not present

## 2016-03-03 DIAGNOSIS — M1990 Unspecified osteoarthritis, unspecified site: Secondary | ICD-10-CM | POA: Diagnosis not present

## 2016-03-03 DIAGNOSIS — Z6827 Body mass index (BMI) 27.0-27.9, adult: Secondary | ICD-10-CM | POA: Diagnosis not present

## 2016-03-03 DIAGNOSIS — E784 Other hyperlipidemia: Secondary | ICD-10-CM | POA: Diagnosis not present

## 2016-03-04 ENCOUNTER — Other Ambulatory Visit: Payer: Self-pay | Admitting: Gynecology

## 2016-03-04 ENCOUNTER — Other Ambulatory Visit: Payer: Self-pay

## 2016-03-04 DIAGNOSIS — R928 Other abnormal and inconclusive findings on diagnostic imaging of breast: Secondary | ICD-10-CM

## 2016-03-05 ENCOUNTER — Other Ambulatory Visit: Payer: Self-pay | Admitting: Gynecology

## 2016-03-05 ENCOUNTER — Ambulatory Visit
Admission: RE | Admit: 2016-03-05 | Discharge: 2016-03-05 | Disposition: A | Discharge status: Home / Self Care | Payer: Medicare Other | Source: Ambulatory Visit | Attending: Gynecology | Admitting: Gynecology

## 2016-03-05 DIAGNOSIS — N6320 Unspecified lump in the left breast, unspecified quadrant: Secondary | ICD-10-CM

## 2016-03-05 DIAGNOSIS — N6323 Unspecified lump in the left breast, lower outer quadrant: Secondary | ICD-10-CM | POA: Diagnosis not present

## 2016-03-05 DIAGNOSIS — R928 Other abnormal and inconclusive findings on diagnostic imaging of breast: Secondary | ICD-10-CM

## 2016-03-05 MED FILL — GAVILYTE-N SOLUTION: 420 | 1 days supply | Qty: 4000 | Fill #0

## 2016-03-10 ENCOUNTER — Other Ambulatory Visit: Payer: Self-pay | Admitting: Gynecology

## 2016-03-10 ENCOUNTER — Ambulatory Visit
Admission: RE | Admit: 2016-03-10 | Discharge: 2016-03-10 | Disposition: A | Discharge status: Home / Self Care | Payer: Medicare Other | Source: Ambulatory Visit | Attending: Gynecology | Admitting: Gynecology

## 2016-03-10 DIAGNOSIS — R928 Other abnormal and inconclusive findings on diagnostic imaging of breast: Secondary | ICD-10-CM

## 2016-03-10 DIAGNOSIS — N6320 Unspecified lump in the left breast, unspecified quadrant: Secondary | ICD-10-CM

## 2016-03-10 DIAGNOSIS — C50512 Malignant neoplasm of lower-outer quadrant of left female breast: Secondary | ICD-10-CM | POA: Diagnosis not present

## 2016-03-10 DIAGNOSIS — Z170 Estrogen receptor positive status [ER+]: Secondary | ICD-10-CM | POA: Diagnosis not present

## 2016-03-10 DIAGNOSIS — C50312 Malignant neoplasm of lower-inner quadrant of left female breast: Secondary | ICD-10-CM | POA: Diagnosis not present

## 2016-03-12 DIAGNOSIS — K5730 Diverticulosis of large intestine without perforation or abscess without bleeding: Secondary | ICD-10-CM | POA: Diagnosis not present

## 2016-03-12 DIAGNOSIS — Z1211 Encounter for screening for malignant neoplasm of colon: Secondary | ICD-10-CM | POA: Diagnosis not present

## 2016-03-12 DIAGNOSIS — D126 Benign neoplasm of colon, unspecified: Secondary | ICD-10-CM | POA: Diagnosis not present

## 2016-03-12 DIAGNOSIS — D120 Benign neoplasm of cecum: Secondary | ICD-10-CM | POA: Diagnosis not present

## 2016-03-16 ENCOUNTER — Encounter: Payer: Self-pay | Admitting: General Surgery

## 2016-03-17 ENCOUNTER — Ambulatory Visit: Payer: Medicare Other | Attending: Gynecologic Oncology | Admitting: Gynecologic Oncology

## 2016-03-17 ENCOUNTER — Encounter: Payer: Self-pay | Admitting: Gynecologic Oncology

## 2016-03-17 ENCOUNTER — Ambulatory Visit (HOSPITAL_BASED_OUTPATIENT_CLINIC_OR_DEPARTMENT_OTHER): Payer: Medicare Other

## 2016-03-17 VITALS — BP 143/74 | HR 70 | Temp 99.0°F | Resp 18 | Ht 60.0 in | Wt 144.9 lb

## 2016-03-17 DIAGNOSIS — Z803 Family history of malignant neoplasm of breast: Secondary | ICD-10-CM | POA: Diagnosis not present

## 2016-03-17 DIAGNOSIS — Z853 Personal history of malignant neoplasm of breast: Secondary | ICD-10-CM

## 2016-03-17 DIAGNOSIS — M8580 Other specified disorders of bone density and structure, unspecified site: Secondary | ICD-10-CM | POA: Diagnosis not present

## 2016-03-17 DIAGNOSIS — Z87442 Personal history of urinary calculi: Secondary | ICD-10-CM | POA: Diagnosis not present

## 2016-03-17 DIAGNOSIS — E785 Hyperlipidemia, unspecified: Secondary | ICD-10-CM | POA: Diagnosis not present

## 2016-03-17 DIAGNOSIS — Z1501 Genetic susceptibility to malignant neoplasm of breast: Secondary | ICD-10-CM

## 2016-03-17 DIAGNOSIS — Z1502 Genetic susceptibility to malignant neoplasm of ovary: Secondary | ICD-10-CM | POA: Insufficient documentation

## 2016-03-17 DIAGNOSIS — C50912 Malignant neoplasm of unspecified site of left female breast: Secondary | ICD-10-CM

## 2016-03-17 DIAGNOSIS — R1909 Other intra-abdominal and pelvic swelling, mass and lump: Secondary | ICD-10-CM | POA: Diagnosis not present

## 2016-03-17 DIAGNOSIS — Z1509 Genetic susceptibility to other malignant neoplasm: Secondary | ICD-10-CM

## 2016-03-17 MED FILL — PROPRANOLOL ER 80 MG CAP: 80 | 90 days supply | Qty: 90 | Fill #2

## 2016-03-17 MED FILL — ZETIA 10 MG TABLET: 10 | 30 days supply | Qty: 30 | Fill #5

## 2016-03-17 NOTE — Patient Instructions (Signed)
Blood Work today: CA 125, appt at 1:30pm Pelvic Ultrasound: 03/25/16 appt @ 2pm arrive at 1:45pm NEED TO HAVE A FULL BLADDER FOR ULTRASOUND  Follow up with Dr Phineas Real for breast surgery

## 2016-03-17 NOTE — Progress Notes (Signed)
Consult Note: Gyn-Onc  Consult was requested by Dr. Phineas Real for the evaluation of Cheryl [name] 65 y.o. female  CC:  Chief Complaint  Patient presents with  . possible surgery breast/ovarian genes    Assessment/Plan:  [name]  is a 65 y.o.  year old with a history of an indeterminant BRCA 2 mutation and bilateral breast cancer (recently diagnosed left sided lesion).  I discussed that her particular mutation is associated with unclear risk for malignancy of the ovary, however, given the potential lethality of ovarian cancer, its difficulty in early diagnosis with inadequate screening tests and the fact that she has an increased risk (albeit of unclear percentage) for ovarian cancer I recommend proceeding with bilateral salpingo-oophorectomy. The patient feels strongly about the addition of hysterectomy to the procedure. I discussed that there is not a well defined link between BRCA 2 mutations and uterine cancer. I discussed that the addition of hysterectomy can add some risk and recovery to the procedure however it is not unreasonable to consider this as part of her procedure.  She will see Dr Dalbert Batman to discuss plans for left breast surgery.  We will obtain US and CA 125 to rule out occult ovarian lesions. If this is normal, it is reasonable to delay her hysterectomy/BSO until after her breast surgery in order to prevent delays in coordinating schedules with Dr Phineas Real and myself. However, if Dr Dalbert Batman is able to easily facilitate a joint procedure, the patient would be interested in this.   HPI: Cheryl [name] is a very pleasant 65 year old G0 who is seen in consultation at the request of Dr Phineas Real for an indeterminent BRCA2 mutation in the setting of a new left sided breast cancer diagnosis.  The patient was tested in 2009 and the pannet revealed a mutation of [id number] which was considered of uncertain significance.  The patient has a personal history of right breast  cancer, a mother with bilateral breast cancer and multiple great aunts with breast cancer. There is no ovarian cancer family history.  The patient is nulliparous. She is generally very healthy. Her only abdominal surgery was a TRAM flap reconstruction in 1999 at [facility] (mesh was used).   Her last abdominal US was normal in 2009.  She is a patient of Dr Gearldine Shown for breast cancer management.   Current Meds:  Outpatient Encounter Prescriptions as of 03/17/2016  Medication Sig  . cephALEXin (KEFLEX) 500 MG capsule Take 500 mg by mouth as needed.  . ergocalciferol (VITAMIN D2) 50000 UNITS capsule Take 50,000 Units by mouth daily. Liquid  . esomeprazole (NEXIUM) 20 MG capsule Take 20 mg by mouth daily at 12 noon.  . ezetimibe (ZETIA) 10 MG tablet Take 1 tablet (10 mg total) by mouth daily.  . propranolol (INDERAL LA) 80 MG 24 hr capsule Take 80 mg by mouth daily.  . rizatriptan (MAXALT) 10 MG tablet Take 10 mg by mouth as needed. May repeat in 2 hours if needed  . rosuvastatin (CRESTOR) 40 MG tablet TAKE 1 TABLET BY MOUTH ONCE DAILY  . aspirin EC 81 MG tablet Take 1 tablet (81 mg total) by mouth daily.   No facility-administered encounter medications on file as of 03/17/2016.     Allergy:  Allergies  Allergen Reactions  . Demerol Hives  . Theophyllines Palpitations    Social Hx:   Social History   Social History  . Marital status: Single    Spouse name: N/A  . Number of children:  N/A  . Years of education: N/A   Occupational History  . Nurse [employer]   Social History Main Topics  . Smoking status: Never Smoker  . Smokeless tobacco: Never Used  . Alcohol use 0.0 oz/week     Comment: VERY RARE  . Drug use: No  . Sexual activity: No     Comment:  Virgin   Other Topics Concern  . Not on file   Social History Narrative  . No narrative on file    Past Surgical Hx:  Past Surgical History:  Procedure Laterality Date  . BREAST SURGERY     Mastectomy with TRAM  .  CYSTOGRAM    . FOOT SURGERY  [id number]  . left thumb  1/06  . right mastectomy  10-27-97   with tram flap  . right thumb  1/05  . TONSILLECTOMY AND ADENOIDECTOMY      Past Medical Hx:  Past Medical History:  Diagnosis Date  . Arthritis   . Breast cancer ([place]) 1999   right breast, Adriamycin Therapy  . Cellulitis   . Chronic acquired lymphedema    rt arm, s/p right breast cancer  . History of kidney stones   . History of migraine   . Hyperlipidemia   . Kidney problem 1969   history of left kidney bleeding  . Osteopenia 02/2015   T score -2.0. Prior DEXA 2010 T score -1.7. FRAX 20%/2.1%. Based upon prior history of Fosamax treatment will hold on treatment now with repeat at 2 year interval for stability  . Strep throat    age 61  . Ventricular ectopy    chronic    Past Gynecological History:  G0 No LMP recorded. Patient is postmenopausal.  Family Hx:  Family History  Problem Relation Age of Onset  . Breast cancer Mother 78    dx 3 times, bilateral  . Heart disease Father     CABG  . Hypertension Father   . Dementia Father   . Hyperlipidemia Sister   . Tuberculosis Paternal Grandmother   . Heart attack Paternal Grandfather   . Breast cancer Other     MGM's sister  . Breast cancer Other     MGMs sister  . Breast cancer Other     MGMs sister  . Prostate cancer Other     MGMs father    Review of Systems:  Constitutional  Feels well,    ENT Normal appearing ears and nares bilaterally Skin/Breast  No rash, sores, jaundice, itching, dryness Cardiovascular  No chest pain, shortness of breath, or edema  Pulmonary  No cough or wheeze.  Gastro Intestinal  No nausea, vomitting, or diarrhoea. No bright red blood per rectum, no abdominal pain, change in bowel movement, or constipation.  Genito Urinary  No frequency, urgency, dysuria, no bleeding Musculo Skeletal  No myalgia, arthralgia, joint swelling or pain  Neurologic  No weakness, numbness, change in gait,   Psychology  No depression, anxiety, insomnia.   Vitals:  Blood pressure (!) 143/74, pulse 70, temperature 99 F (37.2 C), temperature source Oral, resp. rate 18, height 5' (1.524 m), weight 144 lb 14.4 oz (65.7 kg), SpO2 99 %.  Physical Exam: WD in NAD Neck  Supple NROM, without any enlargements.  Lymph Node Survey No cervical supraclavicular or inguinal adenopathy Cardiovascular  Pulse normal rate, regularity and rhythm. S1 and S2 normal.  Lungs  Clear to auscultation bilateraly, without wheezes/crackles/rhonchi. Good air movement.  Skin  No rash/lesions/breakdown  Psychiatry  Alert and oriented to person, place, and time  Abdomen  Normoactive bowel sounds, abdomen soft, non-tender and obese without evidence of hernia.  Back No CVA tenderness Genito Urinary  Vulva/vagina: Normal external female genitalia.  No lesions. No discharge or bleeding.  Bladder/urethra:  No lesions or masses, well supported bladder  Vagina: normal  Cervix: Normal appearing, no lesions.  Uterus: Small, mobile, no parametrial involvement or nodularity.  Adnexa: no palpable masses. Rectal  deferred Extremities  No bilateral cyanosis, clubbing or edema.   [name], MD  03/17/2016, 1:10 PM

## 2016-03-18 DIAGNOSIS — D126 Benign neoplasm of colon, unspecified: Secondary | ICD-10-CM | POA: Diagnosis not present

## 2016-03-18 DIAGNOSIS — Z1211 Encounter for screening for malignant neoplasm of colon: Secondary | ICD-10-CM | POA: Diagnosis not present

## 2016-03-18 LAB — CA 125: CANCER ANTIGEN (CA) 125: 24.5 U/mL (ref 0.0–38.1)

## 2016-03-20 ENCOUNTER — Other Ambulatory Visit: Payer: Self-pay | Admitting: General Surgery

## 2016-03-20 DIAGNOSIS — Z803 Family history of malignant neoplasm of breast: Secondary | ICD-10-CM | POA: Diagnosis not present

## 2016-03-20 DIAGNOSIS — Z9011 Acquired absence of right breast and nipple: Secondary | ICD-10-CM | POA: Diagnosis not present

## 2016-03-20 DIAGNOSIS — C50512 Malignant neoplasm of lower-outer quadrant of left female breast: Secondary | ICD-10-CM | POA: Diagnosis not present

## 2016-03-20 DIAGNOSIS — Z853 Personal history of malignant neoplasm of breast: Secondary | ICD-10-CM | POA: Diagnosis not present

## 2016-03-20 DIAGNOSIS — Z87442 Personal history of urinary calculi: Secondary | ICD-10-CM | POA: Diagnosis not present

## 2016-03-20 DIAGNOSIS — I972 Postmastectomy lymphedema syndrome: Secondary | ICD-10-CM | POA: Diagnosis not present

## 2016-03-21 ENCOUNTER — Telehealth: Payer: Self-pay | Admitting: Oncology

## 2016-03-21 NOTE — Telephone Encounter (Signed)
Phone just keep ringing, when calling pt regarding 03/27/16 appt with Dr. Benay Spice

## 2016-03-25 ENCOUNTER — Ambulatory Visit (HOSPITAL_COMMUNITY)
Admission: RE | Admit: 2016-03-25 | Discharge: 2016-03-25 | Disposition: A | Discharge status: Home / Self Care | Payer: Medicare Other | Source: Ambulatory Visit | Attending: Gynecologic Oncology | Admitting: Gynecologic Oncology

## 2016-03-25 DIAGNOSIS — Z1501 Genetic susceptibility to malignant neoplasm of breast: Secondary | ICD-10-CM | POA: Insufficient documentation

## 2016-03-25 DIAGNOSIS — Z1502 Genetic susceptibility to malignant neoplasm of ovary: Secondary | ICD-10-CM | POA: Insufficient documentation

## 2016-03-25 DIAGNOSIS — Z139 Encounter for screening, unspecified: Secondary | ICD-10-CM | POA: Diagnosis not present

## 2016-03-25 DIAGNOSIS — Z136 Encounter for screening for cardiovascular disorders: Secondary | ICD-10-CM | POA: Diagnosis not present

## 2016-03-25 DIAGNOSIS — Z1509 Genetic susceptibility to other malignant neoplasm: Secondary | ICD-10-CM

## 2016-03-25 DIAGNOSIS — Z1389 Encounter for screening for other disorder: Secondary | ICD-10-CM | POA: Diagnosis not present

## 2016-03-26 ENCOUNTER — Telehealth: Payer: Self-pay | Admitting: Medical Oncology

## 2016-03-26 ENCOUNTER — Telehealth: Payer: Self-pay | Admitting: Oncology

## 2016-03-26 NOTE — Telephone Encounter (Signed)
Spoke with mother and lt mess for them to arrive for both appts starting at 8:30.

## 2016-03-26 NOTE — Telephone Encounter (Signed)
Pt confirmed appts. 

## 2016-03-27 ENCOUNTER — Ambulatory Visit (HOSPITAL_BASED_OUTPATIENT_CLINIC_OR_DEPARTMENT_OTHER): Payer: Medicare Other | Admitting: Oncology

## 2016-03-27 ENCOUNTER — Telehealth: Payer: Self-pay | Admitting: Oncology

## 2016-03-27 VITALS — BP 144/73 | HR 81 | Temp 99.0°F | Resp 18 | Ht 60.0 in | Wt 143.5 lb

## 2016-03-27 DIAGNOSIS — Z170 Estrogen receptor positive status [ER+]: Secondary | ICD-10-CM

## 2016-03-27 DIAGNOSIS — C50512 Malignant neoplasm of lower-outer quadrant of left female breast: Secondary | ICD-10-CM | POA: Diagnosis not present

## 2016-03-27 DIAGNOSIS — M8580 Other specified disorders of bone density and structure, unspecified site: Secondary | ICD-10-CM

## 2016-03-27 DIAGNOSIS — Z853 Personal history of malignant neoplasm of breast: Secondary | ICD-10-CM

## 2016-03-27 NOTE — Telephone Encounter (Signed)
Appointments scheduled per 03/27/16 los. A copy of the AVS report and appointment schedule was given to the patient, per 03/27/16 los.  °

## 2016-03-27 NOTE — Progress Notes (Signed)
[facility] OFFICE PROGRESS NOTE   Diagnosis: Breast cancer  INTERVAL HISTORY:   Cheryl [name] returns prior to a scheduled visit. She feels well. Stable lymphedema in the right arm. No recent infection. She had a mammogram 02/29/2016. A possible asymmetry was noted in the left breast. A follow-up mammogram 03/05/2016 confirmed a 1.5 cm because related mass in the lower outer quadrant of the left breast. An ultrasound confirmed a hypoechoic irregular mass at the 4:30 position of the left breast measuring 1.4 x 1.3 x 1 cm. No pathologic left axillary lymphadenopathy. A core biopsy was obtained on 03/10/2016 and the pathology ([id number]) confirmed invasive mammary carcinoma consistent with a lobular phenotype. The tumor is HER-2 negative, ER positive, and PR positive. The Ki-67 proliferation marker returned at 20%.  She saw Dr. Dalbert Batman and is considering a lumpectomy versus mastectomy. She saw Dr. Denman George and is considering a prophylactic oophorectomy given her history of bilateral breast cancer and the BRCA2 variant.  Objective:  Vital signs in last 24 hours:  Blood pressure (!) 144/73, pulse 81, temperature 99 F (37.2 C), temperature source Oral, resp. rate 18, height 5' (1.524 m), weight 143 lb 8 oz (65.1 kg), SpO2 100 %.    HEENT: Neck without mass Lymphatics: No cervical, supraclavicular, or axillary nodes Resp: Lungs clear bilaterally Cardio: Regular rate and rhythm GI: No hepatomegaly Vascular: No leg edema Breasts: Status post right mastectomy with a TRAM reconstruction. Firm nodular tissue at the medial aspect of the TRAM. Left breast without mass.   Portacath/PICC-without erythema   Imaging:  US Transvaginal Non-ob  Result Date: 03/25/2016 CLINICAL DATA:  Screening EXAM: TRANSABDOMINAL AND TRANSVAGINAL ULTRASOUND OF PELVIS TECHNIQUE: Both transabdominal and transvaginal ultrasound examinations of the pelvis were performed. Transabdominal technique was  performed for global imaging of the pelvis including uterus, ovaries, adnexal regions, and pelvic cul-de-sac. It was necessary to proceed with endovaginal exam following the transabdominal exam to visualize the uterus, endometrium, ovaries and adnexa . COMPARISON:  CT 01/23/2010 FINDINGS: Uterus Measurements: 4.2 x 2.3 x 3.4 cm. No fibroids or other mass visualized. Endometrium Thickness: 4 mm in thickness.  No focal abnormality visualized. Right ovary Measurements: Not visualized. No adnexal masses seen. Left ovary Measurements: Not visualized. No adnexal masses seen. Other findings No abnormal free fluid. IMPRESSION: Unremarkable study. Electronically Signed   By: Rolm Baptise M.D.   On: 03/25/2016 14:54   US Pelvis Complete  Result Date: 03/25/2016 CLINICAL DATA:  Screening EXAM: TRANSABDOMINAL AND TRANSVAGINAL ULTRASOUND OF PELVIS TECHNIQUE: Both transabdominal and transvaginal ultrasound examinations of the pelvis were performed. Transabdominal technique was performed for global imaging of the pelvis including uterus, ovaries, adnexal regions, and pelvic cul-de-sac. It was necessary to proceed with endovaginal exam following the transabdominal exam to visualize the uterus, endometrium, ovaries and adnexa . COMPARISON:  CT 01/23/2010 FINDINGS: Uterus Measurements: 4.2 x 2.3 x 3.4 cm. No fibroids or other mass visualized. Endometrium Thickness: 4 mm in thickness.  No focal abnormality visualized. Right ovary Measurements: Not visualized. No adnexal masses seen. Left ovary Measurements: Not visualized. No adnexal masses seen. Other findings No abnormal free fluid. IMPRESSION: Unremarkable study. Electronically Signed   By: Rolm Baptise M.D.   On: 03/25/2016 14:54    Medications: I have reviewed the patient's current medications.  Assessment/Plan: 1.Stage III right-sided breast cancer diagnosed in 1999. She remains in clinical remission. She completed adjuvant Femara therapy at the end of August 2009.   2. Left renal lesion on CT scan in  April 2008 - a renal ultrasound confirmed bilateral renal cysts.  3. Osteopenia - she continues vitamin D.  4. 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  5. BRCA2 variant of unclear clinical significance, with a significant family history of breast cancer. Negative breast/ovarian gene panel 06/15/2015 6. History of hematuria - reports being diagnosed with a renal stone by Dr Diona Fanti.  7. Left breast cancer-invasive lobular carcinoma, HER-2 negative, ER positive, PR positive, Ki-09/26/2018 percent confirmed on a core biopsy of a left lower outer quadrant mass on 03/10/2016    Disposition:  Cheryl [name] has been diagnosed with a new contralateral breast cancer. The tumor is hormone receptor positive. There is no clinical evidence of distant metastatic disease. I discussed treatment options with Cheryl [name]. We discussed surgical options including a mastectomy/sentinel lymph node biopsy and lumpectomy/sentinel lymph node biopsy. We discussed the expected equivalent long-term outcome following these procedures. We discussed the potential need for adjuvant hormonal therapy and radiation.  She would like to proceed with a lumpectomy/sentinel lymph node procedure. I will see her after surgery to discuss the role for radiation and systemic therapy.  She is scheduled for a breast MRI next week.  Approximately 30 minutes were spent with the patient today.  Betsy Coder, MD  03/27/2016  10:21 AM

## 2016-03-31 ENCOUNTER — Telehealth: Payer: Self-pay | Admitting: Gynecologic Oncology

## 2016-03-31 ENCOUNTER — Ambulatory Visit
Admission: RE | Admit: 2016-03-31 | Discharge: 2016-03-31 | Disposition: A | Discharge status: Home / Self Care | Payer: Medicare Other | Source: Ambulatory Visit | Attending: General Surgery | Admitting: General Surgery

## 2016-03-31 DIAGNOSIS — N6489 Other specified disorders of breast: Secondary | ICD-10-CM | POA: Diagnosis not present

## 2016-03-31 DIAGNOSIS — C50512 Malignant neoplasm of lower-outer quadrant of left female breast: Secondary | ICD-10-CM

## 2016-03-31 MED ORDER — GADOBENATE DIMEGLUMINE 529 MG/ML IV SOLN
13.0000 mL | Freq: Once | INTRAVENOUS | Status: AC | PRN
  Administered 2016-03-31: 13 mL via INTRAVENOUS

## 2016-03-31 NOTE — Telephone Encounter (Signed)
Informed patient of results of normal [place] and CA 125. It is safe and reasonable to wait until after her breast surgery is complete to schedule robotic hyst/bso, particularly if it is not straightforward to coordinate surgeons' schedules. Warm regards, Everitt Amber

## 2016-04-01 MED FILL — ROSUVASTATIN CALCIUM 40 MG: 40 | 30 days supply | Qty: 30 | Fill #6

## 2016-04-03 ENCOUNTER — Other Ambulatory Visit: Payer: Self-pay | Admitting: General Surgery

## 2016-04-03 DIAGNOSIS — C50512 Malignant neoplasm of lower-outer quadrant of left female breast: Secondary | ICD-10-CM | POA: Diagnosis not present

## 2016-04-03 DIAGNOSIS — Z87442 Personal history of urinary calculi: Secondary | ICD-10-CM | POA: Diagnosis not present

## 2016-04-03 DIAGNOSIS — Z803 Family history of malignant neoplasm of breast: Secondary | ICD-10-CM | POA: Diagnosis not present

## 2016-04-03 DIAGNOSIS — I972 Postmastectomy lymphedema syndrome: Secondary | ICD-10-CM | POA: Diagnosis not present

## 2016-04-03 DIAGNOSIS — Z170 Estrogen receptor positive status [ER+]: Principal | ICD-10-CM

## 2016-04-03 DIAGNOSIS — Z9011 Acquired absence of right breast and nipple: Secondary | ICD-10-CM | POA: Diagnosis not present

## 2016-04-03 DIAGNOSIS — Z853 Personal history of malignant neoplasm of breast: Secondary | ICD-10-CM | POA: Diagnosis not present

## 2016-04-11 ENCOUNTER — Other Ambulatory Visit: Payer: Self-pay | Admitting: General Surgery

## 2016-04-11 DIAGNOSIS — C50512 Malignant neoplasm of lower-outer quadrant of left female breast: Secondary | ICD-10-CM

## 2016-04-11 DIAGNOSIS — Z170 Estrogen receptor positive status [ER+]: Principal | ICD-10-CM

## 2016-04-16 ENCOUNTER — Telehealth: Payer: Self-pay | Admitting: Oncology

## 2016-04-16 NOTE — Telephone Encounter (Signed)
1/16 Appointment rescheduled to 1/25 per patient request. Per the patient, the 1/16 appointment was meant to be a follow up appointment following her surgery on 1/17. Patient requested to have follow up appointment scheduled for the following week.

## 2016-04-21 HISTORY — PX: BREAST LUMPECTOMY: SHX2

## 2016-04-29 ENCOUNTER — Encounter (HOSPITAL_COMMUNITY): Payer: Self-pay

## 2016-04-29 ENCOUNTER — Encounter (HOSPITAL_COMMUNITY)
Admission: RE | Admit: 2016-04-29 | Discharge: 2016-04-29 | Disposition: A | Discharge status: Home / Self Care | Payer: Medicare Other | Source: Ambulatory Visit | Attending: General Surgery | Admitting: General Surgery

## 2016-04-29 DIAGNOSIS — Z01812 Encounter for preprocedural laboratory examination: Secondary | ICD-10-CM | POA: Insufficient documentation

## 2016-04-29 DIAGNOSIS — N289 Disorder of kidney and ureter, unspecified: Secondary | ICD-10-CM | POA: Insufficient documentation

## 2016-04-29 DIAGNOSIS — C50919 Malignant neoplasm of unspecified site of unspecified female breast: Secondary | ICD-10-CM | POA: Diagnosis not present

## 2016-04-29 DIAGNOSIS — Z853 Personal history of malignant neoplasm of breast: Secondary | ICD-10-CM | POA: Diagnosis not present

## 2016-04-29 DIAGNOSIS — R590 Localized enlarged lymph nodes: Secondary | ICD-10-CM | POA: Diagnosis not present

## 2016-04-29 DIAGNOSIS — K219 Gastro-esophageal reflux disease without esophagitis: Secondary | ICD-10-CM | POA: Diagnosis not present

## 2016-04-29 DIAGNOSIS — Z01810 Encounter for preprocedural cardiovascular examination: Secondary | ICD-10-CM | POA: Insufficient documentation

## 2016-04-29 DIAGNOSIS — Z1502 Genetic susceptibility to malignant neoplasm of ovary: Secondary | ICD-10-CM | POA: Diagnosis not present

## 2016-04-29 DIAGNOSIS — I493 Ventricular premature depolarization: Secondary | ICD-10-CM | POA: Diagnosis not present

## 2016-04-29 DIAGNOSIS — Z803 Family history of malignant neoplasm of breast: Secondary | ICD-10-CM | POA: Diagnosis not present

## 2016-04-29 DIAGNOSIS — Q248 Other specified congenital malformations of heart: Secondary | ICD-10-CM | POA: Diagnosis not present

## 2016-04-29 DIAGNOSIS — E785 Hyperlipidemia, unspecified: Secondary | ICD-10-CM | POA: Insufficient documentation

## 2016-04-29 HISTORY — DX: Personal history of other diseases of the digestive system: Z87.19

## 2016-04-29 HISTORY — DX: Gastro-esophageal reflux disease without esophagitis: K21.9

## 2016-04-29 HISTORY — DX: Inflammatory liver disease, unspecified: K75.9

## 2016-04-29 HISTORY — DX: Headache, unspecified: R51.9

## 2016-04-29 HISTORY — DX: Headache: R51

## 2016-04-29 LAB — COMPREHENSIVE METABOLIC PANEL
ALBUMIN: 4.4 g/dL (ref 3.5–5.0)
ALT: 28 U/L (ref 14–54)
ANION GAP: 10 (ref 5–15)
AST: 34 U/L (ref 15–41)
Alkaline Phosphatase: 77 U/L (ref 38–126)
BILIRUBIN TOTAL: 0.9 mg/dL (ref 0.3–1.2)
BUN: 13 mg/dL (ref 6–20)
CHLORIDE: 108 mmol/L (ref 101–111)
CO2: 23 mmol/L (ref 22–32)
Calcium: 10.1 mg/dL (ref 8.9–10.3)
Creatinine, Ser: 0.69 mg/dL (ref 0.44–1.00)
GFR calc Af Amer: >60 mL/min (ref >60)
Glucose, Bld: 118 mg/dL — ABNORMAL HIGH (ref 65–99)
POTASSIUM: 4.1 mmol/L (ref 3.5–5.1)
Sodium: 141 mmol/L (ref 135–145)
TOTAL PROTEIN: 7.8 g/dL (ref 6.5–8.1)

## 2016-04-29 LAB — CBC WITH DIFFERENTIAL/PLATELET
Basophils Absolute: 0 10*3/uL (ref 0.0–0.1)
Basophils Relative: 1 %
EOS PCT: 1 %
Eosinophils Absolute: 0.1 10*3/uL (ref 0.0–0.7)
HEMATOCRIT: 41.1 % (ref 36.0–46.0)
Hemoglobin: 14.5 g/dL (ref 12.0–15.0)
Lymphocytes Relative: 29 %
Lymphs Abs: 1.9 10*3/uL (ref 0.7–4.0)
MCH: 32 pg (ref 26.0–34.0)
MCHC: 35.3 g/dL (ref 30.0–36.0)
MCV: 90.7 fL (ref 78.0–100.0)
MONO ABS: 0.3 10*3/uL (ref 0.1–1.0)
MONOS PCT: 5 %
Neutro Abs: 4.1 10*3/uL (ref 1.7–7.7)
Neutrophils Relative %: 64 %
PLATELETS: 187 10*3/uL (ref 150–400)
RBC: 4.53 MIL/uL (ref 3.87–5.11)
RDW: 12.3 % (ref 11.5–15.5)
WBC: 6.4 10*3/uL (ref 4.0–10.5)

## 2016-04-29 NOTE — Progress Notes (Signed)
PCP:Dr. Joya Salm Cardiologist: Dr. Aundra Dubin Oncologist: Dr. Benay Spice Urologist: Dr. Vernie Shanks  Pt. States she is on inderal for migranes. Denies any cardiac problems. Stress tess/echo done due to being on chemotherapy.

## 2016-04-29 NOTE — Pre-Procedure Instructions (Addendum)
    Cheryl [name]  04/29/2016      [place], [place] - [address] [facility]. [address] [place] [address] Phone: [phone] Fax: [phone]    Your procedure is scheduled on Wednesday, January 17.  Report to [facility] Admitting at 8:00 AM               Your surgery or procedure is scheduled for 10:00AM   Call this number if you have problems the morning of surgery:[phone]                For any other questions, please call [phone], Monday - Friday 8 AM - 4 PM.    Remember:  Do not eat food or drink liquids after midnight Tuesday, January 16.                 With exception of the Box of 'Breeze' you were given, please drink the entire box at 6:00 am   Take these medicines the morning of surgery with A SIP OF WATER: esomeprazole (NEXIUM), propranolol (INDERAL LA).                   Take if needed rizatriptan (MAXALT), fexofenadine (ALLEGRA).  Stop aspirin, advil, motrin, aleve, ibuprofen, vitamins/herbal medicines.   Do not wear jewelry, make-up or nail polish.  Do not wear lotions, powders, or perfumes, or deodorant.  Do not shave 48 hours prior to surgery.  Do not bring valuables to the hospital.  [facility] is not responsible for any belongings or valuables.  Contacts, dentures or bridgework may not be worn into surgery.  Leave your suitcase in the car.  After surgery it may be brought to your room.  For patients admitted to the hospital, discharge time will be determined by your treatment team.  Patients discharged the day of surgery will not be allowed to drive home.   Name and phone number of your driver:  -  Please read over the following fact sheets that you were given: [place]- Preparing For Surgery, Coughing and Deep Breathing,Pain Booklet

## 2016-05-05 ENCOUNTER — Ambulatory Visit
Admission: RE | Admit: 2016-05-05 | Discharge: 2016-05-05 | Disposition: A | Discharge status: Home / Self Care | Payer: Medicare Other | Source: Ambulatory Visit | Attending: General Surgery | Admitting: General Surgery

## 2016-05-05 DIAGNOSIS — Z170 Estrogen receptor positive status [ER+]: Principal | ICD-10-CM

## 2016-05-05 DIAGNOSIS — C50512 Malignant neoplasm of lower-outer quadrant of left female breast: Secondary | ICD-10-CM

## 2016-05-05 DIAGNOSIS — C50912 Malignant neoplasm of unspecified site of left female breast: Secondary | ICD-10-CM | POA: Diagnosis not present

## 2016-05-05 NOTE — H&P (Signed)
[place] Location: [facility] Patient #: [id number] DOB: [birth year] Single / Language: [name] / Race: White Female        History of Present Illness      This is a very pleasant 66 year old female who returns with her mother to have a final discussion and to schedule definitive surgery for her left breast cancer. Dr. Reynaldo Minium is her PCP. Dr. Benay Spice has been her oncologist for many years. She has seen Dr. Everitt Amber about elective hysterectomy and nephrectomy which is planned electively in the future.       Past history is significant for right modified radical mastectomy by Dr. Margot Chimes with TRAM flap reconstruction by Dr. Stephanie Coup, 1996. Positive nodes and a stage III cancer. Received radiation therapy and tamoxifen for 5 years and Femara for 5 years and has had no local recurrence. Chronic scar tissue medially on the right side.       Recent screening mammogram showed a 1.4 cm mass in the left breast 4:30 position 8 cm from the nipple. Axilla normal. Image guided biopsy shows grade 1 invasive lobular carcinoma, receptor positive, HER-2 negative. Recent genetic testing is negative      Bilateral breast MRI shows a solitary 1.3 cm mass in the lower outer quadrant of the left breast but no other masses or abnormal areas of enhancement in the reconstructed right breast. No abnormal lymph nodes. She has seen Dr. Benay Spice for a preop visit. She is now motivated for breast conservation surgery, and I think she is a good candidate for that. She knows she will most likely be offered radiation therapy postop.       She will be scheduled for left breast lumpectomy with radioactive seed localization and left axillary sentinel lymph node biopsy. I discussed the indications, details, techniques, and numerous risk of the surgery with her and her mother. She is aware of the risk of bleeding, infection, cosmetic deformity, reoperation for positive margins, reoperations if multiple  positive nodes, arm numbness, arm swelling, shoulder disability. She understands all these issues well. All of her questions were answered. She agrees with this plan. We will try to get the surgery done in early January.   Allergies Demerol *ANALGESICS - OPIOID*  Hives. Theophylline *ANTIASTHMATIC AND BRONCHODILATOR AGENTS*  Palpitations.  Medication History  Keflex (500MG Capsule, Oral as needed) Active. Vitamin D (50000U Capsule, Oral daily) Active. (Solution) NexIUM (20MG Capsule DR, Oral daily) Active. Zetia (10MG Tablet, Oral daily) Active. Inderal LA (80MG Capsule ER 24HR, Oral daily) Active. Crestor (40MG Tablet, Oral daily) Active. Medications Reconciled  Vitals  Weight: 143 lb Height: 60in Body Surface Area: 1.62 m Body Mass Index: 27.93 kg/m  Pulse: 78 (Regular)  BP: 138/70 (Sitting, Left Arm, Standard)    Physical Exam  General Mental Status-Alert. General Appearance-Not in acute distress. Build & Nutrition-Well nourished. Posture-Normal posture. Gait-Normal.  Head and Neck Head-normocephalic, atraumatic with no lesions or palpable masses. Trachea-midline. Thyroid Gland Characteristics - normal size and consistency and no palpable nodules.  Chest and Lung Exam Chest and lung exam reveals -on auscultation, normal breath sounds, no adventitious sounds and normal vocal resonance.  Breast Note: Right breast surgically absent with TRAM flap reconstruction. A couple of areas of hard nodular fibrosis medially which are chronic for her to be scar tissue. No other nodules or ulceration or axillary adenopathy. No evidence of cancer recurrence on the right. On the left side she has minor resolving ecchymoses laterally but no palpable mass or skin changes.  No axillary adenopathy on the left. She does have significant lymphedema of the right upper extremity with sleeve in place.   Cardiovascular Cardiovascular examination reveals  -normal heart sounds, regular rate and rhythm with no murmurs and femoral artery auscultation bilaterally reveals normal pulses, no bruits, no thrills.  Abdomen Inspection Inspection of the abdomen reveals - No Hernias. Palpation/Percussion Palpation and Percussion of the abdomen reveal - Soft, Non Tender, No Rigidity (guarding), No hepatosplenomegaly and No Palpable abdominal masses.  Neurologic Neurologic evaluation reveals -alert and oriented x 3 with no impairment of recent or remote memory, normal attention span and ability to concentrate, normal sensation and normal coordination.  Musculoskeletal Normal Exam - Bilateral-Upper Extremity Strength Normal and Lower Extremity Strength Normal.    Assessment & Plan  PRIMARY CANCER OF LOWER OUTER QUADRANT OF LEFT FEMALE BREAST (C50.512)   Your breast MRI looks good. It demonstrates only a solitary 1.3 cm mass in the lower outer quadrant of the left breast. There is no evidence of cancer elsewhere, including the right breast reconstruction and the regional lymph nodes. This gives [place] the option of lumpectomy and sentinel node biopsy, and we have decided to go ahead with breast conservation surgery. you have seen Dr. Benay Spice and he agrees with this plan You'll be referred back to Dr. Benay Spice and to one of the radiation oncologist postop. Most likely you will be offered radiation therapy  you will be scheduled for left breast lumpectomy with radioactive seed localization and left axillary sentinel lymph node biopsy We have discussed the techniques, indications, and risks of this surgery in detail.  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 Dalbert Batman, M.D., [facility] General and  Minimally invasive Surgery Breast and Colorectal Surgery Office:   [phone] Pager:   [phone]

## 2016-05-06 ENCOUNTER — Ambulatory Visit: Payer: Medicare Other | Admitting: Oncology

## 2016-05-06 ENCOUNTER — Other Ambulatory Visit: Payer: Self-pay | Admitting: *Deleted

## 2016-05-06 MED ORDER — EZETIMIBE 10 MG PO TABS: 10.0000 mg | ORAL_TABLET | Freq: Every day | ORAL | 0 refills | Status: DC

## 2016-05-06 MED FILL — EZETIMIBE 10 MG TABLET: 10 | 30 days supply | Qty: 30 | Fill #0

## 2016-05-07 ENCOUNTER — Encounter (HOSPITAL_COMMUNITY)
Admission: RE | Admit: 2016-05-07 | Discharge: 2016-05-07 | Disposition: A | Discharge status: Home / Self Care | Payer: Medicare Other | Source: Ambulatory Visit | Attending: General Surgery | Admitting: General Surgery

## 2016-05-07 ENCOUNTER — Ambulatory Visit (HOSPITAL_BASED_OUTPATIENT_CLINIC_OR_DEPARTMENT_OTHER)
Admission: RE | Admit: 2016-05-07 | Discharge: 2016-05-07 | Disposition: A | Discharge status: Home / Self Care | Payer: Medicare Other | Source: Ambulatory Visit | Attending: General Surgery | Admitting: General Surgery

## 2016-05-07 ENCOUNTER — Encounter (HOSPITAL_COMMUNITY): Payer: Self-pay | Admitting: *Deleted

## 2016-05-07 ENCOUNTER — Ambulatory Visit (HOSPITAL_COMMUNITY): Payer: Medicare Other | Admitting: Emergency Medicine

## 2016-05-07 ENCOUNTER — Ambulatory Visit (HOSPITAL_COMMUNITY): Payer: Medicare Other | Admitting: Certified Registered"

## 2016-05-07 ENCOUNTER — Ambulatory Visit
Admission: RE | Admit: 2016-05-07 | Discharge: 2016-05-07 | Disposition: A | Discharge status: Home / Self Care | Payer: Medicare Other | Source: Ambulatory Visit | Attending: General Surgery | Admitting: General Surgery

## 2016-05-07 ENCOUNTER — Encounter (HOSPITAL_COMMUNITY)
Admission: RE | Disposition: A | Discharge status: Home / Self Care | Payer: Self-pay | Source: Ambulatory Visit | Attending: General Surgery

## 2016-05-07 DIAGNOSIS — Z905 Acquired absence of kidney: Secondary | ICD-10-CM | POA: Insufficient documentation

## 2016-05-07 DIAGNOSIS — K219 Gastro-esophageal reflux disease without esophagitis: Secondary | ICD-10-CM | POA: Diagnosis not present

## 2016-05-07 DIAGNOSIS — Z923 Personal history of irradiation: Secondary | ICD-10-CM | POA: Insufficient documentation

## 2016-05-07 DIAGNOSIS — C50512 Malignant neoplasm of lower-outer quadrant of left female breast: Secondary | ICD-10-CM | POA: Diagnosis not present

## 2016-05-07 DIAGNOSIS — Z170 Estrogen receptor positive status [ER+]: Principal | ICD-10-CM

## 2016-05-07 DIAGNOSIS — Z79899 Other long term (current) drug therapy: Secondary | ICD-10-CM | POA: Diagnosis not present

## 2016-05-07 DIAGNOSIS — E785 Hyperlipidemia, unspecified: Secondary | ICD-10-CM | POA: Diagnosis not present

## 2016-05-07 DIAGNOSIS — K449 Diaphragmatic hernia without obstruction or gangrene: Secondary | ICD-10-CM | POA: Diagnosis not present

## 2016-05-07 DIAGNOSIS — C50912 Malignant neoplasm of unspecified site of left female breast: Secondary | ICD-10-CM | POA: Diagnosis not present

## 2016-05-07 DIAGNOSIS — Z803 Family history of malignant neoplasm of breast: Secondary | ICD-10-CM | POA: Insufficient documentation

## 2016-05-07 HISTORY — DX: Malignant neoplasm of lower-outer quadrant of left female breast: C50.512

## 2016-05-07 HISTORY — PX: BREAST LUMPECTOMY WITH RADIOACTIVE SEED AND SENTINEL LYMPH NODE BIOPSY: SHX6550

## 2016-05-07 SURGERY — BREAST LUMPECTOMY WITH RADIOACTIVE SEED AND SENTINEL LYMPH NODE BIOPSY
Anesthesia: General | Site: Breast | Laterality: Left

## 2016-05-07 MED ORDER — METHYLENE BLUE 0.5 % INJ SOLN
INTRAVENOUS | Status: AC
  Filled 2016-05-07: qty 10

## 2016-05-07 MED ORDER — DEXAMETHASONE SODIUM PHOSPHATE 10 MG/ML IJ SOLN
INTRAMUSCULAR | Status: DC | PRN
  Administered 2016-05-07: 10 mg via INTRAVENOUS

## 2016-05-07 MED ORDER — MIDAZOLAM HCL 2 MG/2ML IJ SOLN
1.0000 mg | Freq: Once | INTRAMUSCULAR | Status: AC
  Administered 2016-05-07: 1 mg via INTRAVENOUS
  Filled 2016-05-07: qty 2

## 2016-05-07 MED ORDER — SODIUM CHLORIDE 0.9 % IJ SOLN
INTRAVENOUS | Status: DC | PRN
  Administered 2016-05-07: 5 mL

## 2016-05-07 MED ORDER — SUGAMMADEX SODIUM 200 MG/2ML IV SOLN
INTRAVENOUS | Status: DC | PRN
  Administered 2016-05-07: 128 mg via INTRAVENOUS

## 2016-05-07 MED ORDER — LACTATED RINGERS IV SOLN: INTRAVENOUS | Status: DC

## 2016-05-07 MED ORDER — PROPOFOL 10 MG/ML IV BOLUS
INTRAVENOUS | Status: DC | PRN
  Administered 2016-05-07: 170 mg via INTRAVENOUS

## 2016-05-07 MED ORDER — ACETAMINOPHEN 650 MG RE SUPP: 650.0000 mg | RECTAL | Status: DC | PRN

## 2016-05-07 MED ORDER — BUPIVACAINE HCL (PF) 0.25 % IJ SOLN
INTRAMUSCULAR | Status: DC | PRN
  Administered 2016-05-07: 14 mL

## 2016-05-07 MED ORDER — SUGAMMADEX SODIUM 200 MG/2ML IV SOLN
INTRAVENOUS | Status: AC
  Filled 2016-05-07: qty 2

## 2016-05-07 MED ORDER — FENTANYL CITRATE (PF) 100 MCG/2ML IJ SOLN
50.0000 ug | Freq: Once | INTRAMUSCULAR | Status: AC
  Administered 2016-05-07: 50 ug via INTRAVENOUS
  Filled 2016-05-07: qty 2

## 2016-05-07 MED ORDER — PHENYLEPHRINE HCL 10 MG/ML IJ SOLN
INTRAVENOUS | Status: DC | PRN
  Administered 2016-05-07: 25 ug/min via INTRAVENOUS

## 2016-05-07 MED ORDER — FENTANYL CITRATE (PF) 100 MCG/2ML IJ SOLN
INTRAMUSCULAR | Status: AC
  Filled 2016-05-07: qty 2

## 2016-05-07 MED ORDER — FENTANYL CITRATE (PF) 100 MCG/2ML IJ SOLN
INTRAMUSCULAR | Status: DC | PRN
  Administered 2016-05-07 (×2): 50 ug via INTRAVENOUS

## 2016-05-07 MED ORDER — GABAPENTIN 300 MG PO CAPS
300.0000 mg | ORAL_CAPSULE | ORAL | Status: AC
  Administered 2016-05-07: 300 mg via ORAL
  Filled 2016-05-07: qty 1

## 2016-05-07 MED ORDER — SODIUM CHLORIDE 0.9% FLUSH: 3.0000 mL | Freq: Two times a day (BID) | INTRAVENOUS | Status: DC

## 2016-05-07 MED ORDER — MIDAZOLAM HCL 5 MG/5ML IJ SOLN
INTRAMUSCULAR | Status: DC | PRN
  Administered 2016-05-07: 2 mg via INTRAVENOUS

## 2016-05-07 MED ORDER — ROCURONIUM BROMIDE 50 MG/5ML IV SOSY
PREFILLED_SYRINGE | INTRAVENOUS | Status: AC
  Filled 2016-05-07: qty 5

## 2016-05-07 MED ORDER — HYDROCODONE-ACETAMINOPHEN 5-325 MG PO TABS: 1.0000 | ORAL_TABLET | Freq: Four times a day (QID) | ORAL | 0 refills | Status: DC | PRN

## 2016-05-07 MED ORDER — SODIUM CHLORIDE 0.9% FLUSH: 3.0000 mL | INTRAVENOUS | Status: DC | PRN

## 2016-05-07 MED ORDER — OXYCODONE HCL 5 MG PO TABS
ORAL_TABLET | ORAL | Status: AC
  Filled 2016-05-07: qty 2

## 2016-05-07 MED ORDER — 0.9 % SODIUM CHLORIDE (POUR BTL) OPTIME
TOPICAL | Status: DC | PRN
  Administered 2016-05-07: 1000 mL

## 2016-05-07 MED ORDER — ONDANSETRON HCL 4 MG/2ML IJ SOLN
INTRAMUSCULAR | Status: AC
  Filled 2016-05-07: qty 4

## 2016-05-07 MED ORDER — CELECOXIB 200 MG PO CAPS
400.0000 mg | ORAL_CAPSULE | ORAL | Status: AC
  Administered 2016-05-07: 400 mg via ORAL
  Filled 2016-05-07: qty 2

## 2016-05-07 MED ORDER — EPHEDRINE SULFATE 50 MG/ML IJ SOLN
INTRAMUSCULAR | Status: DC | PRN
  Administered 2016-05-07 (×2): 5 mg via INTRAVENOUS

## 2016-05-07 MED ORDER — BUPIVACAINE HCL (PF) 0.25 % IJ SOLN
INTRAMUSCULAR | Status: AC
  Filled 2016-05-07: qty 30

## 2016-05-07 MED ORDER — ROCURONIUM BROMIDE 100 MG/10ML IV SOLN
INTRAVENOUS | Status: DC | PRN
  Administered 2016-05-07: 40 mg via INTRAVENOUS

## 2016-05-07 MED ORDER — ONDANSETRON HCL 4 MG/2ML IJ SOLN
INTRAMUSCULAR | Status: DC | PRN
  Administered 2016-05-07: 4 mg via INTRAVENOUS

## 2016-05-07 MED ORDER — TECHNETIUM TC 99M SULFUR COLLOID FILTERED
1.0000 | Freq: Once | INTRAVENOUS | Status: AC | PRN
  Administered 2016-05-07: 1 via INTRADERMAL

## 2016-05-07 MED ORDER — CHLORHEXIDINE GLUCONATE CLOTH 2 % EX PADS: 6.0000 | MEDICATED_PAD | Freq: Once | CUTANEOUS | Status: DC

## 2016-05-07 MED ORDER — FENTANYL CITRATE (PF) 100 MCG/2ML IJ SOLN
25.0000 ug | INTRAMUSCULAR | Status: DC | PRN
  Administered 2016-05-07: 50 ug via INTRAVENOUS

## 2016-05-07 MED ORDER — SODIUM CHLORIDE 0.9 % IJ SOLN
INTRAMUSCULAR | Status: AC
  Filled 2016-05-07: qty 10

## 2016-05-07 MED ORDER — MIDAZOLAM HCL 2 MG/2ML IJ SOLN
INTRAMUSCULAR | Status: AC
  Filled 2016-05-07: qty 2

## 2016-05-07 MED ORDER — ACETAMINOPHEN 325 MG PO TABS: 650.0000 mg | ORAL_TABLET | ORAL | Status: DC | PRN

## 2016-05-07 MED ORDER — LACTATED RINGERS IV SOLN
INTRAVENOUS | Status: DC | PRN
  Administered 2016-05-07: 11:00:00 via INTRAVENOUS

## 2016-05-07 MED ORDER — ACETAMINOPHEN 500 MG PO TABS
1000.0000 mg | ORAL_TABLET | ORAL | Status: AC
Start: 2016-05-07 — End: 2016-05-07
  Administered 2016-05-07: 1000 mg via ORAL
  Filled 2016-05-07: qty 2

## 2016-05-07 MED ORDER — PROPOFOL 10 MG/ML IV BOLUS
INTRAVENOUS | Status: AC
  Filled 2016-05-07: qty 20

## 2016-05-07 MED ORDER — CEFAZOLIN SODIUM-DEXTROSE 2-4 GM/100ML-% IV SOLN
2.0000 g | INTRAVENOUS | Status: AC
  Administered 2016-05-07: 2 g via INTRAVENOUS
  Filled 2016-05-07: qty 100

## 2016-05-07 MED ORDER — SODIUM CHLORIDE 0.9 % IV SOLN: 250.0000 mL | INTRAVENOUS | Status: DC | PRN

## 2016-05-07 MED ORDER — DEXAMETHASONE SODIUM PHOSPHATE 10 MG/ML IJ SOLN
INTRAMUSCULAR | Status: AC
  Filled 2016-05-07: qty 1

## 2016-05-07 MED ORDER — SCOPOLAMINE 1 MG/3DAYS TD PT72
MEDICATED_PATCH | TRANSDERMAL | Status: AC
  Filled 2016-05-07: qty 2

## 2016-05-07 MED ORDER — EPHEDRINE 5 MG/ML INJ
INTRAVENOUS | Status: AC
  Filled 2016-05-07: qty 10

## 2016-05-07 MED ORDER — PHENYLEPHRINE 40 MCG/ML (10ML) SYRINGE FOR IV PUSH (FOR BLOOD PRESSURE SUPPORT)
PREFILLED_SYRINGE | INTRAVENOUS | Status: AC
  Filled 2016-05-07: qty 10

## 2016-05-07 MED ORDER — LIDOCAINE 2% (20 MG/ML) 5 ML SYRINGE
INTRAMUSCULAR | Status: AC
  Filled 2016-05-07: qty 5

## 2016-05-07 MED ORDER — OXYCODONE HCL 5 MG PO TABS
5.0000 mg | ORAL_TABLET | ORAL | Status: DC | PRN
  Administered 2016-05-07: 10 mg via ORAL

## 2016-05-07 SURGICAL SUPPLY — 56 items
ADH SKN CLS APL DERMABOND .7 (GAUZE/BANDAGES/DRESSINGS) ×1
APPLIER CLIP 9.375 MED OPEN (MISCELLANEOUS) ×3
APR CLP MED 9.3 20 MLT OPN (MISCELLANEOUS) ×1
BINDER BREAST LRG (GAUZE/BANDAGES/DRESSINGS) ×2 IMPLANT
BINDER BREAST XLRG (GAUZE/BANDAGES/DRESSINGS) IMPLANT
BLADE SURG 15 STRL LF DISP TIS (BLADE) ×1 IMPLANT
BLADE SURG 15 STRL SS (BLADE) ×3
CANISTER SUCTION 2500CC (MISCELLANEOUS) ×3 IMPLANT
CHLORAPREP W/TINT 26ML (MISCELLANEOUS) ×3 IMPLANT
CLIP APPLIE 9.375 MED OPEN (MISCELLANEOUS) ×1 IMPLANT
CONT SPEC 4OZ CLIKSEAL STRL BL (MISCELLANEOUS) ×3 IMPLANT
COVER PROBE W GEL 5X96 (DRAPES) ×3 IMPLANT
COVER SURGICAL LIGHT HANDLE (MISCELLANEOUS) ×3 IMPLANT
DERMABOND ADVANCED (GAUZE/BANDAGES/DRESSINGS) ×2
DERMABOND ADVANCED .7 DNX12 (GAUZE/BANDAGES/DRESSINGS) ×1 IMPLANT
DEVICE DUBIN SPECIMEN MAMMOGRA (MISCELLANEOUS) ×3 IMPLANT
DRAPE CHEST BREAST 15X10 FENES (DRAPES) ×3 IMPLANT
DRAPE PROXIMA HALF (DRAPES) ×3 IMPLANT
DRAPE UTILITY XL STRL (DRAPES) ×3 IMPLANT
DRSG PAD ABDOMINAL 8X10 ST (GAUZE/BANDAGES/DRESSINGS) ×3 IMPLANT
ELECT CAUTERY BLADE 6.4 (BLADE) ×3 IMPLANT
ELECT REM PT RETURN 9FT ADLT (ELECTROSURGICAL) ×3
ELECTRODE REM PT RTRN 9FT ADLT (ELECTROSURGICAL) ×1 IMPLANT
GAUZE SPONGE 4X4 12PLY STRL (GAUZE/BANDAGES/DRESSINGS) ×3 IMPLANT
GLOVE EUDERMIC 7 POWDERFREE (GLOVE) ×3 IMPLANT
GOWN STRL REUS W/ TWL LRG LVL3 (GOWN DISPOSABLE) ×1 IMPLANT
GOWN STRL REUS W/ TWL XL LVL3 (GOWN DISPOSABLE) ×1 IMPLANT
GOWN STRL REUS W/TWL LRG LVL3 (GOWN DISPOSABLE) ×3
GOWN STRL REUS W/TWL XL LVL3 (GOWN DISPOSABLE) ×3
ILLUMINATOR WAVEGUIDE N/F (MISCELLANEOUS) IMPLANT
KIT BASIN OR (CUSTOM PROCEDURE TRAY) ×3 IMPLANT
KIT MARKER MARGIN INK (KITS) ×3 IMPLANT
LIGHT WAVEGUIDE WIDE FLAT (MISCELLANEOUS) IMPLANT
NDL FILTER BLUNT 18X1 1/2 (NEEDLE) IMPLANT
NDL HYPO 25X1 1.5 SAFETY (NEEDLE) ×1 IMPLANT
NDL SAFETY ECLIPSE 18X1.5 (NEEDLE) IMPLANT
NEEDLE FILTER BLUNT 18X 1/2SAF (NEEDLE)
NEEDLE FILTER BLUNT 18X1 1/2 (NEEDLE) IMPLANT
NEEDLE HYPO 18GX1.5 SHARP (NEEDLE)
NEEDLE HYPO 25X1 1.5 SAFETY (NEEDLE) ×3 IMPLANT
NS IRRIG 1000ML POUR BTL (IV SOLUTION) ×3 IMPLANT
PACK SURGICAL SETUP 50X90 (CUSTOM PROCEDURE TRAY) ×3 IMPLANT
PENCIL BUTTON HOLSTER BLD 10FT (ELECTRODE) ×3 IMPLANT
SPONGE LAP 18X18 X RAY DECT (DISPOSABLE) IMPLANT
SPONGE LAP 4X18 X RAY DECT (DISPOSABLE) ×3 IMPLANT
SUT MNCRL AB 4-0 PS2 18 (SUTURE) ×3 IMPLANT
SUT SILK 2 0 SH (SUTURE) IMPLANT
SUT VIC AB 2-0 FS1 27 (SUTURE) ×2 IMPLANT
SUT VIC AB 3-0 SH 18 (SUTURE) ×3 IMPLANT
SYR BULB 3OZ (MISCELLANEOUS) ×3 IMPLANT
SYR CONTROL 10ML LL (SYRINGE) ×3 IMPLANT
TOWEL OR 17X24 6PK STRL BLUE (TOWEL DISPOSABLE) ×3 IMPLANT
TOWEL OR 17X26 10 PK STRL BLUE (TOWEL DISPOSABLE) ×3 IMPLANT
TUBE CONNECTING 12'X1/4 (SUCTIONS) ×1
TUBE CONNECTING 12X1/4 (SUCTIONS) ×2 IMPLANT
YANKAUER SUCT BULB TIP NO VENT (SUCTIONS) ×3 IMPLANT

## 2016-05-07 NOTE — Op Note (Signed)
Patient Name:           Cheryl [name]   Date of Surgery:        05/07/2016  Pre op Diagnosis:      Invasive lobular carcinoma left breast, lower outer quadrant, estrogen receptor positive, clinical stage TIc, N0.  Post op Diagnosis:    Same  Procedure:                 Inject blue dye left breast                                      Left breast lumpectomy with radioactive seed localization                                      Left axillary deep  sentinel lymph node mapping and biopsy  Surgeon:                     Edsel Petrin. Dalbert Batman, M.D., FACS  Assistant:                      OR staff   Indication for Assistant: n/a  Operative Indications:   This is a very pleasant 66 year old female who returns with her mother to have a final discussion and to schedule definitive surgery for her left breast cancer. Dr. Reynaldo Minium is her PCP. Dr. Benay Spice has been her oncologist for many years. She has seen Dr. Everitt Amber about elective hysterectomy and nephrectomy which is planned electively in the future.       Past history is significant for right modified radical mastectomy by Dr. Margot Chimes with TRAM flap reconstruction by Dr. Stephanie Coup, 1996. Positive nodes and a stage III cancer. Received radiation therapy and tamoxifen for 5 years and Femara for 5 years and has had no local recurrence. Chronic scar tissue medially on the right side.       Recent screening mammogram showed a 1.4 cm mass in the left breast 4:30 position 8 cm from the nipple. Axilla normal. Image guided biopsy shows grade 1 invasive lobular carcinoma, receptor positive, HER-2 negative. Recent genetic testing is negative      Bilateral breast MRI shows a solitary 1.3 cm mass in the lower outer quadrant of the left breast but no other masses or abnormal areas of enhancement in the reconstructed right breast. No abnormal lymph nodes. She has seen Dr. Benay Spice for a preop visit. She is now motivated for breast conservation surgery, and I  think she is a good candidate for that. She knows she will most likely be offered radiation therapy postop.       She will be scheduled for left breast lumpectomy with radioactive seed localization and left axillary sentinel lymph node biopsy. I discussed the indications, details, techniques, and numerous risk of the surgery with her and her mother. She is aware of the risk of bleeding, infection, cosmetic deformity, reoperation for positive margins, reoperations if multiple positive nodes, arm numbness, arm swelling, shoulder disability. She understands all these issues well. All of her questions were answered. She agrees with this plan. We will try to get the surgery done in early January.  Operative Findings:       The radioactive seed signal was audible in the expected location  using the neoprobe  in the holding area.  Technetium radionuclide was injected into the left breast in the holding area by the nuclear medicine technician.  In the operating room the lumpectomy went well with the tumor at about the 6:00 position.  The specimen mammogram looked very good with the initial biopsy marker clip and radioactive seed in the center of the specimen.  The tumor was relatively posterior so I took the lumpectomy all the way back to the pectoralis muscle.  The broad posterior margin is the pectoralis muscle.  I found 2 sentinel lymph nodes which were very blue and slightly hot.  Procedure in Detail:          Following the induction of general LMA anesthesia a surgical timeout was performed.  Intravenous antibiotics were given.  Following alcohol prep I injected dilute methylene blue into the left breast subareolar area and massaged the breast for a few minutes      The left breast and chest wall and axilla were then prepped and draped in a sterile fashion.  0.5% Marcaine with epinephrine was used as a local infiltration anesthetic.  Using a neoprobe as a guide I designed a radially oriented incision at  about the 6:00 position.  This incision was made and the lumpectomy was performed with cautery using the neoprobe as a guide frequently.  Specimen mammogram looked good as described above.  The wound was irrigated.  Hemostasis was excellent.  5 metallic clips were placed in the lumpectomy cavity margins to orient the radiation oncologist.  The breast tissues were reapproximated with 2-0 Vicryl and 3-0 Vicryl and the skin incision closed with a running subcuticular 4-0 Monocryl suture with and Dermabond.      A transverse incision was made in the left axilla at the hairline in a skin crease.  Dissection was carried down through the clavipectoral fascia and the axillary space entered.  I found 2 sentinel lymph nodes which were very blue and had slight radioactivity.  There were no other sentinel nodes.  The wound was irrigated.  Hemostasis excellent.  One small bleeder controlled with metal clips.  The clavipectoral fascia was reapproximated with Vicryl sutures and the skin closed with a running subcuticular 4-0 Monocryl and Dermabond.  4 x 4's, ABDs, and an elastic breast binder were placed.  The patient tolerated the procedure well was taken to PACU in stable condition.  EBL 20 mL.  Counts correct.  Complications none.         Edsel Petrin. Dalbert Batman, M.D., FACS General and Minimally Invasive Surgery Breast and Colorectal Surgery  05/07/2016 12:21 PM

## 2016-05-07 NOTE — Discharge Instructions (Signed)
[facility] °Office Phone Number [phone] ° °BREAST BIOPSY/ PARTIAL MASTECTOMY: POST OP INSTRUCTIONS ° °Always review your discharge instruction sheet given to you by the facility where your surgery was performed. ° °IF YOU HAVE DISABILITY OR FAMILY LEAVE FORMS, YOU MUST BRING THEM TO THE OFFICE FOR PROCESSING.  DO NOT GIVE THEM TO YOUR DOCTOR. ° °1. A prescription for pain medication may be given to you upon discharge.  Take your pain medication as prescribed, if needed.  If narcotic pain medicine is not needed, then you may take acetaminophen (Tylenol) or ibuprofen (Advil) as needed. °2. Take your usually prescribed medications unless otherwise directed °3. If you need a refill on your pain medication, please contact your pharmacy.  They will contact our office to request authorization.  Prescriptions will not be filled after 5pm or on week-ends. °4. You should eat very light the first 24 hours after surgery, such as soup, crackers, pudding, etc.  Resume your normal diet the day after surgery. °5. Most patients will experience some swelling and bruising in the breast.  Ice packs and a good support bra will help.  Swelling and bruising can take several days to resolve.  °6. It is common to experience some constipation if taking pain medication after surgery.  Increasing fluid intake and taking a stool softener will usually help or prevent this problem from occurring.  A mild laxative (Milk of Magnesia or Miralax) should be taken according to package directions if there are no bowel movements after 48 hours. °7. Unless discharge instructions indicate otherwise, you may remove your bandages 24-48 hours after surgery, and you may shower at that time.  You may have steri-strips (small skin tapes) in place directly over the incision.  These strips should be left on the skin for 7-10 days.  If your surgeon used skin glue on the incision, you may shower in 24 hours.  The glue will flake off over the  next 2-3 weeks.  Any sutures or staples will be removed at the office during your follow-up visit. °8. ACTIVITIES:  You may resume regular daily activities (gradually increasing) beginning the next day.  Wearing a good support bra or sports bra minimizes pain and swelling.  You may have sexual intercourse when it is comfortable. °a. You may drive when you no longer are taking prescription pain medication, you can comfortably wear a seatbelt, and you can safely maneuver your car and apply brakes. °b. RETURN TO WORK:  ______________________________________________________________________________________ °9. You should see your doctor in the office for a follow-up appointment approximately two weeks after your surgery.  Your doctor’s nurse will typically make your follow-up appointment when she calls you with your pathology report.  Expect your pathology report 2-3 business days after your surgery.  You may call to check if you do not hear from us after three days. °10. OTHER INSTRUCTIONS: _______________________________________________________________________________________________ _____________________________________________________________________________________________________________________________________ °_____________________________________________________________________________________________________________________________________ °_____________________________________________________________________________________________________________________________________ ° °WHEN TO CALL YOUR DOCTOR: °1. Fever over 101.0 °2. Nausea and/or vomiting. °3. Extreme swelling or bruising. °4. Continued bleeding from incision. °5. Increased pain, redness, or drainage from the incision. ° °The clinic staff is available to answer your questions during regular business hours.  Please don’t hesitate to call and ask to speak to one of the nurses for clinical concerns.  If you have a medical emergency, go to the nearest  emergency room or call 911.  A surgeon from [facility] is always on call at the hospital. ° °For further questions, please visit [email or web address]  °

## 2016-05-07 NOTE — Interval H&P Note (Signed)
History and Physical Interval Note:  05/07/2016 10:16 AM  Cheryl [name]  has presented today for surgery, with the diagnosis of LEFT BREAST CANCER  The various methods of treatment have been discussed with the patient and family. After consideration of risks, benefits and other options for treatment, the patient has consented to  Procedure(s): LEFT BREAST LUMPECTOMY WITH RADIOACTIVE SEED AND LEFT AXILLARY SENTINEL LYMPH NODE BIOPSY (Left) as a surgical intervention .  The patient's history has been reviewed, patient examined, no change in status, stable for surgery.  I have reviewed the patient's chart and labs.  Questions were answered to the patient's satisfaction.     Adin Hector

## 2016-05-07 NOTE — Anesthesia Procedure Notes (Signed)
Procedure Name: Intubation Date/Time: 05/07/2016 11:12 AM Performed by: Tressia Miners LEFFEW Pre-anesthesia Checklist: Patient identified, Patient being monitored, Timeout performed, Emergency Drugs available and Suction available Patient Re-evaluated:Patient Re-evaluated prior to inductionOxygen Delivery Method: Circle System Utilized Preoxygenation: Pre-oxygenation with 100% oxygen Intubation Type: IV induction Ventilation: Mask ventilation without difficulty Laryngoscope Size: Mac and 3 Grade View: Grade II Tube type: Oral Tube size: 7.5 mm Number of attempts: 1 Airway Equipment and Method: Bougie stylet Placement Confirmation: ETT inserted through vocal cords under direct vision,  positive ETCO2 and breath sounds checked- equal and bilateral Secured at: 22 cm Tube secured with: Tape Dental Injury: Teeth and Oropharynx as per pre-operative assessment

## 2016-05-07 NOTE — Anesthesia Postprocedure Evaluation (Signed)
Anesthesia Post Note  Patient: Cheryl [name]  Procedure(s) Performed: Procedure(s) (LRB): LEFT BREAST LUMPECTOMY WITH RADIOACTIVE SEED AND LEFT AXILLARY SENTINEL LYMPH NODE BIOPSY (Left)  Patient location during evaluation: PACU Anesthesia Type: General Level of consciousness: awake Pain management: pain level controlled Vital Signs Assessment: post-procedure vital signs reviewed and stable Respiratory status: spontaneous breathing Cardiovascular status: stable Anesthetic complications: no       Last Vitals:  Vitals:   05/07/16 1330 05/07/16 1345  BP: 131/70 131/72  Pulse: 63 63  Resp: 17 15  Temp:      Last Pain:  Vitals:   05/07/16 1400  TempSrc:   PainSc: 0-No pain                 Amela Handley

## 2016-05-07 NOTE — Transfer of Care (Signed)
Immediate Anesthesia Transfer of Care Note  Patient: Cheryl [name]  Procedure(s) Performed: Procedure(s): LEFT BREAST LUMPECTOMY WITH RADIOACTIVE SEED AND LEFT AXILLARY SENTINEL LYMPH NODE BIOPSY (Left)  Patient Location: PACU  Anesthesia Type:General  Level of Consciousness: awake, alert , patient cooperative and responds to stimulation  Airway & Oxygen Therapy: Patient Spontanous Breathing and Patient connected to face mask oxygen  Post-op Assessment: Report given to RN, Post -op Vital signs reviewed and stable and Patient moving all extremities X 4  Post vital signs: Reviewed and stable  Last Vitals:  Vitals:   05/07/16 0920  BP: 132/80  Pulse: 74  Resp: 20  Temp: 36.9 C    Last Pain:  Vitals:   05/07/16 0920  TempSrc: Oral         Complications: No apparent anesthesia complications

## 2016-05-08 ENCOUNTER — Encounter (HOSPITAL_COMMUNITY): Payer: Self-pay | Admitting: General Surgery

## 2016-05-08 NOTE — Anesthesia Postprocedure Evaluation (Signed)
Anesthesia Post Note  Patient: Cheryl [name]  Procedure(s) Performed: Procedure(s) (LRB): LEFT BREAST LUMPECTOMY WITH RADIOACTIVE SEED AND LEFT AXILLARY SENTINEL LYMPH NODE BIOPSY (Left)  Patient location during evaluation: PACU Anesthesia Type: General Level of consciousness: awake Pain management: pain level controlled Vital Signs Assessment: post-procedure vital signs reviewed and stable Respiratory status: spontaneous breathing Cardiovascular status: stable Anesthetic complications: no       Last Vitals:  Vitals:   05/07/16 1330 05/07/16 1345  BP: 131/70 131/72  Pulse: 63 63  Resp: 17 15  Temp:      Last Pain:  Vitals:   05/07/16 1400  TempSrc:   PainSc: 0-No pain                 Tian Mcmurtrey

## 2016-05-08 NOTE — Anesthesia Preprocedure Evaluation (Signed)
Anesthesia Evaluation  Patient identified by MRN, date of birth, ID band Patient awake    Reviewed: Allergy & Precautions, NPO status   Airway Mallampati: II  TM Distance: >3 FB     Dental   Pulmonary neg pulmonary ROS,    breath sounds clear to auscultation       Cardiovascular negative cardio ROS   Rhythm:Regular Rate:Normal     Neuro/Psych  Headaches,    GI/Hepatic hiatal hernia, GERD  ,(+) Hepatitis -  Endo/Other    Renal/GU Renal disease     Musculoskeletal   Abdominal   Peds  Hematology   Anesthesia Other Findings   Reproductive/Obstetrics                             Anesthesia Physical Anesthesia Plan  ASA: III  Anesthesia Plan: General   Post-op Pain Management:    Induction: Intravenous  Airway Management Planned: Oral ETT  Additional Equipment:   Intra-op Plan:   Post-operative Plan: Extubation in OR  Informed Consent: I have reviewed the patients History and Physical, chart, labs and discussed the procedure including the risks, benefits and alternatives for the proposed anesthesia with the patient or authorized representative who has indicated his/her understanding and acceptance.   Dental advisory given  Plan Discussed with: CRNA and Anesthesiologist  Anesthesia Plan Comments:         Anesthesia Quick Evaluation

## 2016-05-09 MED FILL — CEPHALEXIN 500 MG CAPSULE: 500 | 10 days supply | Qty: 40 | Fill #0

## 2016-05-12 NOTE — Progress Notes (Signed)
Inform patient of Pathology report,. Tell her that the tumor was 1.5 cm diameter.  Resection margins are negative.  2 sentinel lymph nodes were removed and both are negative for cancer This is good news. She will not need any further surgery I will discuss this in detail with her at her next office visit Make sure she has appointments with medical oncology and radiation oncology Let me know that she contacted her.  Thanks hmi

## 2016-05-15 ENCOUNTER — Encounter: Payer: Self-pay | Admitting: Radiation Oncology

## 2016-05-15 ENCOUNTER — Telehealth: Payer: Self-pay | Admitting: *Deleted

## 2016-05-15 ENCOUNTER — Telehealth: Payer: Self-pay | Admitting: Oncology

## 2016-05-15 ENCOUNTER — Ambulatory Visit (HOSPITAL_BASED_OUTPATIENT_CLINIC_OR_DEPARTMENT_OTHER): Payer: Medicare Other | Admitting: Oncology

## 2016-05-15 ENCOUNTER — Encounter: Payer: Self-pay | Admitting: *Deleted

## 2016-05-15 VITALS — BP 125/64 | HR 67 | Temp 97.7°F | Resp 18 | Ht 60.0 in | Wt 140.6 lb

## 2016-05-15 DIAGNOSIS — M8580 Other specified disorders of bone density and structure, unspecified site: Secondary | ICD-10-CM

## 2016-05-15 DIAGNOSIS — C50512 Malignant neoplasm of lower-outer quadrant of left female breast: Secondary | ICD-10-CM

## 2016-05-15 DIAGNOSIS — Z170 Estrogen receptor positive status [ER+]: Secondary | ICD-10-CM

## 2016-05-15 DIAGNOSIS — Z853 Personal history of malignant neoplasm of breast: Secondary | ICD-10-CM | POA: Diagnosis not present

## 2016-05-15 NOTE — Progress Notes (Signed)
  [facility] OFFICE PROGRESS NOTE   Diagnosis: breast cancer  INTERVAL HISTORY:   Ms. Lipson returns as scheduled.she underwent a left lumpectomy and left axillary sentinel lymph node biopsy by Dr. Dalbert Batman on 05/07/2016. The pathology confirmed a 1.5 cm lobular carcinoma with 2 negative sentinel lymph nodes.the resection margins were negative. She has noted numbness at the left upper arm following surgery.She developed erythema of the right arm last week with associated chills.She was prescribed Keflex and the erythema resolved overseveral days.    Objective:  Vital signs in last 24 hours:  Blood pressure 125/64, pulse 67, temperature 97.7 F (36.5 C), temperature source Oral, resp. rate 18, height 5' (1.524 m), weight 140 lb 9.6 oz (63.8 kg), SpO2 100 %.    HEENT: neck without mass Lymphatics: no cervical, supraclavicular, or axillary nodes Resp: lungs clear bilaterally Cardio: regular rate and rhythm GI: no hepatosplenomegaly Vascular: edema of the right arm  breasts:status post left lumpectomy with a healing surgical incision,healing left axillary incision with mild induration Skin:no right arm edema    Medications: I have reviewed the patient's current medications.  Assessment/Plan: 1.Stage III right-sided breast cancer diagnosed in 1999. She remains in clinical remission. She completed adjuvant Femara therapy at the end of August 2009.  2. Left renal lesion on CT scan in April 2008 - a renal ultrasound confirmed bilateral renal cysts.  3. Osteopenia - she continues vitamin D.  4. 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  5. BRCA2 variant of unclear clinical significance, with a significant family history of breast cancer. Negative breast/ovarian gene panel 06/15/2015 6. History of hematuria - reports being diagnosed with a renal stone by Dr Diona Fanti.  7. Left breast cancer-invasive lobular carcinoma, HER-2 negative, ER positive, PR  positive, Ki-20 percent confirmed on a core biopsy of a left lower outer quadrant mass on 03/10/2016  Left lumpectomy an sentinel lymph node biopsy01/17/2018,1.5 cm lobular carcinoma, grade 2,pT1c,pN0   Disposition:  Ms. Haupert has been diagnosed with stage I left-sided breast. She has a good prognosis for a long-term disease free survival. We discussed the indications for adjuvant therapy.  I discussed the expected benefit associated with adjuvant hormonal therapy. I recommend adjuvant aroma inhibitor therapy I will refer her to Dr. Sondra Come to consider breast radiation. We will plan to begin hormonal therapy at the completion of Radiation.  We will submit the tumor for Oncotype testing, though I think it is unlikely she will fall into a high risk categoryann have a significant expected benefit from adjuvant chemotherapy.  She will return for an office visit in one month. We will see her sooner pending the Oncotype testing result.   Betsy Coder, MD  05/15/2016  8:28 PM

## 2016-05-15 NOTE — Telephone Encounter (Signed)
Appointments scheduled per 1/25 LOS. Patient given AVS report and calendars with future scheduled appointments. °

## 2016-05-15 NOTE — Telephone Encounter (Signed)
Ordered oncotype per Dr. Benay Spice.  Faxed requisition to pathology and confirmed receipt.

## 2016-05-20 ENCOUNTER — Other Ambulatory Visit: Payer: Self-pay | Admitting: *Deleted

## 2016-05-20 MED ORDER — ROSUVASTATIN CALCIUM 40 MG PO TABS: 40.0000 mg | ORAL_TABLET | Freq: Every day | ORAL | 0 refills | Status: DC

## 2016-05-20 MED FILL — ROSUVASTATIN CALCIUM 40 MG: 40 | 30 days supply | Qty: 30 | Fill #0

## 2016-05-23 ENCOUNTER — Encounter (HOSPITAL_COMMUNITY): Payer: Self-pay

## 2016-05-23 DIAGNOSIS — C50512 Malignant neoplasm of lower-outer quadrant of left female breast: Secondary | ICD-10-CM | POA: Diagnosis not present

## 2016-05-23 NOTE — Progress Notes (Signed)
Location of Breast Cancer: left lower outer quadrant   Histology per Pathology Report:   05/07/16 Diagnosis 1. Breast, lumpectomy, left - INVASIVE LOBULAR CARCINOMA, GRADE 2, SPANNING 1.5 CM. - LOBULAR NEOPLASIA (LOBULAR CARCINOMA IN SITU). - RESECTION MARGINS ARE NEGATIVE. - INTRADUCTAL PAPILLOMA WITH CALCIFICATION. - BIOPSY SITE. - SEE ONCOLOGY TABLE. 2. Lymph node, sentinel, biopsy, left axillary #1 - ONE OF ONE LYMPH NODE NEGATIVE FOR CARCINOMA (0/1). 3. Lymph node, sentinel, biopsy, left axillary #2 - ONE OF ONE LYMPH NODE NEGATIVE FOR CARCINOMA (0/1).  03/10/16 Diagnosis Breast, left, needle core biopsy, 4:30 o'clock - INVASIVE MAMMARY CARCINOMA. - SEE COMMENT.  Receptor Status: ER(100%), PR (5%), Her2-neu (negative), Ki-(20%)  Did patient present with symptoms (if so, please note symptoms) or was this found on screening mammography?: screening mammography  Past/Anticipated interventions by surgeon, if any: 05/07/16 - Procedure: LEFT BREAST LUMPECTOMY WITH RADIOACTIVE SEED AND LEFT AXILLARY SENTINEL LYMPH NODE BIOPSY;  Surgeon: Fanny Skates, MD   Past/Anticipated interventions by medical oncology, if any: adjuvant hormonal therapy   Lymphedema issues, if any: yes - has in her right arm.  Pain issues, if any:  no   SAFETY ISSUES:  Prior radiation? Yes - in 1999 to her right breast with Dr. Valere Dross  Pacemaker/ICD? no  Possible current pregnancy?no  Is the patient on methotrexate? no  Current Complaints / other details:  Patient has a history of right breast cancer s/p mastectomy in 1999. She has an indeterminant BRCA 2 mutation.  She is here with mother.  BP 131/70 (BP Location: Left Arm, Patient Position: Sitting)   Pulse 72   Temp 98.8 F (37.1 C) (Oral)   Ht 5' (1.524 m)   Wt 139 lb 3.2 oz (63.1 kg)   SpO2 100%   BMI 27.19 kg/m    Wt Readings from Last 3 Encounters:  05/29/16 139 lb 3.2 oz (63.1 kg)  05/15/16 140 lb 9.6 oz (63.8 kg)  05/07/16 141 lb  (64 kg)      Jacqulyn Liner, RN 05/23/2016,12:34 PM

## 2016-05-27 ENCOUNTER — Telehealth: Payer: Self-pay | Admitting: *Deleted

## 2016-05-27 NOTE — Telephone Encounter (Signed)
Received Oncotype score of 17. Per Dr. Benay Spice pt does not need chemotherapy. Left vm for pt to return call. Contact information provided.

## 2016-05-27 NOTE — Telephone Encounter (Signed)
Gave pt results of oncotype test (17). Informed pt that she does NOT need chemotherapy per Dr. Benay Spice. Received verbal understanding. Confirmed appt with Dr. Sondra Come on 2/8.

## 2016-05-29 ENCOUNTER — Encounter: Payer: Self-pay | Admitting: Radiation Oncology

## 2016-05-29 ENCOUNTER — Ambulatory Visit
Admission: RE | Admit: 2016-05-29 | Discharge: 2016-05-29 | Disposition: A | Discharge status: Home / Self Care | Payer: Medicare Other | Source: Ambulatory Visit | Attending: Radiation Oncology | Admitting: Radiation Oncology

## 2016-05-29 VITALS — BP 131/70 | HR 72 | Temp 98.8°F | Ht 60.0 in | Wt 139.2 lb

## 2016-05-29 DIAGNOSIS — Z170 Estrogen receptor positive status [ER+]: Secondary | ICD-10-CM | POA: Insufficient documentation

## 2016-05-29 DIAGNOSIS — Z853 Personal history of malignant neoplasm of breast: Secondary | ICD-10-CM | POA: Diagnosis not present

## 2016-05-29 DIAGNOSIS — Z510 Encounter for antineoplastic radiation therapy: Secondary | ICD-10-CM | POA: Insufficient documentation

## 2016-05-29 DIAGNOSIS — Z9889 Other specified postprocedural states: Secondary | ICD-10-CM | POA: Diagnosis not present

## 2016-05-29 DIAGNOSIS — C50512 Malignant neoplasm of lower-outer quadrant of left female breast: Secondary | ICD-10-CM | POA: Diagnosis present

## 2016-05-29 NOTE — Addendum Note (Signed)
Encounter addended by: Gery Pray, MD on: 05/29/2016  6:54 PM<BR>    Actions taken: Order list changed, Diagnosis association updated

## 2016-05-29 NOTE — Progress Notes (Signed)
Radiation Oncology         ([phone]) [phone] ________________________________  Initial Outpatient Consultation  Name: Cheryl [name] MRN: [id number]  Date: 05/29/2016  DOB: 1951-02-28  OE:[id number],GNOIBBC A, MD  Ladell Pier, MD   REFERRING PHYSICIAN: Ladell Pier, MD  DIAGNOSIS:  Stage pT1c, pN0 Invasive lobular carcinoma of the LOQ Left Breast, ER Positive, status post lumpectomy.  HISTORY OF PRESENT ILLNESS::Cheryl [name] is a 66 y.o. female who is seen at the request of Dr. Benay Spice. She has a history of stage III right breast cancer, with a recently diagnosed left sided lesion, and an indeterminate BRCA 2 mutation.  The patient initially presented with a possible asymmetry noted in a mammogram on 02/29/16. A follow up mammogram on 03/05/16 confirmed a 1.5 cm mass in the lower outer quadrant of the left breast. An ultrasound confirmed a hypoechoic irregular mass at the 4:30 position of the left breast measuring 1.4 x 1.3 x 1.0 cm, with no pathologic axillary adenopathy.  Core biopsy obtained on 03/10/16 confirmed invasive mammary carcinoma consistent with lobular phenotype. The tumor was ER/PR positive, Her2 negative.  The patient underwent left breast lumpectomy with radioactive seed and left axillary sentinel node biopsy on 05/07/16 with Dr. Dalbert Batman. Both lymph nodes sampled were negative for malignancy. Resection margins were negative. Final tumor measured 1.5 cm and therefore pT1c, pN0.  Dr. Benay Spice ordered Oncotype testing for this patient, revealing a score of 17. The patient does not require systemic chemotherapy at this time.  On review of systems, the patient reports lymphedema in her right arm. She is wearing a compression sleeve today. She denies pain at this time. She reports chronic migraine headaches since she was 66 years old.  The patient presents to the clinic today to discuss the role that radiation therapy may play in the treatment of her disease. She is  accompanied by her mother.  PREVIOUS RADIATION THERAPY: Yes  1999 : Radiation to the Right reconstructed chest area with Dr. Valere Dross  PAST MEDICAL HISTORY:  has a past medical history of Arthritis; Breast cancer ([place]) (1999); Breast cancer of lower-outer quadrant of left female breast ([place]) (05/07/2016); Cellulitis; Chronic acquired lymphedema; GERD (gastroesophageal reflux disease); Headache; Hepatitis (1970's); History of hiatal hernia; History of kidney stones; History of migraine; Hyperlipidemia; Kidney problem (1969); Osteopenia (02/2015); Strep throat; and Ventricular ectopy.    PAST SURGICAL HISTORY: Past Surgical History:  Procedure Laterality Date  . BREAST LUMPECTOMY WITH RADIOACTIVE SEED AND SENTINEL LYMPH NODE BIOPSY Left 05/07/2016   Procedure: LEFT BREAST LUMPECTOMY WITH RADIOACTIVE SEED AND LEFT AXILLARY SENTINEL LYMPH NODE BIOPSY;  Surgeon: Fanny Skates, MD;  Location: [facility];  Service: General;  Laterality: Left;  . BREAST SURGERY     Mastectomy with TRAM  . CYSTOGRAM    . FINGER SURGERY Right 01/1999   finger on the hand  . FOOT SURGERY  2004,2015  . FOOT SURGERY Left 07/2005   3 toes  . left thumb  1/06  . right mastectomy  10-27-97   with tram flap  . right thumb  1/05  . TONSILLECTOMY AND ADENOIDECTOMY      FAMILY HISTORY: family history includes Breast cancer in her other, other, and other; Breast cancer (age of onset: 60) in her mother; Dementia in her father; Heart attack in her paternal grandfather; Heart disease in her father; Hyperlipidemia in her sister; Hypertension in her father; Prostate cancer in her other; Tuberculosis in her paternal grandmother.  SOCIAL HISTORY:  reports that  she has never smoked. She has never used smokeless tobacco. She reports that she drinks alcohol. She reports that she does not use drugs.  ALLERGIES: Banthine [methantheline]; Demerol; and Theophyllines  MEDICATIONS:  Current Outpatient Prescriptions  Medication Sig Dispense  Refill  . cephALEXin (KEFLEX) 500 MG capsule Take 500 mg by mouth 4 (four) times daily as needed (take as directed when cellulitis flares up). Four times daily for 3 weeks for cellulitis flare up due to lymphedema    . Coenzyme Q10 (COQ10) 100 MG CAPS Take 100 mg by mouth daily.    Marland Kitchen esomeprazole (NEXIUM) 20 MG capsule Take 20 mg by mouth daily before breakfast.     . ezetimibe (ZETIA) 10 MG tablet Take 1 tablet (10 mg total) by mouth daily. *Patient is overdue for an appointment. Please call and schedule for further refills* 30 tablet 0  . fexofenadine (ALLEGRA) 180 MG tablet Take 180 mg by mouth daily as needed (for sinus/allergies.).    Marland Kitchen propranolol (INDERAL LA) 80 MG 24 hr capsule Take 80 mg by mouth daily with breakfast.     . rizatriptan (MAXALT) 10 MG tablet Take 10 mg by mouth as needed. May repeat in 2 hours if needed    . rosuvastatin (CRESTOR) 40 MG tablet Take 1 tablet (40 mg total) by mouth daily. 30 tablet 0  . VITAMIN D, ERGOCALCIFEROL, PO Take 2-3 drops by mouth 2 (two) times daily. 3 drops in the morning & 2 drops in the evening    . aspirin EC 81 MG tablet Take 1 tablet (81 mg total) by mouth daily.     No current facility-administered medications for this encounter.     REVIEW OF SYSTEMS:  A 15 point review of systems is documented in the electronic medical record. This was obtained by the nursing staff. However, I reviewed this with the patient to discuss relevant findings and make appropriate changes.  Pertinent items are noted in HPI.   PHYSICAL EXAM:  height is 5' (1.524 m) and weight is 139 lb 3.2 oz (63.1 kg). Her oral temperature is 98.8 F (37.1 C). Her blood pressure is 131/70 and her pulse is 72. Her oxygen saturation is 100%.   General: Alert and oriented, in no acute distress. HEENT: Oral cavity is clear. EOMI. Neck: Neck is supple, no palpable cervical or supraclavicular lymphadenopathy. Heart: Regular in rate and rhythm with no murmurs, rubs, or  gallops. Chest: Clear to auscultation bilaterally, with no rhonchi, wheezes, or rales. Abdomen: Soft, nontender, nondistended, with no rigidity or guarding. Normal active bowel sounds. Extremities: Significant edema in the right arm with lymphedema sleeve in place. No swelling in ankles or feet. Lymphatics: see Neck Exam Skin: see Breast Exam. Musculoskeletal: symmetric strength and muscle tone throughout. Breast: Reconstructed right breast and some scar tissue along the medial aspect of reconstruction, with no palpable or visible signs of recurrence. Left breast shows scar in lower outer quadrant which is healing well without signs of drainage or infection. A second scar in the left axillary region is also healing well without drainage or infection. Mild erythema to the inferior aspect of the left breast.   ECOG = 1   LABORATORY DATA:  Lab Results  Component Value Date   WBC 6.4 04/29/2016   HGB 14.5 04/29/2016   HCT 41.1 04/29/2016   MCV 90.7 04/29/2016   PLT 187 04/29/2016   NEUTROABS 4.1 04/29/2016   Lab Results  Component Value Date   NA 141 04/29/2016  K 4.1 04/29/2016   CL 108 04/29/2016   CO2 23 04/29/2016   GLUCOSE 118 (H) 04/29/2016   CREATININE 0.69 04/29/2016   CALCIUM 10.1 04/29/2016      RADIOGRAPHY: Mm Breast Surgical Specimen  Result Date: 05/07/2016 CLINICAL DATA:  Biopsy-proven invasive lobular carcinoma involving the lower outer quadrant of the left breast. EXAM: SPECIMEN RADIOGRAPH OF THE LEFT BREAST COMPARISON:  Previous exam(s). FINDINGS: Status post excision of the left breast. The ribbon shaped tissue marker clip and the radioactive seed are present in the specimen, completely intact, and were marked for pathology. This was discussed with the operating room nurse at the time of interpretation on 05/07/2016 at 11:45 a.m. IMPRESSION: Specimen radiograph of the left breast. Electronically Signed   By: Evangeline Dakin M.D.   On: 05/07/2016 11:47   Mm Lt  Radioactive Seed Loc Mammo Guide  Result Date: 05/05/2016 CLINICAL DATA:  Breast cancer.  Surgery. EXAM: MAMMOGRAPHIC GUIDED RADIOACTIVE SEED LOCALIZATION OF THE LEFT BREAST COMPARISON:  Previous exam(s). FINDINGS: Patient presents for radioactive seed localization prior to surgery for a left breast mass. I met with the patient and we discussed the procedure of seed localization including benefits and alternatives. We discussed the high likelihood of a successful procedure. We discussed the risks of the procedure including infection, bleeding, tissue injury and further surgery. We discussed the low dose of radioactivity involved in the procedure. Informed, written consent was given. The usual time-out protocol was performed immediately prior to the procedure. Using mammographic guidance, sterile technique, 1% lidocaine and an I-125 radioactive seed, the left breast mass and biopsy clip were localized using a lateral approach. The follow-up mammogram images confirm the seed in the expected location and were marked for the surgeon. Follow-up survey of the patient confirms presence of the radioactive seed. Order number of I-125 seed:  [id number]. Total activity:  5.809 millicuries  Reference Date: March 28, 2016 The patient tolerated the procedure well and was released from the [facility]. She was given instructions regarding seed removal. IMPRESSION: Radioactive seed localization left breast. No apparent complications. Electronically Signed   By: Dorise Bullion III M.D   On: 05/05/2016 13:22      IMPRESSION: Stage pT1c, pN0 Invasive lobular carcinoma of the Left Breast, ER Positive, status post lumpectomy. The patient would be a good candidate for breast conservation therapy. In light of the patient's previous radiation therapy with potential overlap over the sternum,  I would not recommend hypo-fractionated accelerated radiation therapy in her situation. She would however be a good candidate for  conventional radiation therapy.  We discussed the risks, benefits, and side effects of radiation therapy, as well as the logistics for the patient's education. The patient is interested in proceeding with radiation therapy. A consent form was discussed, signed, and placed in the patient's chart.  PLAN:  She will be scheduled for CT Simulation and treatment planning with treatments to begin approximately 6 weeks postop.  I will refer the patient to the lymphedema clinic for evaluation of the lymphedema in her right arm to see if any new techniques or equipment maybe of benefit for her significant edema.  ------------------------------------------------  Blair Promise, PhD, MD  This document serves as a record of services personally performed by Gery Pray, MD. It was created on his behalf by Maryla Morrow, a trained medical scribe. The creation of this record is based on the scribe's personal observations and the provider's statements to them. This document has been checked and approved  by the attending provider.

## 2016-05-29 NOTE — Progress Notes (Signed)
Please see the Nurse Progress Note in the MD Initial Consult Encounter for this patient. 

## 2016-06-03 ENCOUNTER — Ambulatory Visit: Payer: Medicare Other | Attending: Radiation Oncology | Admitting: Physical Therapy

## 2016-06-03 DIAGNOSIS — L599 Disorder of the skin and subcutaneous tissue related to radiation, unspecified: Secondary | ICD-10-CM | POA: Insufficient documentation

## 2016-06-03 DIAGNOSIS — I972 Postmastectomy lymphedema syndrome: Secondary | ICD-10-CM

## 2016-06-03 DIAGNOSIS — R293 Abnormal posture: Secondary | ICD-10-CM | POA: Diagnosis not present

## 2016-06-03 NOTE — Therapy (Signed)
[place], [place], [address] Phone: [phone]   Fax:  [phone]  Physical Therapy Evaluation  Patient Details  Name: Cheryl [name] MRN: [id number] Date of Birth: 1951-02-07 Referring Provider: Dr. Sondra Come   Encounter Date: 06/03/2016      PT End of Session - 06/03/16 1732    Visit Number 1   Number of Visits 13   Date for PT Re-Evaluation 07/08/16   PT Start Time 1310   PT Stop Time 1345   PT Time Calculation (min) 35 min      Past Medical History:  Diagnosis Date  . Arthritis   . Breast cancer ([place]) 1999   right breast, Adriamycin Therapy  . Breast cancer of lower-outer quadrant of left female breast ([place]) 05/07/2016  . Cellulitis   . Chronic acquired lymphedema    rt arm, s/p right breast cancer  . GERD (gastroesophageal reflux disease)   . Headache   . 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 History of hiatal hernia   . History of kidney stones   . History of migraine   . Hyperlipidemia   . Kidney problem 1969   history of left kidney bleeding  . Osteopenia 02/2015   T score -2.0. Prior DEXA 2010 T score -1.7. FRAX 20%/2.1%. Based upon prior history of Fosamax treatment will hold on treatment now with repeat at 2 year interval for stability  . Strep throat    age 11  . Ventricular ectopy    chronic    Past Surgical History:  Procedure Laterality Date  . BREAST LUMPECTOMY WITH RADIOACTIVE SEED AND SENTINEL LYMPH NODE BIOPSY Left 05/07/2016   Procedure: LEFT BREAST LUMPECTOMY WITH RADIOACTIVE SEED AND LEFT AXILLARY SENTINEL LYMPH NODE BIOPSY;  Surgeon: Fanny Skates, MD;  Location: [facility];  Service: General;  Laterality: Left;  . BREAST SURGERY     Mastectomy with TRAM  . CYSTOGRAM    . FINGER SURGERY Right 01/1999   finger on the hand  . FOOT SURGERY  2004,2015  . FOOT SURGERY Left 07/2005   3 toes  . left thumb  1/06  . right mastectomy  10-27-97   with tram flap  .  right thumb  1/05  . TONSILLECTOMY AND ADENOIDECTOMY      There were no vitals filed for this visit.       Subjective Assessment - 06/03/16 1308    Subjective Pt states she is having no problems at all with the left arm---the one on the side she recently had  breast lumpectomy surgery.  She wants to do something about her right arm.  It is just heavy She is doing regular activity.  She is not longer doing the massages.  She needs a new compression sleeve but has not been able to afford it. She says she cannot get her sleeve on by herself    Pertinent History right mastectomy with ALND TRAM in 1996 with radiation She has had lymphedema in right arm 2005 and has been wearing a nighttime garment "most of the time"  Now with left breast cancer with lumpectomy with 2 sentinel nodes removed 05/07/2016  pt undergoing radition in a few weeks.    Patient Stated Goals to get arm a little smaller             [facility] PT Assessment - 06/03/16 0001      Assessment   Medical Diagnosis breast cancer    Referring Provider  Dr. Sondra Come    Onset Date/Surgical Date 10/19/97  approximate    Hand Dominance Right   Prior Therapy yes in 2005     Precautions   Precautions Other (comment)   Precaution Comments previous radiation      Restrictions   Weight Bearing Restrictions No     Balance Screen   Has the patient fallen in the past 6 months No   Has the patient had a decrease in activity level because of a fear of falling?  No   Is the patient reluctant to leave their home because of a fear of falling?  No     Home Social worker Private residence   Living Arrangements Parent  mom is in late 75's but is self sufficient   Available Help at Discharge Family     Prior Function   Level of Independence Independent   Vocation Retired   Leisure active with household activties, Firefighter, community service      Cognition   Overall Cognitive Status Within Functional Limits for tasks  assessed     Observation/Other Assessments   Observations pt comes in wearing a Bella Strong sleeve size large 20-30  that is showing obvious signs of wear She has a scoliosis with rib hump on right side.   Skin Integrity healing incisions in left axilla and under breast    Other Surveys  --  Lyphmedema LIfe Impact Scale 15 or 22% impaired      Observation/Other Assessments-Edema    Edema --  visible pitting edema in right arm      Sensation   Light Touch Not tested     Coordination   Gross Motor Movements are Fluid and Coordinated Yes     Posture/Postural Control   Posture/Postural Control Postural limitations   Posture Comments scoliosis      AROM   Overall AROM Comments right shoulder range of motion limited by malalignment of scapula from scoliosis    Right Shoulder Flexion 115 Degrees   Right Shoulder ABduction 135 Degrees   Left Shoulder Flexion 154 Degrees   Left Shoulder ABduction 155 Degrees     Strength   Overall Strength Within functional limits for tasks performed     Palpation   Palpation comment lymphostatic fibosis especially in right forearm with some in hand. pitting edema in these areas            LYMPHEDEMA/ONCOLOGY QUESTIONNAIRE - 06/03/16 1331      Surgeries   Mastectomy Date 10/19/97  right    Lumpectomy Date 04/29/16  left    Sentinel Lymph Node Biopsy Date 04/29/16  left    Axillary Lymph Node Dissection Date 10/19/97  right     Treatment   Past Chemotherapy Treatment Yes   Past Radiation Treatment Yes   Date 07/01/97  approximate    Body Site right upper quadrant      What other symptoms do you have   Are you Having Heaviness or Tightness Yes   Are you having Pain No   Are you having pitting edema Yes   Body Site right forearm    Is it Hard or Difficult finding clothes that fit Yes   Do you have infections Yes   Comments january 2018 right forearm    Is there Decreased scar mobility No   Stemmer Sign No   Other Symptoms  mild fullness in hand      Lymphedema Stage   Stage STAGE 2 SPONTANEOUSLY IRREVERSIBLE  Right Upper Extremity Lymphedema   15 cm Proximal to Olecranon Process 33 cm   10 cm Proximal to Olecranon Process 33 cm   Olecranon Process 30.2 cm   15 cm Proximal to Ulnar Styloid Process 32.5 cm   10 cm Proximal to Ulnar Styloid Process 28.5 cm   Just Proximal to Ulnar Styloid Process 17 cm   Across Hand at [employer] 18.5 cm   At [place] of 2nd Digit 6 cm     Left Upper Extremity Lymphedema   15 cm Proximal to Olecranon Process 29.5 cm   10 cm Proximal to Olecranon Process 28.5 cm   Olecranon Process 23.5 cm   15 cm Proximal to Ulnar Styloid Process 23 cm   10 cm Proximal to Ulnar Styloid Process 21 cm   Just Proximal to Ulnar Styloid Process 14 cm   Across Hand at [employer] 18 cm   At [place] of 2nd Digit 6 cm                                Long Term Clinic Goals - 06/03/16 1749      CC Long Term Goal  #1   Title Pt will have 2 cm ( to 15 cm )  reduction of circumference at right arm at ulnar styolid    Baseline 17 cm    Time 4   Period Weeks   Status New     CC Long Term Goal  #2   Title Pt will have a 4 cm reduction ( to 24.5 cm ) of right forearm at 10 cm proximal to ulnar styolid    Baseline 28.5   Time 4   Period Weeks   Status New     CC Long Term Goal  #3   Title Pt will know how to do self manual lymph drainage and compression bandaging with family assist so she can manage at home.    Time 4   Period Weeks   Status New     CC Long Term Goal  #4   Title pt will have knowledge of how to get assistance for compression garments and compression pumps    Time 4   Period Weeks   Status New            Plan - 06/03/16 1734    Clinical Impression Statement 66 yo female with longstanding lymphedema in right arm.   She needs replacement of daytime garments and says she not longer fits into the nighttime garment she brought in today.   She is interested in getting a compression pump  Her funds are limited and she has Medicare,  She will look into getting assitance from [place] and we will look into getting a pump from [facility].  She would benefit from complete decongestive therapy and remeasurement for garments. She has had a recent episode of cellulitis.  Because of these factors this is a moderate complexity eval    Rehab Potential Excellent   Clinical Impairments Affecting Rehab Potential previous ALND and radiation, scoliosis contributing to decreased shoulder movement    PT Frequency 3x / week   PT Duration 4 weeks   PT Treatment/Interventions ADLs/Self Care Home Management;Patient/family education;Orthotic Fit/Training;DME Instruction;Therapeutic exercise;Manual lymph drainage;Compression bandaging;Taping   PT Next Visit Plan Begin complete decongestive therapy with bandaging. later, Consider kinesiotape if needed.  Initiate [facility] for compression pump.  Reassess nighttime garment  and consider velcro extenders    Consulted and Agree with Plan of Care Patient      Patient will benefit from skilled therapeutic intervention in order to improve the following deficits and impairments:  Decreased knowledge of use of DME, Increased edema, Postural dysfunction  Visit Diagnosis: Postmastectomy lymphedema  Abnormal posture  Disorder of the skin and subcutaneous tissue related to radiation, unspecified      G-Codes - 06/08/2016 1747    Functional Assessment Tool Used Lymphedema life impact scale 22%   Functional Limitation Self care   Self Care Current Status (W8677) At least 20 percent but less than 40 percent impaired, limited or restricted   Self Care Goal Status (J7366) At least 1 percent but less than 20 percent impaired, limited or restricted       Problem List Patient Active Problem List   Diagnosis Date Noted  . Breast cancer of lower-outer quadrant of left female breast ([place]) 05/07/2016  . Genetic  testing 05/21/2015  . Family history of breast cancer   . Breast cancer ([place]) 03/17/2012  . 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 Hyperlipidemia 06/26/2011  . PVC's (premature ventricular contractions) 06/26/2011  . History of breast cancer 02/03/2011   Donato Heinz. Owens Shark PT   Norwood Levo 2016-06-08, 6:00 PM  [place], [place], [address] Phone: [phone]   Fax:  [phone]  Name: Cheryl [name] MRN: [id number] Date of Birth: 11/07/1950

## 2016-06-04 ENCOUNTER — Ambulatory Visit
Admission: RE | Admit: 2016-06-04 | Discharge: 2016-06-04 | Disposition: A | Discharge status: Home / Self Care | Payer: Medicare Other | Source: Ambulatory Visit | Attending: Radiation Oncology | Admitting: Radiation Oncology

## 2016-06-04 ENCOUNTER — Encounter: Payer: Self-pay | Admitting: Physical Therapy

## 2016-06-04 ENCOUNTER — Ambulatory Visit: Payer: Medicare Other | Admitting: Physical Therapy

## 2016-06-04 DIAGNOSIS — Z170 Estrogen receptor positive status [ER+]: Secondary | ICD-10-CM | POA: Diagnosis not present

## 2016-06-04 DIAGNOSIS — C50512 Malignant neoplasm of lower-outer quadrant of left female breast: Secondary | ICD-10-CM | POA: Diagnosis not present

## 2016-06-04 DIAGNOSIS — R293 Abnormal posture: Secondary | ICD-10-CM | POA: Diagnosis not present

## 2016-06-04 DIAGNOSIS — I972 Postmastectomy lymphedema syndrome: Secondary | ICD-10-CM

## 2016-06-04 DIAGNOSIS — Z510 Encounter for antineoplastic radiation therapy: Secondary | ICD-10-CM | POA: Diagnosis not present

## 2016-06-04 DIAGNOSIS — L599 Disorder of the skin and subcutaneous tissue related to radiation, unspecified: Secondary | ICD-10-CM | POA: Diagnosis not present

## 2016-06-04 NOTE — Therapy (Addendum)
[place], [place], [address] Phone: [phone]   Fax:  [phone]  Physical Therapy Treatment  Patient Details  Name: Cheryl [name] MRN: [id number] Date of Birth: 1950/05/07 Referring Provider: Dr. Sondra Come   Encounter Date: 06/04/2016      PT End of Session - 06/04/16 1655    Visit Number 2   Number of Visits 13   Date for PT Re-Evaluation 07/08/16   PT Start Time 9833   PT Stop Time 1650   PT Time Calculation (min) 45 min   Activity Tolerance Patient tolerated treatment well   Behavior During Therapy [facility] for tasks assessed/performed      Past Medical History:  Diagnosis Date  . Arthritis   . Breast cancer ([place]) 1999   right breast, Adriamycin Therapy  . Breast cancer of lower-outer quadrant of left female breast ([place]) 05/07/2016  . Cellulitis   . Chronic acquired lymphedema    rt arm, s/p right breast cancer  . GERD (gastroesophageal reflux disease)   . Headache   . 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 History of hiatal hernia   . History of kidney stones   . History of migraine   . Hyperlipidemia   . Kidney problem 1969   history of left kidney bleeding  . Osteopenia 02/2015   T score -2.0. Prior DEXA 2010 T score -1.7. FRAX 20%/2.1%. Based upon prior history of Fosamax treatment will hold on treatment now with repeat at 2 year interval for stability  . Strep throat    age 66  . Ventricular ectopy    chronic    Past Surgical History:  Procedure Laterality Date  . BREAST LUMPECTOMY WITH RADIOACTIVE SEED AND SENTINEL LYMPH NODE BIOPSY Left 05/07/2016   Procedure: LEFT BREAST LUMPECTOMY WITH RADIOACTIVE SEED AND LEFT AXILLARY SENTINEL LYMPH NODE BIOPSY;  Surgeon: Fanny Skates, MD;  Location: [facility];  Service: General;  Laterality: Left;  . BREAST SURGERY     Mastectomy with TRAM  . CYSTOGRAM    . FINGER SURGERY Right 01/1999   finger on the hand  . FOOT SURGERY  2004,2015   . FOOT SURGERY Left 07/2005   3 toes  . left thumb  1/06  . right mastectomy  10-27-97   with tram flap  . right thumb  1/05  . TONSILLECTOMY AND ADENOIDECTOMY      There were no vitals filed for this visit.      Subjective Assessment - 06/04/16 1607    Subjective (P)  My arm is the same today. I brought all my lymphedema supplies from 1999.    Pertinent History (P)  right mastectomy with ALND TRAM in 1996 with radiation She has had lymphedema in right arm 2005 and has been wearing a nighttime garment "most of the time"  Now with left breast cancer with lumpectomy with 2 sentinel nodes removed 05/07/2016  pt undergoing radition in a few weeks.    Patient Stated Goals (P)  to get arm a little smaller    Currently in Pain? (P)  No/denies   Pain Score (P)  0-No pain               LYMPHEDEMA/ONCOLOGY QUESTIONNAIRE - 06/03/16 1331      Surgeries   Mastectomy Date 10/19/97  right    Lumpectomy Date 04/29/16  left    Sentinel Lymph Node Biopsy Date 04/29/16  left    Axillary Lymph Node Dissection Date  10/19/97  right     Treatment   Past Chemotherapy Treatment Yes   Past Radiation Treatment Yes   Date 07/01/97  approximate    Body Site right upper quadrant      What other symptoms do you have   Are you Having Heaviness or Tightness Yes   Are you having Pain No   Are you having pitting edema Yes   Body Site right forearm    Is it Hard or Difficult finding clothes that fit Yes   Do you have infections Yes   Comments january 2018 right forearm    Is there Decreased scar mobility No   Stemmer Sign No   Other Symptoms mild fullness in hand      Lymphedema Stage   Stage STAGE 2 SPONTANEOUSLY IRREVERSIBLE     Right Upper Extremity Lymphedema   15 cm Proximal to Olecranon Process 33 cm   10 cm Proximal to Olecranon Process 33 cm   Olecranon Process 30.2 cm   15 cm Proximal to Ulnar Styloid Process 32.5 cm   10 cm Proximal to Ulnar Styloid Process 28.5 cm   Just  Proximal to Ulnar Styloid Process 17 cm   Across Hand at [employer] 18.5 cm   At [place] of 2nd Digit 6 cm     Left Upper Extremity Lymphedema   15 cm Proximal to Olecranon Process 29.5 cm   10 cm Proximal to Olecranon Process 28.5 cm   Olecranon Process 23.5 cm   15 cm Proximal to Ulnar Styloid Process 23 cm   10 cm Proximal to Ulnar Styloid Process 21 cm   Just Proximal to Ulnar Styloid Process 14 cm   Across Hand at [employer] 18 cm   At [place] of 2nd Digit 6 cm                  [facility] Adult PT Treatment/Exercise - 06/04/16 0001      Manual Therapy   Manual Therapy Manual Lymphatic Drainage (MLD);Compression Bandaging   Manual Lymphatic Drainage (MLD) short neck, superficial and deep abdominals, right inguinal nodes and establishment of axilloinguinal pathway, RUE working proximal to distal (axilla to fingers) directing fluid towards pathways, re establishment of pathways   Compression Bandaging biotone, thick stockinette from hand to axilla, elastomull to fingers 1-4, artiflex, 1 6cm, 1 8cm, 1 10 cm, 1 12 cm bandage from hand to axilla with 10 cm in herringbone pattern                        Long Term Clinic Goals - 06/03/16 1749      CC Long Term Goal  #1   Title Pt will have 2 cm ( to 15 cm )  reduction of circumference at right arm at ulnar styolid    Baseline 17 cm    Time 4   Period Weeks   Status New     CC Long Term Goal  #2   Title Pt will have a 4 cm reduction ( to 24.5 cm ) of right forearm at 10 cm proximal to ulnar styolid    Baseline 28.5   Time 4   Period Weeks   Status New     CC Long Term Goal  #3   Title Pt will know how to do self manual lymph drainage and compression bandaging with family assist so she can manage at home.    Time 4   Period  Weeks   Status New     CC Long Term Goal  #4   Title pt will have knowledge of how to get assistance for compression garments and compression pumps    Time 4   Period Weeks    Status New            Plan - 06/04/16 1655    Clinical Impression Statement Began MLD today to RUE. Pt demonstrates increased fibrosis of right forearm. Began compression bandaging today.    Rehab Potential Excellent   Clinical Impairments Affecting Rehab Potential previous ALND and radiation, scoliosis contributing to decreased shoulder movement    PT Frequency 3x / week   PT Duration 4 weeks   PT Treatment/Interventions ADLs/Self Care Home Management;Patient/family education;Orthotic Fit/Training;DME Instruction;Therapeutic exercise;Manual lymph drainage;Compression bandaging;Taping   PT Next Visit Plan Continue complete decongestive therapy with bandaging. later, Consider kinesiotape if needed.  Initiate [facility] for compression pump.  Reassess nighttime garment and consider velcro extenders    Consulted and Agree with Plan of Care Patient      Patient will benefit from skilled therapeutic intervention in order to improve the following deficits and impairments:  Decreased knowledge of use of DME, Increased edema, Postural dysfunction  Visit Diagnosis: Postmastectomy lymphedema       G-Codes - June 20, 2016 1747    Functional Assessment Tool Used Lymphedema life impact scale 22%   Functional Limitation Self care   Self Care Current Status (T7322) At least 20 percent but less than 40 percent impaired, limited or restricted   Self Care Goal Status (G2542) At least 1 percent but less than 20 percent impaired, limited or restricted      Problem List Patient Active Problem List   Diagnosis Date Noted  . Breast cancer of lower-outer quadrant of left female breast ([place]) 05/07/2016  . Genetic testing 05/21/2015  . Family history of breast cancer   . Breast cancer ([place]) 03/17/2012  . 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 Hyperlipidemia 06/26/2011  . PVC's (premature ventricular contractions) 06/26/2011  . History of breast cancer 02/03/2011    Allyson Sabal [facility] 06/04/2016, 5:05  PM  [place], [place], [address] Phone: [phone]   Fax:  [phone]  Name: Cheryl [name] MRN: [id number] Date of Birth: 1950/06/27  Manus Gunning, PT 06/04/16 5:05 PM

## 2016-06-07 NOTE — Progress Notes (Signed)
  Radiation Oncology         [phone] ________________________________  Name: Cheryl [name] MRN: [id number]  Date: 06/04/2016  DOB: October 29, 1950  SIMULATION AND TREATMENT PLANNING NOTE    ICD-9-CM ICD-10-CM   1. 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  NARRATIVE:  The patient was brought to the [facility].  Identity was confirmed.  All relevant records and images related to the planned course of therapy were reviewed.  The patient freely provided informed written consent to proceed with treatment after reviewing the details related to the planned course of therapy. The consent form was witnessed and verified by the simulation staff.  Then, the patient was set-up in a stable reproducible  supine position for radiation therapy.  CT images were obtained.  Surface markings were placed.  The CT images were loaded into the planning software.  Then the target and avoidance structures were contoured.  Treatment planning then occurred.  The radiation prescription was entered and confirmed.  Then, I designed and supervised the construction of a total of 3 medically necessary complex treatment devices.  I have requested : 3D Simulation  I have requested a DVH of the following structures: heart lungs lumpectomy cavity.  I have ordered:dose calc.  PLAN:  The patient will receive 50.4 Gy in 28 fractions followed by a boost to the lumpectomy cavity of 10 Gy.  -----------------------------------  Blair Promise, PhD, MD

## 2016-06-09 ENCOUNTER — Ambulatory Visit: Payer: Medicare Other

## 2016-06-09 DIAGNOSIS — I972 Postmastectomy lymphedema syndrome: Secondary | ICD-10-CM | POA: Diagnosis not present

## 2016-06-09 DIAGNOSIS — L599 Disorder of the skin and subcutaneous tissue related to radiation, unspecified: Secondary | ICD-10-CM | POA: Diagnosis not present

## 2016-06-09 DIAGNOSIS — R293 Abnormal posture: Secondary | ICD-10-CM | POA: Diagnosis not present

## 2016-06-09 NOTE — Therapy (Signed)
[place], [place], [address] Phone: [phone]   Fax:  [phone]  Physical Therapy Treatment  Patient Details  Name: Cheryl [name] MRN: [id number] Date of Birth: 10/08/1950 Referring Provider: Dr. Sondra Come   Encounter Date: 06/09/2016      PT End of Session - 06/09/16 0849    Visit Number 3   Number of Visits 13   Date for PT Re-Evaluation 07/08/16   PT Start Time 0801   PT Stop Time 0849   PT Time Calculation (min) 48 min   Activity Tolerance Patient tolerated treatment well   Behavior During Therapy [facility] for tasks assessed/performed      Past Medical History:  Diagnosis Date  . Arthritis   . Breast cancer ([place]) 1999   right breast, Adriamycin Therapy  . Breast cancer of lower-outer quadrant of left female breast ([place]) 05/07/2016  . Cellulitis   . Chronic acquired lymphedema    rt arm, s/p right breast cancer  . GERD (gastroesophageal reflux disease)   . Headache   . 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 History of hiatal hernia   . History of kidney stones   . History of migraine   . Hyperlipidemia   . Kidney problem 1969   history of left kidney bleeding  . Osteopenia 02/2015   T score -2.0. Prior DEXA 2010 T score -1.7. FRAX 20%/2.1%. Based upon prior history of Fosamax treatment will hold on treatment now with repeat at 2 year interval for stability  . Strep throat    age 66  . Ventricular ectopy    chronic    Past Surgical History:  Procedure Laterality Date  . BREAST LUMPECTOMY WITH RADIOACTIVE SEED AND SENTINEL LYMPH NODE BIOPSY Left 05/07/2016   Procedure: LEFT BREAST LUMPECTOMY WITH RADIOACTIVE SEED AND LEFT AXILLARY SENTINEL LYMPH NODE BIOPSY;  Surgeon: Fanny Skates, MD;  Location: [facility];  Service: General;  Laterality: Left;  . BREAST SURGERY     Mastectomy with TRAM  . CYSTOGRAM    . FINGER SURGERY Right 01/1999   finger on the hand  . FOOT SURGERY  2004,2015   . FOOT SURGERY Left 07/2005   3 toes  . left thumb  1/06  . right mastectomy  10-27-97   with tram flap  . right thumb  1/05  . TONSILLECTOMY AND ADENOIDECTOMY      There were no vitals filed for this visit.      Subjective Assessment - 06/09/16 0806    Subjective Bandages stayed on until Saturday and my arm looked better when they came off. I just put my sleeve on for the rest of the time.    Pertinent History right mastectomy with ALND TRAM in 1996 with radiation She has had lymphedema in right arm 2005 and has been wearing a nighttime garment "most of the time"  Now with left breast cancer with lumpectomy with 2 sentinel nodes removed 05/07/2016  pt undergoing radition in a few weeks.    Patient Stated Goals to get arm a little smaller    Currently in Pain? No/denies                         [facility] Adult PT Treatment/Exercise - 06/09/16 0001      Manual Therapy   Manual Therapy Manual Lymphatic Drainage (MLD);Compression Bandaging   Manual Lymphatic Drainage (MLD) short neck, superficial and deep abdominals, right inguinal nodes and  establishment of axilloinguinal pathway, RUE working proximal to distal (axilla to fingers) directing fluid towards pathways, re establishment of pathways   Compression Bandaging biotone, thick stockinette from hand to axilla, elastomull to fingers 1-4, artiflex, 1 6cm, 1 8cm, 1 10 cm, 1 12 cm bandage from hand to axilla                         Long Term Clinic Goals - 06/03/16 1749      CC Long Term Goal  #1   Title Pt will have 2 cm ( to 15 cm )  reduction of circumference at right arm at ulnar styolid    Baseline 17 cm    Time 4   Period Weeks   Status New     CC Long Term Goal  #2   Title Pt will have a 4 cm reduction ( to 24.5 cm ) of right forearm at 10 cm proximal to ulnar styolid    Baseline 28.5   Time 4   Period Weeks   Status New     CC Long Term Goal  #3   Title Pt will know how to do self manual  lymph drainage and compression bandaging with family assist so she can manage at home.    Time 4   Period Weeks   Status New     CC Long Term Goal  #4   Title pt will have knowledge of how to get assistance for compression garments and compression pumps    Time 4   Period Weeks   Status New            Plan - 06/09/16 0849    Clinical Impression Statement Pt tolerated bandaging very well so continued with same treatment today to furhter her circumferenetial reductions and reduce fibrosis overall decreasing her risk on infection in her Rt UE.   Rehab Potential Excellent   Clinical Impairments Affecting Rehab Potential previous ALND and radiation, scoliosis contributing to decreased shoulder movement    PT Frequency 3x / week   PT Duration 4 weeks   PT Treatment/Interventions ADLs/Self Care Home Management;Patient/family education;Orthotic Fit/Training;DME Instruction;Therapeutic exercise;Manual lymph drainage;Compression bandaging;Taping   PT Next Visit Plan Remeasure circumference next. Continue complete decongestive therapy with bandaging. later, Consider kinesiotape if needed.  Initiate [facility] for compression pump.  Reassess nighttime garment and consider velcro extenders    Consulted and Agree with Plan of Care Patient      Patient will benefit from skilled therapeutic intervention in order to improve the following deficits and impairments:  Decreased knowledge of use of DME, Increased edema, Postural dysfunction  Visit Diagnosis: Postmastectomy lymphedema  Disorder of the skin and subcutaneous tissue related to radiation, unspecified     Problem List Patient Active Problem List   Diagnosis Date Noted  . Breast cancer of lower-outer quadrant of left female breast ([place]) 05/07/2016  . Genetic testing 05/21/2015  . Family history of breast cancer   . Breast cancer ([place]) 03/17/2012  . 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 Hyperlipidemia 06/26/2011  . PVC's (premature  ventricular contractions) 06/26/2011  . History of breast cancer 02/03/2011    Cheryl [name], PTA 06/09/2016, 8:51 AM  [place], [place], [address] Phone: [phone]   Fax:  [phone]  Name: Cheryl [name] MRN: [id number] Date of Birth: [birth year]

## 2016-06-10 DIAGNOSIS — Z170 Estrogen receptor positive status [ER+]: Secondary | ICD-10-CM | POA: Diagnosis not present

## 2016-06-10 DIAGNOSIS — C50512 Malignant neoplasm of lower-outer quadrant of left female breast: Secondary | ICD-10-CM | POA: Diagnosis not present

## 2016-06-10 DIAGNOSIS — Z510 Encounter for antineoplastic radiation therapy: Secondary | ICD-10-CM | POA: Diagnosis not present

## 2016-06-11 ENCOUNTER — Other Ambulatory Visit: Payer: Self-pay | Admitting: *Deleted

## 2016-06-11 ENCOUNTER — Ambulatory Visit
Admission: RE | Admit: 2016-06-11 | Discharge: 2016-06-11 | Disposition: A | Discharge status: Home / Self Care | Payer: Medicare Other | Source: Ambulatory Visit | Attending: Radiation Oncology | Admitting: Radiation Oncology

## 2016-06-11 ENCOUNTER — Ambulatory Visit: Payer: Medicare Other

## 2016-06-11 DIAGNOSIS — C50512 Malignant neoplasm of lower-outer quadrant of left female breast: Secondary | ICD-10-CM

## 2016-06-11 DIAGNOSIS — Z510 Encounter for antineoplastic radiation therapy: Secondary | ICD-10-CM | POA: Diagnosis not present

## 2016-06-11 DIAGNOSIS — R293 Abnormal posture: Secondary | ICD-10-CM | POA: Diagnosis not present

## 2016-06-11 DIAGNOSIS — Z170 Estrogen receptor positive status [ER+]: Principal | ICD-10-CM

## 2016-06-11 DIAGNOSIS — L599 Disorder of the skin and subcutaneous tissue related to radiation, unspecified: Secondary | ICD-10-CM | POA: Diagnosis not present

## 2016-06-11 DIAGNOSIS — I972 Postmastectomy lymphedema syndrome: Secondary | ICD-10-CM

## 2016-06-11 MED ORDER — EZETIMIBE 10 MG PO TABS: 10.0000 mg | ORAL_TABLET | Freq: Every day | ORAL | 0 refills | Status: DC

## 2016-06-11 MED FILL — EZETIMIBE 10 MG TABLET: 10 | 15 days supply | Qty: 15 | Fill #0

## 2016-06-11 MED FILL — PROPRANOLOL ER 80 MG CAP: 80 | 90 days supply | Qty: 90 | Fill #3

## 2016-06-11 NOTE — Therapy (Signed)
[place], [place], [address] Phone: [phone]   Fax:  [phone]  Physical Therapy Treatment  Patient Details  Name: Cheryl [name] MRN: [id number] Date of Birth: [birth year] Referring Provider: Dr. Sondra Come   Encounter Date: 06/11/2016      PT End of Session - 06/11/16 1351    Visit Number 4   Number of Visits 13   Date for PT Re-Evaluation 07/08/16   PT Start Time 1300   PT Stop Time 1347   PT Time Calculation (min) 47 min   Activity Tolerance Patient tolerated treatment well   Behavior During Therapy [facility] for tasks assessed/performed      Past Medical History:  Diagnosis Date  . Arthritis   . Breast cancer ([place]) 1999   right breast, Adriamycin Therapy  . Breast cancer of lower-outer quadrant of left female breast ([place]) 05/07/2016  . Cellulitis   . Chronic acquired lymphedema    rt arm, s/p right breast cancer  . GERD (gastroesophageal reflux disease)   . Headache   . 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 History of hiatal hernia   . History of kidney stones   . History of migraine   . Hyperlipidemia   . Kidney problem 1969   history of left kidney bleeding  . Osteopenia 02/2015   T score -2.0. Prior DEXA 2010 T score -1.7. FRAX 20%/2.1%. Based upon prior history of Fosamax treatment will hold on treatment now with repeat at 2 year interval for stability  . Strep throat    age 66  . Ventricular ectopy    chronic    Past Surgical History:  Procedure Laterality Date  . BREAST LUMPECTOMY WITH RADIOACTIVE SEED AND SENTINEL LYMPH NODE BIOPSY Left 05/07/2016   Procedure: LEFT BREAST LUMPECTOMY WITH RADIOACTIVE SEED AND LEFT AXILLARY SENTINEL LYMPH NODE BIOPSY;  Surgeon: Fanny Skates, MD;  Location: [facility];  Service: General;  Laterality: Left;  . BREAST SURGERY     Mastectomy with TRAM  . CYSTOGRAM    . FINGER SURGERY Right 01/1999   finger on the hand  . FOOT SURGERY  2004,2015   . FOOT SURGERY Left 07/2005   3 toes  . left thumb  1/06  . right mastectomy  10-27-97   with tram flap  . right thumb  1/05  . TONSILLECTOMY AND ADENOIDECTOMY      There were no vitals filed for this visit.      Subjective Assessment - 06/11/16 1304    Subjective Bandages are going fine except the fingers don't stay on the best.    Pertinent History right mastectomy with ALND TRAM in 1996 with radiation She has had lymphedema in right arm 2005 and has been wearing a nighttime garment "most of the time"  Now with left breast cancer with lumpectomy with 2 sentinel nodes removed 05/07/2016  pt undergoing radition in a few weeks.    Patient Stated Goals to get arm a little smaller    Currently in Pain? No/denies               LYMPHEDEMA/ONCOLOGY QUESTIONNAIRE - 06/11/16 1308      Right Upper Extremity Lymphedema   15 cm Proximal to Olecranon Process 31.6 cm   10 cm Proximal to Olecranon Process 32.4 cm   Olecranon Process 30.5 cm   15 cm Proximal to Ulnar Styloid Process 30.3 cm   10 cm Proximal to Ulnar Styloid Process 26.4 cm  Just Proximal to Ulnar Styloid Process 17 cm   Across Hand at [employer] 18.6 cm   At [place] of 2nd Digit 6.6 cm                  [facility] Adult PT Treatment/Exercise - 06/11/16 0001      Manual Therapy   Manual Therapy Manual Lymphatic Drainage (MLD);Compression Bandaging   Manual Lymphatic Drainage (MLD) short neck, superficial and deep abdominals, right inguinal nodes and establishment of axilloinguinal pathway, RUE working proximal to distal (axilla to fingers) directing fluid towards pathways, re establishment of pathways   Compression Bandaging biotone, thick stockinette from hand to axilla, elastomull to fingers 1-4 with paper tape to prevent sliding, artiflex with rectangle piece of peach medi striped foam at medial forearm inferior to antecubital fossa, 1 6cm, 1 8cm, 1 10 cm, 1 12 cm bandage from hand to axilla  with 10 cm bandage  done in herring bone fashion                        [facility] Goals - 06/03/16 1749      CC Long Term Goal  #1   Title Pt will have 2 cm ( to 15 cm )  reduction of circumference at right arm at ulnar styolid    Baseline 17 cm    Time 4   Period Weeks   Status New     CC Long Term Goal  #2   Title Pt will have a 4 cm reduction ( to 24.5 cm ) of right forearm at 10 cm proximal to ulnar styolid    Baseline 28.5   Time 4   Period Weeks   Status New     CC Long Term Goal  #3   Title Pt will know how to do self manual lymph drainage and compression bandaging with family assist so she can manage at home.    Time 4   Period Weeks   Status New     CC Long Term Goal  #4   Title pt will have knowledge of how to get assistance for compression garments and compression pumps    Time 4   Period Weeks   Status New            Plan - 06/11/16 1353    Clinical Impression Statement Pt continues to tolerate bandaging well and had great reductions with circumferential measurements at forearm.    Rehab Potential Excellent   Clinical Impairments Affecting Rehab Potential previous ALND and radiation, scoliosis contributing to decreased shoulder movement    PT Frequency 3x / week   PT Duration 4 weeks   PT Treatment/Interventions ADLs/Self Care Home Management;Patient/family education;Orthotic Fit/Training;DME Instruction;Therapeutic exercise;Manual lymph drainage;Compression bandaging;Taping   PT Next Visit Plan Continue complete decongestive therapy with bandaging. later, Consider kinesiotape if needed.  Initiate [facility] for compression pump.  Reassess nighttime garment and consider velcro extenders    Recommended Other Services Sent demographics to [employer] to start insurance process to see what pts secondary will cover.   Consulted and Agree with Plan of Care Patient      Patient will benefit from skilled therapeutic intervention in order to improve the  following deficits and impairments:  Decreased knowledge of use of DME, Increased edema, Postural dysfunction  Visit Diagnosis: Postmastectomy lymphedema  Disorder of the skin and subcutaneous tissue related to radiation, unspecified     Problem List Patient Active  Problem List   Diagnosis Date Noted  . Breast cancer of lower-outer quadrant of left female breast ([place]) 05/07/2016  . Genetic testing 05/21/2015  . Family history of breast cancer   . Breast cancer ([place]) 03/17/2012  . 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 Hyperlipidemia 06/26/2011  . PVC's (premature ventricular contractions) 06/26/2011  . History of breast cancer 02/03/2011    [name], PTA 06/11/2016, 1:55 PM  [place], [place], [address] Phone: [phone]   Fax:  [phone]  Name: Cheryl [name] MRN: [id number] Date of Birth: Apr 21, 1951

## 2016-06-11 NOTE — Telephone Encounter (Signed)
ezetimibe (ZETIA) 10 MG tablet  Medication  Date: 05/06/2016 Department: [address] Office Ordering/Authorizing: Larey Dresser, MD  Order Providers   Prescribing Provider Encounter Provider  Larey Dresser, MD Juventino Slovak, CMA  Medication Detail    Disp Refills Start End   ezetimibe (ZETIA) 10 MG tablet 30 tablet 0 05/06/2016    Sig - Route: Take 1 tablet (10 mg total) by mouth daily. *Patient is overdue for an appointment. Please call and schedule for further refills* - Oral   E-Prescribing Status: Receipt confirmed by pharmacy (05/06/2016 9:28 AM EST)   Pharmacy   [place], [place].

## 2016-06-11 NOTE — Progress Notes (Signed)
  Radiation Oncology         [phone] ________________________________  Name: Cheryl [name] MRN: [id number]  Date: 06/11/2016  DOB: 02-16-1951  Simulation Verification Note    ICD-9-CM ICD-10-CM   1. Malignant neoplasm of lower-outer quadrant of left breast of female, estrogen receptor positive ([place]) 174.5 C50.512    V86.0 Z17.0     Status: outpatient  NARRATIVE: The patient was brought to the treatment unit and placed in the planned treatment position. The clinical setup was verified. Then port films were obtained and uploaded to the radiation oncology medical record software.  The treatment beams were carefully compared against the planned radiation fields. The position location and shape of the radiation fields was reviewed. They targeted volume of tissue appears to be appropriately covered by the radiation beams. Organs at risk appear to be excluded as planned.  Based on my personal review, I approved the simulation verification. The patient's treatment will proceed as planned.  -----------------------------------  Blair Promise, PhD, MD

## 2016-06-12 ENCOUNTER — Ambulatory Visit
Admission: RE | Admit: 2016-06-12 | Discharge: 2016-06-12 | Disposition: A | Discharge status: Home / Self Care | Payer: Medicare Other | Source: Ambulatory Visit | Attending: Radiation Oncology | Admitting: Radiation Oncology

## 2016-06-12 ENCOUNTER — Inpatient Hospital Stay
Admission: RE | Admit: 2016-06-12 | Discharge: 2016-06-12 | Disposition: A | Discharge status: Home / Self Care | Payer: Self-pay | Source: Ambulatory Visit | Attending: Radiation Oncology | Admitting: Radiation Oncology

## 2016-06-12 DIAGNOSIS — Z170 Estrogen receptor positive status [ER+]: Principal | ICD-10-CM

## 2016-06-12 DIAGNOSIS — Z510 Encounter for antineoplastic radiation therapy: Secondary | ICD-10-CM | POA: Diagnosis not present

## 2016-06-12 DIAGNOSIS — C50512 Malignant neoplasm of lower-outer quadrant of left female breast: Secondary | ICD-10-CM

## 2016-06-12 MED ORDER — RADIAPLEXRX EX GEL
Freq: Once | CUTANEOUS | Status: AC
  Administered 2016-06-12: 13:00:00 via TOPICAL

## 2016-06-12 MED ORDER — ALRA NON-METALLIC DEODORANT (RAD-ONC)
1.0000 "application " | Freq: Once | TOPICAL | Status: AC
  Administered 2016-06-12: 1 via TOPICAL

## 2016-06-12 NOTE — Progress Notes (Signed)
Pt here for patient teaching.  Pt given Radiation and You booklet, skin care instructions, Alra deodorant and Radiaplex gel.  Reviewed areas of pertinence such as fatigue, skin changes, breast tenderness and breast swelling . Pt able to give teach back of to pat skin and use unscented/gentle soap,apply Radiaplex bid and avoid applying anything to skin within 4 hours of treatment. Pt demonstrated understanding and verbalizes understanding of information given and will contact nursing with any questions or concerns.          

## 2016-06-13 ENCOUNTER — Ambulatory Visit: Payer: Medicare Other | Admitting: Physical Therapy

## 2016-06-13 ENCOUNTER — Ambulatory Visit
Admission: RE | Admit: 2016-06-13 | Discharge: 2016-06-13 | Disposition: A | Discharge status: Home / Self Care | Payer: Medicare Other | Source: Ambulatory Visit | Attending: Radiation Oncology | Admitting: Radiation Oncology

## 2016-06-13 DIAGNOSIS — Z510 Encounter for antineoplastic radiation therapy: Secondary | ICD-10-CM | POA: Diagnosis not present

## 2016-06-13 DIAGNOSIS — Z170 Estrogen receptor positive status [ER+]: Secondary | ICD-10-CM | POA: Diagnosis not present

## 2016-06-13 DIAGNOSIS — L599 Disorder of the skin and subcutaneous tissue related to radiation, unspecified: Secondary | ICD-10-CM

## 2016-06-13 DIAGNOSIS — R293 Abnormal posture: Secondary | ICD-10-CM

## 2016-06-13 DIAGNOSIS — I972 Postmastectomy lymphedema syndrome: Secondary | ICD-10-CM | POA: Diagnosis not present

## 2016-06-13 DIAGNOSIS — C50512 Malignant neoplasm of lower-outer quadrant of left female breast: Secondary | ICD-10-CM | POA: Diagnosis not present

## 2016-06-13 NOTE — Therapy (Signed)
[place], [place], [address] Phone: [phone]   Fax:  [phone]  Physical Therapy Treatment  Patient Details  Name: Cheryl [name] MRN: [id number] Date of Birth: 1951/04/12 Referring Provider: Dr. Sondra Come   Encounter Date: 06/13/2016      PT End of Session - 06/13/16 0903    Visit Number 5   Number of Visits 13   Date for PT Re-Evaluation 07/08/16   PT Start Time 0800   PT Stop Time 0855   PT Time Calculation (min) 55 min   Activity Tolerance Patient tolerated treatment well   Behavior During Therapy [facility] for tasks assessed/performed      Past Medical History:  Diagnosis Date  . Arthritis   . Breast cancer ([place]) 1999   right breast, Adriamycin Therapy  . Breast cancer of lower-outer quadrant of left female breast ([place]) 05/07/2016  . Cellulitis   . Chronic acquired lymphedema    rt arm, s/p right breast cancer  . GERD (gastroesophageal reflux disease)   . Headache   . 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 History of hiatal hernia   . History of kidney stones   . History of migraine   . Hyperlipidemia   . Kidney problem 1969   history of left kidney bleeding  . Osteopenia 02/2015   T score -2.0. Prior DEXA 2010 T score -1.7. FRAX 20%/2.1%. Based upon prior history of Fosamax treatment will hold on treatment now with repeat at 2 year interval for stability  . Strep throat    age 66  . Ventricular ectopy    chronic    Past Surgical History:  Procedure Laterality Date  . BREAST LUMPECTOMY WITH RADIOACTIVE SEED AND SENTINEL LYMPH NODE BIOPSY Left 05/07/2016   Procedure: LEFT BREAST LUMPECTOMY WITH RADIOACTIVE SEED AND LEFT AXILLARY SENTINEL LYMPH NODE BIOPSY;  Surgeon: Fanny Skates, MD;  Location: [facility];  Service: General;  Laterality: Left;  . BREAST SURGERY     Mastectomy with TRAM  . CYSTOGRAM    . FINGER SURGERY Right 01/1999   finger on the hand  . FOOT SURGERY  2004,2015   . FOOT SURGERY Left 07/2005   3 toes  . left thumb  1/06  . right mastectomy  10-27-97   with tram flap  . right thumb  1/05  . TONSILLECTOMY AND ADENOIDECTOMY      There were no vitals filed for this visit.      Subjective Assessment - 06/13/16 0808    Subjective Pt agrees to have demographics sent to [facility] for compression pump.  Wraps are going fine except that finger wraps keep coming off    Pertinent History right mastectomy with ALND TRAM in 1996 with radiation She has had lymphedema in right arm 2005 and has been wearing a nighttime garment "most of the time"  Now with left breast cancer with lumpectomy with 2 sentinel nodes removed 05/07/2016  pt undergoing radition in a few weeks.    Patient Stated Goals to get arm a little smaller    Currently in Pain? No/denies                         [facility] Adult PT Treatment/Exercise - 06/13/16 0001      Self-Care   Self-Care Other Self-Care Comments   Other Self-Care Comments  discussed [employer] with patient and she would like to proceed.  Sent demographics. Also talked  about need for flat knit garments ( custom vs off the shelf) and issued handout with phone numbers for DME supply.  Also showed medi donning butler so pt could don garment by herself without her mom's assist      Manual Therapy   Manual Therapy Manual Lymphatic Drainage (MLD);Compression Bandaging   Manual Lymphatic Drainage (MLD) short neck, superficial and deep abdominals, right inguinal nodes and establishment of axilloinguinal pathway, RUE working proximal to distal (axilla to fingers) directing fluid towards pathways, re establishment of pathways   Compression Bandaging biotone, thick stockinette from hand to axilla, elastomull to fingers 1-4 with paper tape to prevent sliding, artiflex with rectangle piece of peach medi striped foam at medial forearm inferior to antecubital fossa, 1 6cm, 1 8cm, 1 10 cm, 1 12 cm bandage from hand to axilla  with  10 cm bandage done in herring bone fashion                        Long Term Clinic Goals - 06/13/16 0915      CC Long Term Goal  #1   Title Pt will have 2 cm ( to 15 cm )  reduction of circumference at right arm at ulnar styolid    Baseline 17 cm at eval, 17 at 06/11/2016   Time 4   Period Weeks   Status On-going     CC Long Term Goal  #2   Title Pt will have a 4 cm reduction ( to 24.5 cm ) of right forearm at 10 cm proximal to ulnar styolid    Baseline 28.5 at eval, 26.4 on 06/11/2106   Time 4   Period Weeks   Status On-going     CC Long Term Goal  #3   Title Pt will know how to do self manual lymph drainage and compression bandaging with family assist so she can manage at home.    Time 4   Period Weeks   Status On-going     CC Long Term Goal  #4   Title pt will have knowledge of how to get assistance for compression garments and compression pumps    Time 4   Period Weeks   Status On-going            Plan - 06/13/16 0907    Clinical Impression Statement Pt continues to do well with compression bandaging.  She is preparing for home maintenance.  She will check into financial assist from [employer] for garment    Rehab Potential Excellent   Clinical Impairments Affecting Rehab Potential previous ALND and radiation, scoliosis contributing to decreased shoulder movement    PT Frequency 3x / week   PT Duration 4 weeks   PT Treatment/Interventions ADLs/Self Care Home Management;Patient/family education;Orthotic Fit/Training;DME Instruction;Therapeutic exercise;Manual lymph drainage;Compression bandaging;Taping   PT Next Visit Plan Continue complete decongestive therapy with bandaging. later, Consider kinesiotape if needed.  Initiate [facility] for compression pump.  Reassess nighttime garment and consider velcro extenders    Recommended Other Services demographics sent to [employer] for possible sleeve coverage and to [employer] for pump coverage    Consulted  and Agree with Plan of Care Patient      Patient will benefit from skilled therapeutic intervention in order to improve the following deficits and impairments:  Decreased knowledge of use of DME, Increased edema, Postural dysfunction  Visit Diagnosis: Postmastectomy lymphedema  Disorder of the skin and subcutaneous tissue related to radiation, unspecified  Abnormal posture     Problem List Patient Active Problem List   Diagnosis Date Noted  . Breast cancer of lower-outer quadrant of left female breast ([place]) 05/07/2016  . Genetic testing 05/21/2015  . Family history of breast cancer   . Breast cancer ([place]) 03/17/2012  . 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 Hyperlipidemia 06/26/2011  . PVC's (premature ventricular contractions) 06/26/2011  . History of breast cancer 02/03/2011   Donato Heinz. Owens Shark PT  Norwood Levo 06/13/2016, 9:19 AM  [place], [place], [address] Phone: [phone]   Fax:  [phone]  Name: Tallie Dodds Colson MRN: [id number] Date of Birth: 1950-11-13

## 2016-06-13 NOTE — Patient Instructions (Signed)
First of all, check with your insurance company to see if provider is in network   [facility]                                            [address]  [place], [place] [address] [phone]    Does not file for insurance--- call for appointment with Cathy  [facility]   (for wigs and compression sleeves / gloves/gauntlets )  [address] [place], [place] [address] [phone]  Will file some insurances --- call for appointment   [facility] (for mastectomy prosthetics and garments) [address] [place], [place] [address] [phone] Will file some insurances --- call for appointment  [facility]  [address]  , [place] [address] [phone] Lower extremity garments  [facility] [address] [place], [place]  [address] [phone]  [employer] Sales rep:  Matt Lawson:  [phone] [email or web address] Biocompression pumps   [employer]  Sales rep: Robert Rollins:  [phone] [email or web address] Entre and Flexitouch pumps    Other Resources: National Lymphedema Network:  www.lymphnet.org [email or web address] for patient articles and purchase a self manual lymph drainage DVD [email or web address] has informative articles.  

## 2016-06-16 ENCOUNTER — Ambulatory Visit: Payer: Medicare Other

## 2016-06-16 ENCOUNTER — Ambulatory Visit
Admission: RE | Admit: 2016-06-16 | Discharge: 2016-06-16 | Disposition: A | Discharge status: Home / Self Care | Payer: Medicare Other | Source: Ambulatory Visit | Attending: Radiation Oncology | Admitting: Radiation Oncology

## 2016-06-16 DIAGNOSIS — L599 Disorder of the skin and subcutaneous tissue related to radiation, unspecified: Secondary | ICD-10-CM | POA: Diagnosis not present

## 2016-06-16 DIAGNOSIS — I972 Postmastectomy lymphedema syndrome: Secondary | ICD-10-CM

## 2016-06-16 DIAGNOSIS — R293 Abnormal posture: Secondary | ICD-10-CM

## 2016-06-16 DIAGNOSIS — C50512 Malignant neoplasm of lower-outer quadrant of left female breast: Secondary | ICD-10-CM | POA: Diagnosis not present

## 2016-06-16 DIAGNOSIS — Z510 Encounter for antineoplastic radiation therapy: Secondary | ICD-10-CM | POA: Diagnosis not present

## 2016-06-16 DIAGNOSIS — Z170 Estrogen receptor positive status [ER+]: Secondary | ICD-10-CM | POA: Diagnosis not present

## 2016-06-16 NOTE — Therapy (Signed)
[place], [place], [address] Phone: [phone]   Fax:  [phone]  Physical Therapy Treatment  Patient Details  Name: Cheryl [name] MRN: [id number] Date of Birth: 11-15-1950 Referring Provider: Dr. Sondra Come   Encounter Date: 06/16/2016      PT End of Session - 06/16/16 0852    Visit Number 6   Number of Visits 13   Date for PT Re-Evaluation 07/08/16   PT Start Time 0801   PT Stop Time 0848   PT Time Calculation (min) 47 min   Activity Tolerance Patient tolerated treatment well   Behavior During Therapy [facility] for tasks assessed/performed      Past Medical History:  Diagnosis Date  . Arthritis   . Breast cancer ([place]) 1999   right breast, Adriamycin Therapy  . Breast cancer of lower-outer quadrant of left female breast ([place]) 05/07/2016  . Cellulitis   . Chronic acquired lymphedema    rt arm, s/p right breast cancer  . GERD (gastroesophageal reflux disease)   . Headache   . 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 History of hiatal hernia   . History of kidney stones   . History of migraine   . Hyperlipidemia   . Kidney problem 1969   history of left kidney bleeding  . Osteopenia 02/2015   T score -2.0. Prior DEXA 2010 T score -1.7. FRAX 20%/2.1%. Based upon prior history of Fosamax treatment will hold on treatment now with repeat at 2 year interval for stability  . Strep throat    age 66  . Ventricular ectopy    chronic    Past Surgical History:  Procedure Laterality Date  . BREAST LUMPECTOMY WITH RADIOACTIVE SEED AND SENTINEL LYMPH NODE BIOPSY Left 05/07/2016   Procedure: LEFT BREAST LUMPECTOMY WITH RADIOACTIVE SEED AND LEFT AXILLARY SENTINEL LYMPH NODE BIOPSY;  Surgeon: Fanny Skates, MD;  Location: [facility];  Service: General;  Laterality: Left;  . BREAST SURGERY     Mastectomy with TRAM  . CYSTOGRAM    . FINGER SURGERY Right 01/1999   finger on the hand  . FOOT SURGERY  2004,2015   . FOOT SURGERY Left 07/2005   3 toes  . left thumb  1/06  . right mastectomy  10-27-97   with tram flap  . right thumb  1/05  . TONSILLECTOMY AND ADENOIDECTOMY      There were no vitals filed for this visit.      Subjective Assessment - 06/16/16 0806    Subjective I tried to talk to the [place] representative but radiation was running late last time and I didn't get a chance. I took my bandage off this morning.    Pertinent History right mastectomy with ALND TRAM in 1996 with radiation She has had lymphedema in right arm 2005 and has been wearing a nighttime garment "most of the time"  Now with left breast cancer with lumpectomy with 2 sentinel nodes removed 05/07/2016  pt undergoing radition in a few weeks.    Patient Stated Goals to get arm a little smaller    Currently in Pain? No/denies                         [facility] Adult PT Treatment/Exercise - 06/16/16 0001      Manual Therapy   Manual Therapy Manual Lymphatic Drainage (MLD);Compression Bandaging   Manual Lymphatic Drainage (MLD) short neck, superficial and deep abdominals, right inguinal  nodes and establishment of axilloinguinal pathway, RUE working proximal to distal (axilla to fingers) directing fluid towards pathways, re establishment of pathways   Compression Bandaging biotone, thick stockinette from hand to axilla, elastomull to fingers 1-4 with paper tape to prevent sliding, artiflex with rectangle piece of peach medi striped foam at medial forearm inferior to antecubital fossa, 1 6cm, 1 8cm, 1 10 cm, 1 12 cm bandage from hand to axilla  with 10 cm bandage done in herring bone fashion                        [facility] Goals - 06/13/16 0915      CC Long Term Goal  #1   Title Pt will have 2 cm ( to 15 cm )  reduction of circumference at right arm at ulnar styolid    Baseline 17 cm at eval, 17 at 06/11/2016   Time 4   Period Weeks   Status On-going     CC Long Term Goal  #2   Title  Pt will have a 4 cm reduction ( to 24.5 cm ) of right forearm at 10 cm proximal to ulnar styolid    Baseline 28.5 at eval, 26.4 on 06/11/2106   Time 4   Period Weeks   Status On-going     CC Long Term Goal  #3   Title Pt will know how to do self manual lymph drainage and compression bandaging with family assist so she can manage at home.    Time 4   Period Weeks   Status On-going     CC Long Term Goal  #4   Title pt will have knowledge of how to get assistance for compression garments and compression pumps    Time 4   Period Weeks   Status On-going            Plan - 06/16/16 5643    Clinical Impression Statement Pt continues to tolerate bandaging well with no skin break downs and is compliant with leaving bandages on between sessions. She gets measured Wednesday for her new compression garments. Still plans to look into [employer].   Rehab Potential Excellent   Clinical Impairments Affecting Rehab Potential previous ALND and radiation, scoliosis contributing to decreased shoulder movement    PT Frequency 3x / week   PT Duration 4 weeks   PT Treatment/Interventions ADLs/Self Care Home Management;Patient/family education;Orthotic Fit/Training;DME Instruction;Therapeutic exercise;Manual lymph drainage;Compression bandaging;Taping   PT Next Visit Plan Remeasure circumference next. Continue complete decongestive therapy with bandaging. later, Consider kinesiotape if needed.  Initiate [facility] for compression pump.  Reassess nighttime garment and consider velcro extenders    Consulted and Agree with Plan of Care Patient      Patient will benefit from skilled therapeutic intervention in order to improve the following deficits and impairments:  Decreased knowledge of use of DME, Increased edema, Postural dysfunction  Visit Diagnosis: Postmastectomy lymphedema  Disorder of the skin and subcutaneous tissue related to radiation, unspecified  Abnormal posture     Problem  List Patient Active Problem List   Diagnosis Date Noted  . Breast cancer of lower-outer quadrant of left female breast ([place]) 05/07/2016  . Genetic testing 05/21/2015  . Family history of breast cancer   . Breast cancer ([place]) 03/17/2012  . 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 Hyperlipidemia 06/26/2011  . PVC's (premature ventricular contractions) 06/26/2011  . History of breast cancer 02/03/2011    [name],  PTA 06/16/2016, 8:55 AM  [place], [place], [address] Phone: [phone]   Fax:  [phone]  Name: Jodye Scali Carrender MRN: [id number] Date of Birth: [birth year]

## 2016-06-17 ENCOUNTER — Ambulatory Visit
Admission: RE | Admit: 2016-06-17 | Discharge: 2016-06-17 | Disposition: A | Discharge status: Home / Self Care | Payer: Medicare Other | Source: Ambulatory Visit | Attending: Radiation Oncology | Admitting: Radiation Oncology

## 2016-06-17 ENCOUNTER — Encounter: Payer: Self-pay | Admitting: Radiation Oncology

## 2016-06-17 VITALS — BP 137/70 | HR 66 | Temp 97.6°F | Resp 20 | Wt 138.8 lb

## 2016-06-17 DIAGNOSIS — Z170 Estrogen receptor positive status [ER+]: Principal | ICD-10-CM

## 2016-06-17 DIAGNOSIS — C50512 Malignant neoplasm of lower-outer quadrant of left female breast: Secondary | ICD-10-CM

## 2016-06-17 DIAGNOSIS — Z510 Encounter for antineoplastic radiation therapy: Secondary | ICD-10-CM | POA: Diagnosis not present

## 2016-06-17 NOTE — Progress Notes (Signed)
  Radiation Oncology         ([phone]) [phone] ________________________________  Name: Cheryl [name] MRN: [id number]  Date: 06/17/2016  DOB: 05-25-1950  Weekly Radiation Therapy Management    ICD-9-CM ICD-10-CM   1. Malignant neoplasm of lower-outer quadrant of left breast of female, estrogen receptor positive (HCC) 174.5 C50.512    V86.0 Z17.0      Current Dose: 7.2 Gy     Planned Dose:  60.4 Gy  Narrative . . . . . . . . The patient presents for routine under treatment assessment.                                    The patient completed 4/33 treatments to the left breast. She denies skin changes. He is using radiaplex BID. Her appetite is ok. She has right arm lymphedema and the arm is wrapped. She has mild fatigue.                                  Set-up films were reviewed.                                 The chart was checked. Physical Findings. . .  weight is 138 lb 12.8 oz (63 kg). Her oral temperature is 97.6 F (36.4 C). Her blood pressure is 137/70 and her pulse is 66. Her respiration is 20.  Lungs are clear to auscultation bilaterally. Heart has regular rate and rhythm. No significant skin reaction at this time. Impression . . . . . . . The patient is tolerating radiation. Plan . . . . . . . . . . . . Continue treatment as planned.  ________________________________   Blair Promise, PhD, MD  This document serves as a record of services personally performed by Gery Pray, MD. It was created on his behalf by Darcus Austin, a trained medical scribe. The creation of this record is based on the scribe's personal observations and the provider's statements to them. This document has been checked and approved by the attending provider.

## 2016-06-17 NOTE — Progress Notes (Signed)
Financial Counselor--Spoke with patient today--She is interested in receiving the [employer] and will need to bring in her income verification--she also had concerns about her medical bills--addressed these concerns and gave her may card, told her she could contact me with any additional questions

## 2016-06-17 NOTE — Progress Notes (Signed)
Weekly rad tx left breast 4/33 completed, no skin changes,  Using radiaplex bid, appetite okay, right arm warpped, has lymphedema in that arm,   Mild fatigue BP 137/70 (BP Location: Left Arm, Patient Position: Sitting, Cuff Size: Normal)   Pulse 66   Temp 97.6 F (36.4 C) (Oral)   Resp 20   Wt 138 lb 12.8 oz (63 kg)   BMI 27.11 kg/m   Wt Readings from Last 3 Encounters:  06/17/16 138 lb 12.8 oz (63 kg)  05/29/16 139 lb 3.2 oz (63.1 kg)  05/15/16 140 lb 9.6 oz (63.8 kg)

## 2016-06-18 ENCOUNTER — Encounter: Payer: Self-pay | Admitting: Physical Therapy

## 2016-06-18 ENCOUNTER — Ambulatory Visit: Payer: Medicare Other | Admitting: Physical Therapy

## 2016-06-18 ENCOUNTER — Ambulatory Visit
Admission: RE | Admit: 2016-06-18 | Discharge: 2016-06-18 | Disposition: A | Discharge status: Home / Self Care | Payer: Medicare Other | Source: Ambulatory Visit | Attending: Radiation Oncology | Admitting: Radiation Oncology

## 2016-06-18 DIAGNOSIS — C50512 Malignant neoplasm of lower-outer quadrant of left female breast: Secondary | ICD-10-CM | POA: Diagnosis not present

## 2016-06-18 DIAGNOSIS — I972 Postmastectomy lymphedema syndrome: Secondary | ICD-10-CM | POA: Diagnosis not present

## 2016-06-18 DIAGNOSIS — R293 Abnormal posture: Secondary | ICD-10-CM | POA: Diagnosis not present

## 2016-06-18 DIAGNOSIS — Z510 Encounter for antineoplastic radiation therapy: Secondary | ICD-10-CM | POA: Diagnosis not present

## 2016-06-18 DIAGNOSIS — L599 Disorder of the skin and subcutaneous tissue related to radiation, unspecified: Secondary | ICD-10-CM | POA: Diagnosis not present

## 2016-06-18 DIAGNOSIS — Z170 Estrogen receptor positive status [ER+]: Secondary | ICD-10-CM | POA: Diagnosis not present

## 2016-06-18 NOTE — Therapy (Signed)
[place], [place], [address] Phone: [phone]   Fax:  [phone]  Physical Therapy Treatment  Patient Details  Name: Cheryl [name] MRN: [id number] Date of Birth: Apr 17, 1951 Referring Provider: Dr. Sondra Come   Encounter Date: 06/18/2016      PT End of Session - 06/18/16 1345    Visit Number 7   Number of Visits 13   Date for PT Re-Evaluation 07/08/16   PT Start Time 1250   PT Stop Time 1345   PT Time Calculation (min) 55 min   Activity Tolerance Patient tolerated treatment well   Behavior During Therapy [facility] for tasks assessed/performed      Past Medical History:  Diagnosis Date  . Arthritis   . Breast cancer ([place]) 1999   right breast, Adriamycin Therapy  . Breast cancer of lower-outer quadrant of left female breast ([place]) 05/07/2016  . Cellulitis   . Chronic acquired lymphedema    rt arm, s/p right breast cancer  . GERD (gastroesophageal reflux disease)   . Headache   . 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 History of hiatal hernia   . History of kidney stones   . History of migraine   . Hyperlipidemia   . Kidney problem 1969   history of left kidney bleeding  . Osteopenia 02/2015   T score -2.0. Prior DEXA 2010 T score -1.7. FRAX 20%/2.1%. Based upon prior history of Fosamax treatment will hold on treatment now with repeat at 2 year interval for stability  . Strep throat    age 66  . Ventricular ectopy    chronic    Past Surgical History:  Procedure Laterality Date  . BREAST LUMPECTOMY WITH RADIOACTIVE SEED AND SENTINEL LYMPH NODE BIOPSY Left 05/07/2016   Procedure: LEFT BREAST LUMPECTOMY WITH RADIOACTIVE SEED AND LEFT AXILLARY SENTINEL LYMPH NODE BIOPSY;  Surgeon: Fanny Skates, MD;  Location: [facility];  Service: General;  Laterality: Left;  . BREAST SURGERY     Mastectomy with TRAM  . CYSTOGRAM    . FINGER SURGERY Right 01/1999   finger on the hand  . FOOT SURGERY  2004,2015   . FOOT SURGERY Left 07/2005   3 toes  . left thumb  1/06  . right mastectomy  10-27-97   with tram flap  . right thumb  1/05  . TONSILLECTOMY AND ADENOIDECTOMY      There were no vitals filed for this visit.      Subjective Assessment - 06/18/16 1254    Subjective Cheryl [name] is bringing my pump next Wednesday. I talked to [facility] on the phone and a flat knit compression sleeve will only cost me 124 dollars. When I get down all the way thats what I want.    Pertinent History right mastectomy with ALND TRAM in 1996 with radiation She has had lymphedema in right arm 2005 and has been wearing a nighttime garment "most of the time"  Now with left breast cancer with lumpectomy with 2 sentinel nodes removed 05/07/2016  pt undergoing radition in a few weeks.    Patient Stated Goals to get arm a little smaller    Currently in Pain? No/denies   Pain Score 0-No pain               LYMPHEDEMA/ONCOLOGY QUESTIONNAIRE - 06/18/16 1257      Right Upper Extremity Lymphedema   15 cm Proximal to Olecranon Process 31.7 cm   10 cm Proximal to Olecranon  Process 32 cm   Olecranon Process 30.6 cm   15 cm Proximal to Ulnar Styloid Process 29 cm   10 cm Proximal to Ulnar Styloid Process 25 cm   Just Proximal to Ulnar Styloid Process 17.8 cm   Across Hand at [employer] 18.1 cm   At [place] of 2nd Digit 6.5 cm                  [facility] Adult PT Treatment/Exercise - 06/18/16 0001      Manual Therapy   Manual Therapy Manual Lymphatic Drainage (MLD);Compression Bandaging   Manual Lymphatic Drainage (MLD) short neck, superficial and deep abdominals, right inguinal nodes and establishment of axilloinguinal pathway, RUE working proximal to distal (axilla to fingers) directing fluid towards pathways, re establishment of pathways   Compression Bandaging thick stockinette from hand to axilla, elastomull to fingers 1-4 with paper tape to prevent sliding, artiflex with rectangle piece of peach medi  striped foam at medial forearm inferior to antecubital fossa, 1 6cm, 1 8cm, 1 10 cm, 1 12 cm bandage from hand to axilla  with 10 cm bandage done in herring bone fashion                        [facility] Goals - 06/13/16 0915      CC Long Term Goal  #1   Title Pt will have 2 cm ( to 15 cm )  reduction of circumference at right arm at ulnar styolid    Baseline 17 cm at eval, 17 at 06/11/2016   Time 4   Period Weeks   Status On-going     CC Long Term Goal  #2   Title Pt will have a 4 cm reduction ( to 24.5 cm ) of right forearm at 10 cm proximal to ulnar styolid    Baseline 28.5 at eval, 26.4 on 06/11/2106   Time 4   Period Weeks   Status On-going     CC Long Term Goal  #3   Title Pt will know how to do self manual lymph drainage and compression bandaging with family assist so she can manage at home.    Time 4   Period Weeks   Status On-going     CC Long Term Goal  #4   Title pt will have knowledge of how to get assistance for compression garments and compression pumps    Time 4   Period Weeks   Status On-going            Plan - 06/18/16 1346    Clinical Impression Statement Pt demonstrates further reduction of RUE circumferential measurements. MLD continued to RUE followed by compression bandaging. Once pt reaches maximal reduction she will be measured for bandages.    Rehab Potential Excellent   Clinical Impairments Affecting Rehab Potential previous ALND and radiation, scoliosis contributing to decreased shoulder movement    PT Frequency 3x / week   PT Duration 4 weeks   PT Treatment/Interventions ADLs/Self Care Home Management;Patient/family education;Orthotic Fit/Training;DME Instruction;Therapeutic exercise;Manual lymph drainage;Compression bandaging;Taping   PT Next Visit Plan Remeasure circumference next- it measurements are no longer decreasing have pt measured for sleeve. Continue complete decongestive therapy and bandaging  Reassess nighttime  garment and consider velcro extenders    Consulted and Agree with Plan of Care Patient      Patient will benefit from skilled therapeutic intervention in order to improve the following deficits and impairments:  Decreased  knowledge of use of DME, Increased edema, Postural dysfunction  Visit Diagnosis: Postmastectomy lymphedema     Problem List Patient Active Problem List   Diagnosis Date Noted  . Breast cancer of lower-outer quadrant of left female breast ([place]) 05/07/2016  . Genetic testing 05/21/2015  . Family history of breast cancer   . Breast cancer ([place]) 03/17/2012  . 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 Hyperlipidemia 06/26/2011  . PVC's (premature ventricular contractions) 06/26/2011  . History of breast cancer 02/03/2011    Allyson Sabal [facility] 06/18/2016, 1:48 PM  [place], [place], [address] Phone: [phone]   Fax:  [phone]  Name: Cheryl [name] MRN: [id number] Date of Birth: [birth year]  Manus Gunning, PT 06/18/16 1:48 PM

## 2016-06-19 ENCOUNTER — Ambulatory Visit
Admission: RE | Admit: 2016-06-19 | Discharge: 2016-06-19 | Disposition: A | Discharge status: Home / Self Care | Payer: Medicare Other | Source: Ambulatory Visit | Attending: Radiation Oncology | Admitting: Radiation Oncology

## 2016-06-19 DIAGNOSIS — Z510 Encounter for antineoplastic radiation therapy: Secondary | ICD-10-CM | POA: Diagnosis not present

## 2016-06-19 DIAGNOSIS — C50512 Malignant neoplasm of lower-outer quadrant of left female breast: Secondary | ICD-10-CM | POA: Diagnosis not present

## 2016-06-19 DIAGNOSIS — Z170 Estrogen receptor positive status [ER+]: Secondary | ICD-10-CM | POA: Diagnosis not present

## 2016-06-20 ENCOUNTER — Encounter: Payer: Self-pay | Admitting: Physical Therapy

## 2016-06-20 ENCOUNTER — Ambulatory Visit
Admission: RE | Admit: 2016-06-20 | Discharge: 2016-06-20 | Disposition: A | Discharge status: Home / Self Care | Payer: Medicare Other | Source: Ambulatory Visit | Attending: Radiation Oncology | Admitting: Radiation Oncology

## 2016-06-20 ENCOUNTER — Ambulatory Visit: Payer: Medicare Other | Attending: Radiation Oncology | Admitting: Physical Therapy

## 2016-06-20 DIAGNOSIS — R293 Abnormal posture: Secondary | ICD-10-CM | POA: Insufficient documentation

## 2016-06-20 DIAGNOSIS — I972 Postmastectomy lymphedema syndrome: Secondary | ICD-10-CM | POA: Insufficient documentation

## 2016-06-20 DIAGNOSIS — L599 Disorder of the skin and subcutaneous tissue related to radiation, unspecified: Secondary | ICD-10-CM | POA: Insufficient documentation

## 2016-06-20 DIAGNOSIS — Z170 Estrogen receptor positive status [ER+]: Secondary | ICD-10-CM | POA: Diagnosis not present

## 2016-06-20 DIAGNOSIS — C50512 Malignant neoplasm of lower-outer quadrant of left female breast: Secondary | ICD-10-CM | POA: Diagnosis not present

## 2016-06-20 DIAGNOSIS — Z510 Encounter for antineoplastic radiation therapy: Secondary | ICD-10-CM | POA: Diagnosis not present

## 2016-06-20 NOTE — Therapy (Signed)
[place], [place], [address] Phone: [phone]   Fax:  [phone]  Physical Therapy Treatment  Patient Details  Name: Cheryl [name] MRN: [id number] Date of Birth: [birth year] Referring Provider: Dr. Sondra Come   Encounter Date: 06/20/2016      PT End of Session - 06/20/16 1159    Visit Number 8   Number of Visits 13   Date for PT Re-Evaluation 07/08/16   PT Start Time 1021   PT Stop Time 1103   PT Time Calculation (min) 42 min   Activity Tolerance Patient tolerated treatment well   Behavior During Therapy [facility] for tasks assessed/performed      Past Medical History:  Diagnosis Date  . Arthritis   . Breast cancer ([place]) 1999   right breast, Adriamycin Therapy  . Breast cancer of lower-outer quadrant of left female breast ([place]) 05/07/2016  . Cellulitis   . Chronic acquired lymphedema    rt arm, s/p right breast cancer  . GERD (gastroesophageal reflux disease)   . Headache   . 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 History of hiatal hernia   . History of kidney stones   . History of migraine   . Hyperlipidemia   . Kidney problem 1969   history of left kidney bleeding  . Osteopenia 02/2015   T score -2.0. Prior DEXA 2010 T score -1.7. FRAX 20%/2.1%. Based upon prior history of Fosamax treatment will hold on treatment now with repeat at 2 year interval for stability  . Strep throat    age 66  . Ventricular ectopy    chronic    Past Surgical History:  Procedure Laterality Date  . BREAST LUMPECTOMY WITH RADIOACTIVE SEED AND SENTINEL LYMPH NODE BIOPSY Left 05/07/2016   Procedure: LEFT BREAST LUMPECTOMY WITH RADIOACTIVE SEED AND LEFT AXILLARY SENTINEL LYMPH NODE BIOPSY;  Surgeon: Fanny Skates, MD;  Location: [facility];  Service: General;  Laterality: Left;  . BREAST SURGERY     Mastectomy with TRAM  . CYSTOGRAM    . FINGER SURGERY Right 01/1999   finger on the hand  . FOOT SURGERY  2004,2015   . FOOT SURGERY Left 07/2005   3 toes  . left thumb  1/06  . right mastectomy  10-27-97   with tram flap  . right thumb  1/05  . TONSILLECTOMY AND ADENOIDECTOMY      There were no vitals filed for this visit.      Subjective Assessment - 06/20/16 1023    Subjective My arm is about the same.    Pertinent History right mastectomy with ALND TRAM in 1996 with radiation She has had lymphedema in right arm 2005 and has been wearing a nighttime garment "most of the time"  Now with left breast cancer with lumpectomy with 2 sentinel nodes removed 05/07/2016  pt undergoing radition in a few weeks.    Patient Stated Goals to get arm a little smaller    Currently in Pain? No/denies   Pain Score 0-No pain                         [facility] Adult PT Treatment/Exercise - 06/20/16 0001      Manual Therapy   Manual Therapy Manual Lymphatic Drainage (MLD);Compression Bandaging   Manual Lymphatic Drainage (MLD) short neck, superficial and deep abdominals, right inguinal nodes and establishment of axilloinguinal pathway, RUE working proximal to distal (axilla to fingers) directing fluid  towards pathways, re establishment of pathways   Compression Bandaging thick stockinette from hand to axilla, elastomull to fingers 1-4 with paper tape to prevent sliding, artiflex with rectangle piece of peach medi striped foam at medial forearm inferior to antecubital fossa, 1 6cm, 1 8cm, 1 10 cm, 1 12 cm bandage from hand to axilla                          [facility] Term Clinic Goals - 06/13/16 0915      CC Long Term Goal  #1   Title Pt will have 2 cm ( to 15 cm )  reduction of circumference at right arm at ulnar styolid    Baseline 17 cm at eval, 17 at 06/11/2016   Time 4   Period Weeks   Status On-going     CC Long Term Goal  #2   Title Pt will have a 4 cm reduction ( to 24.5 cm ) of right forearm at 10 cm proximal to ulnar styolid    Baseline 28.5 at eval, 26.4 on 06/11/2106   Time 4    Period Weeks   Status On-going     CC Long Term Goal  #3   Title Pt will know how to do self manual lymph drainage and compression bandaging with family assist so she can manage at home.    Time 4   Period Weeks   Status On-going     CC Long Term Goal  #4   Title pt will have knowledge of how to get assistance for compression garments and compression pumps    Time 4   Period Weeks   Status On-going            Plan - 06/20/16 1158    Clinical Impression Statement Pt continues to do well with compression bandaging. She took her bandages off prior to her appointment today to take a shower. Once pt reaches maximal reduction she will be measured for a sleeve. Continued complete decongestive therapy today.    Rehab Potential Excellent   Clinical Impairments Affecting Rehab Potential previous ALND and radiation, scoliosis contributing to decreased shoulder movement    PT Frequency 3x / week   PT Duration 4 weeks   PT Treatment/Interventions ADLs/Self Care Home Management;Patient/family education;Orthotic Fit/Training;DME Instruction;Therapeutic exercise;Manual lymph drainage;Compression bandaging;Taping   PT Next Visit Plan Remeasure circumference next- it measurements are no longer decreasing have pt measured for sleeve. Continue complete decongestive therapy and bandaging  Reassess nighttime garment and consider velcro extenders    Consulted and Agree with Plan of Care Patient      Patient will benefit from skilled therapeutic intervention in order to improve the following deficits and impairments:  Decreased knowledge of use of DME, Increased edema, Postural dysfunction  Visit Diagnosis: Postmastectomy lymphedema     Problem List Patient Active Problem List   Diagnosis Date Noted  . Breast cancer of lower-outer quadrant of left female breast ([place]) 05/07/2016  . Genetic testing 05/21/2015  . Family history of breast cancer   . Breast cancer ([place]) 03/17/2012  . BRCA2  positive 03/17/2012  . Hyperlipidemia 06/26/2011  . PVC's (premature ventricular contractions) 06/26/2011  . History of breast cancer 02/03/2011    Allyson Sabal [facility] 06/20/2016, 12:00 PM  [place], [place], [address] Phone: [phone]   Fax:  [phone]  Name: Tricha Ruggirello Berrey MRN: [id number] Date of Birth: June 04, 1950  Allyson Sabal [place], [place] 06/20/16 12:01 PM

## 2016-06-23 ENCOUNTER — Ambulatory Visit
Admission: RE | Admit: 2016-06-23 | Discharge: 2016-06-23 | Disposition: A | Discharge status: Home / Self Care | Payer: Medicare Other | Source: Ambulatory Visit | Attending: Radiation Oncology | Admitting: Radiation Oncology

## 2016-06-23 ENCOUNTER — Ambulatory Visit: Payer: Medicare Other

## 2016-06-23 DIAGNOSIS — I972 Postmastectomy lymphedema syndrome: Secondary | ICD-10-CM

## 2016-06-23 DIAGNOSIS — Z510 Encounter for antineoplastic radiation therapy: Secondary | ICD-10-CM | POA: Diagnosis not present

## 2016-06-23 DIAGNOSIS — L599 Disorder of the skin and subcutaneous tissue related to radiation, unspecified: Secondary | ICD-10-CM | POA: Diagnosis not present

## 2016-06-23 DIAGNOSIS — R293 Abnormal posture: Secondary | ICD-10-CM | POA: Diagnosis not present

## 2016-06-23 DIAGNOSIS — C50512 Malignant neoplasm of lower-outer quadrant of left female breast: Secondary | ICD-10-CM | POA: Diagnosis not present

## 2016-06-23 DIAGNOSIS — Z170 Estrogen receptor positive status [ER+]: Secondary | ICD-10-CM | POA: Diagnosis not present

## 2016-06-23 NOTE — Therapy (Signed)
[place], [place], [address] Phone: [phone]   Fax:  [phone]  Physical Therapy Treatment  Patient Details  Name: Cheryl [name] MRN: [id number] Date of Birth: 12-29-1950 Referring Provider: Dr. Sondra Come   Encounter Date: 06/23/2016      PT End of Session - 06/23/16 1047    Visit Number 9   Number of Visits 13   Date for PT Re-Evaluation 07/08/16   PT Start Time 0940   PT Stop Time 1035   PT Time Calculation (min) 55 min   Activity Tolerance Patient tolerated treatment well   Behavior During Therapy [facility] for tasks assessed/performed      Past Medical History:  Diagnosis Date  . Arthritis   . Breast cancer ([place]) 1999   right breast, Adriamycin Therapy  . Breast cancer of lower-outer quadrant of left female breast ([place]) 05/07/2016  . Cellulitis   . Chronic acquired lymphedema    rt arm, s/p right breast cancer  . GERD (gastroesophageal reflux disease)   . Headache   . 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 History of hiatal hernia   . History of kidney stones   . History of migraine   . Hyperlipidemia   . Kidney problem 1969   history of left kidney bleeding  . Osteopenia 02/2015   T score -2.0. Prior DEXA 2010 T score -1.7. FRAX 20%/2.1%. Based upon prior history of Fosamax treatment will hold on treatment now with repeat at 2 year interval for stability  . Strep throat    age 66  . Ventricular ectopy    chronic    Past Surgical History:  Procedure Laterality Date  . BREAST LUMPECTOMY WITH RADIOACTIVE SEED AND SENTINEL LYMPH NODE BIOPSY Left 05/07/2016   Procedure: LEFT BREAST LUMPECTOMY WITH RADIOACTIVE SEED AND LEFT AXILLARY SENTINEL LYMPH NODE BIOPSY;  Surgeon: Fanny Skates, MD;  Location: [facility];  Service: General;  Laterality: Left;  . BREAST SURGERY     Mastectomy with TRAM  . CYSTOGRAM    . FINGER SURGERY Right 01/1999   finger on the hand  . FOOT SURGERY  2004,2015   . FOOT SURGERY Left 07/2005   3 toes  . left thumb  1/06  . right mastectomy  10-27-97   with tram flap  . right thumb  1/05  . TONSILLECTOMY AND ADENOIDECTOMY      There were no vitals filed for this visit.      Subjective Assessment - 06/23/16 0943    Subjective I was frustrated when she told me my arm had gone up some so I left the bandage on all weekend like she asked. My pump is supposed to arrive Wednesday.   Pertinent History right mastectomy with ALND TRAM in 1996 with radiation She has had lymphedema in right arm 2005 and has been wearing a nighttime garment "most of the time"  Now with left breast cancer with lumpectomy with 2 sentinel nodes removed 05/07/2016  pt undergoing radition in a few weeks.    Patient Stated Goals to get arm a little smaller    Currently in Pain? No/denies               LYMPHEDEMA/ONCOLOGY QUESTIONNAIRE - 06/23/16 0945      Right Upper Extremity Lymphedema   15 cm Proximal to Olecranon Process 31.3 cm   10 cm Proximal to Olecranon Process 31.4 cm   Olecranon Process 29 cm   15 cm Proximal  to Ulnar Styloid Process 29.3 cm   10 cm Proximal to Ulnar Styloid Process 25.5 cm   Just Proximal to Ulnar Styloid Process 17.8 cm   Across Hand at [employer] 18.2 cm   At [place] of 2nd Digit 6.5 cm                  [facility] Adult PT Treatment/Exercise - 06/23/16 0001      Manual Therapy   Manual Therapy Manual Lymphatic Drainage (MLD);Compression Bandaging   Manual Lymphatic Drainage (MLD) short neck, superficial and deep abdominals, right inguinal nodes and establishment of axilloinguinal pathway, RUE working proximal to distal (axilla to fingers) directing fluid towards pathways, re establishment of pathways   Compression Bandaging Biotone applied to Rt UE, thick stockinette from hand to axilla, elastomull to fingers 1-4, artiflex x1 with rectangle piece of peach medi striped foam at medial forearm inferior to antecubital fossa, 1-6cm,  1-8cm (doing this in herring bone from wrist to elbow), 1-10 cm, 1-12 cm short stretch compression bandage from hand to axilla                         Long Term Clinic Goals - 06/13/16 0915      CC Long Term Goal  #1   Title Pt will have 2 cm ( to 15 cm )  reduction of circumference at right arm at ulnar styolid    Baseline 17 cm at eval, 17 at 06/11/2016   Time 4   Period Weeks   Status On-going     CC Long Term Goal  #2   Title Pt will have a 4 cm reduction ( to 24.5 cm ) of right forearm at 10 cm proximal to ulnar styolid    Baseline 28.5 at eval, 26.4 on 06/11/2106   Time 4   Period Weeks   Status On-going     CC Long Term Goal  #3   Title Pt will know how to do self manual lymph drainage and compression bandaging with family assist so she can manage at home.    Time 4   Period Weeks   Status On-going     CC Long Term Goal  #4   Title pt will have knowledge of how to get assistance for compression garments and compression pumps    Time 4   Period Weeks   Status On-going            Plan - 06/23/16 1047    Clinical Impression Statement Remeasured pts circumference today and overall she had reductions or unchanged except some increase at forearm. She left her bandage on all weekend and this had slid down some settling in elbow more than likely causing the increase found at her forearm today. Changed bandages slightly today to in clude increased compression at her forearm to help her gain further reductions here by applying 8 cm bandage in herring bone pattern fro mwrist to elbow. Pt reported this felt comfortable after all bandaging complete. She plans to go to [facility] to be measured for compression sleeve once circumference of Rt UE has reached maximal reductions and have them bill [facility]. She is to receive her compression pump on Wednesday Pt continues to tolerate bandaging well and is overall progressing well towards her goals.     Rehab Potential Excellent   Clinical Impairments Affecting Rehab Potential previous ALND and radiation, scoliosis contributing to decreased shoulder movement  PT Frequency 3x / week   PT Duration 4 weeks   PT Treatment/Interventions ADLs/Self Care Home Management;Patient/family education;Orthotic Fit/Training;DME Instruction;Therapeutic exercise;Manual lymph drainage;Compression bandaging;Taping   PT Next Visit Plan Remeasure circumference next at forearm to see if different bandaging technique furthered reductions at forearm- once measurements are no longer decreasing have pt measured for sleeve/glove at [facility]. Continue complete decongestive therapy and bandaging  Reassess nighttime garment and consider velcro extenders    Consulted and Agree with Plan of Care Patient      Patient will benefit from skilled therapeutic intervention in order to improve the following deficits and impairments:  Decreased knowledge of use of DME, Increased edema, Postural dysfunction  Visit Diagnosis: Postmastectomy lymphedema     Problem List Patient Active Problem List   Diagnosis Date Noted  . Breast cancer of lower-outer quadrant of left female breast ([place]) 05/07/2016  . Genetic testing 05/21/2015  . Family history of breast cancer   . Breast cancer ([place]) 03/17/2012  . 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 Hyperlipidemia 06/26/2011  . PVC's (premature ventricular contractions) 06/26/2011  . History of breast cancer 02/03/2011    [name], PTA 06/23/2016, 10:58 AM  [place], [place], [address] Phone: [phone]   Fax:  [phone]  Name: Cheryl [name] MRN: [id number] Date of Birth: [birth year]

## 2016-06-24 ENCOUNTER — Ambulatory Visit
Admission: RE | Admit: 2016-06-24 | Discharge: 2016-06-24 | Disposition: A | Discharge status: Home / Self Care | Payer: Medicare Other | Source: Ambulatory Visit | Attending: Radiation Oncology | Admitting: Radiation Oncology

## 2016-06-24 ENCOUNTER — Encounter: Payer: Self-pay | Admitting: Radiation Oncology

## 2016-06-24 VITALS — BP 119/71 | HR 60 | Temp 98.5°F | Ht 60.0 in | Wt 138.2 lb

## 2016-06-24 DIAGNOSIS — C50512 Malignant neoplasm of lower-outer quadrant of left female breast: Secondary | ICD-10-CM

## 2016-06-24 DIAGNOSIS — Z170 Estrogen receptor positive status [ER+]: Principal | ICD-10-CM

## 2016-06-24 DIAGNOSIS — Z510 Encounter for antineoplastic radiation therapy: Secondary | ICD-10-CM | POA: Diagnosis not present

## 2016-06-24 NOTE — Progress Notes (Signed)
  Radiation Oncology         [phone] ________________________________  Name: Cheryl [name] MRN: [id number]  Date: 06/24/2016  DOB: 07/16/1950  Weekly Radiation Therapy Management    ICD-9-CM ICD-10-CM   1. Malignant neoplasm of lower-outer quadrant of left breast of female, estrogen receptor positive ([place]) 174.5 C50.512    V86.0 Z17.0      Current Dose: 16.2 Gy     Planned Dose:  60.4 Gy  Narrative . . . . . . . . The patient presents for routine under treatment assessment.                                    Cheryl [name] has completed 9 fractions to her left breast.  She denies having pain or new fatigue.  She is using radiaplex bid.  The skin on her left breast is pink. The patient has lymphedema of the right arm from a prior axillary dissection of the right side. She describes is is stable.                                  Set-up films were reviewed.                                 The chart was checked. Physical Findings. . .  height is 5' (1.524 m) and weight is 138 lb 3.2 oz (62.7 kg). Her oral temperature is 98.5 F (36.9 C). Her blood pressure is 119/71 and her pulse is 60. Her oxygen saturation is 100%.  Lungs are clear to auscultation bilaterally. Heart has regular rate and rhythm. Right arm and fingers tightly bound With lymphedema wrap. Mild erythema of the left breast. Impression . . . . . . . The patient is tolerating radiation. Plan . . . . . . . . . . . . Continue treatment as planned. ________________________________   Blair Promise, PhD, MD  This document serves as a record of services personally performed by Gery Pray, MD. It was created on his behalf by Darcus Austin, a trained medical scribe. The creation of this record is based on the scribe's personal observations and the provider's statements to them. This document has been checked and approved by the attending provider.

## 2016-06-24 NOTE — Progress Notes (Signed)
Cheryl [name] has completed 9 fractions to her left breast.  She denies having pain or new fatigue.  She is using radiaplex bid.  The skin on her left breast is pink.  BP 119/71 (BP Location: Left Arm, Patient Position: Sitting)   Pulse 60   Temp 98.5 F (36.9 C) (Oral)   Ht 5' (1.524 m)   Wt 138 lb 3.2 oz (62.7 kg)   SpO2 100%   BMI 26.99 kg/m    Wt Readings from Last 3 Encounters:  06/24/16 138 lb 3.2 oz (62.7 kg)  06/17/16 138 lb 12.8 oz (63 kg)  05/29/16 139 lb 3.2 oz (63.1 kg)

## 2016-06-25 ENCOUNTER — Ambulatory Visit
Admission: RE | Admit: 2016-06-25 | Discharge: 2016-06-25 | Disposition: A | Discharge status: Home / Self Care | Payer: Medicare Other | Source: Ambulatory Visit | Attending: Radiation Oncology | Admitting: Radiation Oncology

## 2016-06-25 ENCOUNTER — Encounter: Payer: Self-pay | Admitting: Physical Therapy

## 2016-06-25 ENCOUNTER — Ambulatory Visit: Payer: Medicare Other | Admitting: Physical Therapy

## 2016-06-25 DIAGNOSIS — Z510 Encounter for antineoplastic radiation therapy: Secondary | ICD-10-CM | POA: Diagnosis not present

## 2016-06-25 DIAGNOSIS — C50512 Malignant neoplasm of lower-outer quadrant of left female breast: Secondary | ICD-10-CM | POA: Diagnosis not present

## 2016-06-25 DIAGNOSIS — Z170 Estrogen receptor positive status [ER+]: Secondary | ICD-10-CM | POA: Diagnosis not present

## 2016-06-25 DIAGNOSIS — I972 Postmastectomy lymphedema syndrome: Secondary | ICD-10-CM | POA: Diagnosis not present

## 2016-06-25 DIAGNOSIS — L599 Disorder of the skin and subcutaneous tissue related to radiation, unspecified: Secondary | ICD-10-CM | POA: Diagnosis not present

## 2016-06-25 DIAGNOSIS — R293 Abnormal posture: Secondary | ICD-10-CM | POA: Diagnosis not present

## 2016-06-25 NOTE — Therapy (Signed)
[place], [place], [address] Phone: [phone]   Fax:  [phone]  Physical Therapy Treatment  Patient Details  Name: Cheryl [name] MRN: [id number] Date of Birth: 1950/10/04 Referring Provider: Dr. Sondra Come   Encounter Date: 06/25/2016      PT End of Session - 06/25/16 1529    Visit Number 10   Number of Visits 13   Date for PT Re-Evaluation 07/08/16   PT Start Time 5427   PT Stop Time 1433   PT Time Calculation (min) 44 min   Activity Tolerance Patient tolerated treatment well   Behavior During Therapy [facility] for tasks assessed/performed      Past Medical History:  Diagnosis Date  . Arthritis   . Breast cancer ([place]) 1999   right breast, Adriamycin Therapy  . Breast cancer of lower-outer quadrant of left female breast ([place]) 05/07/2016  . Cellulitis   . Chronic acquired lymphedema    rt arm, s/p right breast cancer  . GERD (gastroesophageal reflux disease)   . Headache   . 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 History of hiatal hernia   . History of kidney stones   . History of migraine   . Hyperlipidemia   . Kidney problem 1969   history of left kidney bleeding  . Osteopenia 02/2015   T score -2.0. Prior DEXA 2010 T score -1.7. FRAX 20%/2.1%. Based upon prior history of Fosamax treatment will hold on treatment now with repeat at 2 year interval for stability  . Strep throat    age 66  . Ventricular ectopy    chronic    Past Surgical History:  Procedure Laterality Date  . BREAST LUMPECTOMY WITH RADIOACTIVE SEED AND SENTINEL LYMPH NODE BIOPSY Left 05/07/2016   Procedure: LEFT BREAST LUMPECTOMY WITH RADIOACTIVE SEED AND LEFT AXILLARY SENTINEL LYMPH NODE BIOPSY;  Surgeon: Fanny Skates, MD;  Location: [facility];  Service: General;  Laterality: Left;  . BREAST SURGERY     Mastectomy with TRAM  . CYSTOGRAM    . FINGER SURGERY Right 01/1999   finger on the hand  . FOOT SURGERY  2004,2015   . FOOT SURGERY Left 07/2005   3 toes  . left thumb  1/06  . right mastectomy  10-27-97   with tram flap  . right thumb  1/05  . TONSILLECTOMY AND ADENOIDECTOMY      There were no vitals filed for this visit.      Subjective Assessment - 06/25/16 1351    Subjective The herringbone pattern felt better at my mid arm. I am not sure if it was because it was more snug.   Pertinent History right mastectomy with ALND TRAM in 1996 with radiation She has had lymphedema in right arm 2005 and has been wearing a nighttime garment "most of the time"  Now with left breast cancer with lumpectomy with 2 sentinel nodes removed 05/07/2016  pt undergoing radition in a few weeks.    Patient Stated Goals to get arm a little smaller    Currently in Pain? No/denies   Pain Score 0-No pain               LYMPHEDEMA/ONCOLOGY QUESTIONNAIRE - 06/25/16 1352      Right Upper Extremity Lymphedema   15 cm Proximal to Olecranon Process 31.6 cm   10 cm Proximal to Olecranon Process 32 cm   Olecranon Process 29.5 cm   15 cm Proximal to Ulnar Styloid Process  28.5 cm   10 cm Proximal to Ulnar Styloid Process 24 cm   Just Proximal to Ulnar Styloid Process 17 cm   Across Hand at [employer] 18 cm   At [place] of 2nd Digit 6.7 cm                  [facility] Adult PT Treatment/Exercise - 06/25/16 0001      Manual Therapy   Manual Therapy Manual Lymphatic Drainage (MLD);Compression Bandaging;Edema management   Edema Management instructed pt in how to use new compression pump for management of RUE swelling   Manual Lymphatic Drainage (MLD) short neck, superficial and deep abdominals, right inguinal nodes and establishment of axilloinguinal pathway, RUE working proximal to distal (axilla to fingers) directing fluid towards pathways, re establishment of pathways   Compression Bandaging Biotone applied to Rt UE, thick stockinette from hand to axilla, elastomull to fingers 1-4, artiflex x1 with rectangle piece  of peach medi striped foam at medial forearm inferior to antecubital fossa, 1-6cm, 1-8cm (doing this in herring bone from wrist to elbow), 1-10 cm, 1-12 cm short stretch compression bandage from hand to axilla                 PT Education - 06/25/16 1528    Education provided Yes   Education Details how to use compression pump   Person(s) Educated Patient   Methods Explanation;Demonstration   Comprehension Verbalized understanding                Long Term Clinic Goals - 06/13/16 0915      CC Long Term Goal  #1   Title Pt will have 2 cm ( to 15 cm )  reduction of circumference at right arm at ulnar styolid    Baseline 17 cm at eval, 17 at 06/11/2016   Time 4   Period Weeks   Status On-going     CC Long Term Goal  #2   Title Pt will have a 4 cm reduction ( to 24.5 cm ) of right forearm at 10 cm proximal to ulnar styolid    Baseline 28.5 at eval, 26.4 on 06/11/2106   Time 4   Period Weeks   Status On-going     CC Long Term Goal  #3   Title Pt will know how to do self manual lymph drainage and compression bandaging with family assist so she can manage at home.    Time 4   Period Weeks   Status On-going     CC Long Term Goal  #4   Title pt will have knowledge of how to get assistance for compression garments and compression pumps    Time 4   Period Weeks   Status On-going            Plan - 06/25/16 1529    Clinical Impression Statement Pt demosntrates further decrease of lymphedema at right forearm. She received a compression pump from DME supplier and spent time with patient assessing arm sleeve for fit and educating pt how to use new compression pump. Educated pt to use pump for an hour each day. Continued with herringbone pattern on second bandage due to decrease in right forearm circumference.    Rehab Potential Excellent   Clinical Impairments Affecting Rehab Potential previous ALND and radiation, scoliosis contributing to decreased shoulder movement     PT Frequency 3x / week   PT Duration 4 weeks   PT Treatment/Interventions ADLs/Self Care Home Management;Patient/family education;Orthotic  Fit/Training;DME Instruction;Therapeutic exercise;Manual lymph drainage;Compression bandaging;Taping   PT Next Visit Plan ask if pt has questions about compression pump, once measurements are no longer decreasing have pt measured for sleeve/glove at [facility]. Continue complete decongestive therapy and bandaging  Reassess nighttime garment and consider velcro extenders    Consulted and Agree with Plan of Care Patient      Patient will benefit from skilled therapeutic intervention in order to improve the following deficits and impairments:  Decreased knowledge of use of DME, Increased edema, Postural dysfunction  Visit Diagnosis: Postmastectomy lymphedema       G-Codes - 07/08/2016 1532    Functional Assessment Tool Used (Outpatient Only) per clinical judgement   Functional Limitation Self care   Self Care Current Status (E0563) At least 20 percent but less than 40 percent impaired, limited or restricted   Self Care Goal Status (R2942) At least 1 percent but less than 20 percent impaired, limited or restricted      Problem List Patient Active Problem List   Diagnosis Date Noted  . Breast cancer of lower-outer quadrant of left female breast ([place]) 05/07/2016  . Genetic testing 05/21/2015  . Family history of breast cancer   . Breast cancer ([place]) 03/17/2012  . 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 Hyperlipidemia 09-Jul-2011  . PVC's (premature ventricular contractions) 09-Jul-2011  . History of breast cancer 02/03/2011    Allyson Sabal [facility] 07-08-2016, 3:33 PM  [place], [place], [address] Phone: [phone]   Fax:  [phone]  Name: Cheryl [name] MRN: [id number] Date of Birth: 12-20-1950  Allyson Sabal [place], PT 2016-07-08 3:33 PM

## 2016-06-26 ENCOUNTER — Other Ambulatory Visit: Payer: Self-pay | Admitting: *Deleted

## 2016-06-26 ENCOUNTER — Telehealth: Payer: Self-pay | Admitting: Oncology

## 2016-06-26 ENCOUNTER — Ambulatory Visit (HOSPITAL_BASED_OUTPATIENT_CLINIC_OR_DEPARTMENT_OTHER): Payer: Medicare Other | Admitting: Oncology

## 2016-06-26 ENCOUNTER — Ambulatory Visit
Admission: RE | Admit: 2016-06-26 | Discharge: 2016-06-26 | Disposition: A | Discharge status: Home / Self Care | Payer: Medicare Other | Source: Ambulatory Visit | Attending: Radiation Oncology | Admitting: Radiation Oncology

## 2016-06-26 VITALS — BP 127/68 | HR 71 | Resp 18 | Wt 138.6 lb

## 2016-06-26 DIAGNOSIS — Z510 Encounter for antineoplastic radiation therapy: Secondary | ICD-10-CM | POA: Diagnosis not present

## 2016-06-26 DIAGNOSIS — Z853 Personal history of malignant neoplasm of breast: Secondary | ICD-10-CM | POA: Diagnosis not present

## 2016-06-26 DIAGNOSIS — M8580 Other specified disorders of bone density and structure, unspecified site: Secondary | ICD-10-CM

## 2016-06-26 DIAGNOSIS — Z170 Estrogen receptor positive status [ER+]: Secondary | ICD-10-CM | POA: Diagnosis not present

## 2016-06-26 DIAGNOSIS — C50512 Malignant neoplasm of lower-outer quadrant of left female breast: Secondary | ICD-10-CM | POA: Diagnosis not present

## 2016-06-26 MED ORDER — ANASTROZOLE 1 MG PO TABS: 1.0000 mg | ORAL_TABLET | Freq: Every day | ORAL | 3 refills | Status: DC

## 2016-06-26 MED FILL — ANASTROZOLE 1 MG TABLET: 1 | 90 days supply | Qty: 90 | Fill #0

## 2016-06-26 NOTE — Telephone Encounter (Signed)
Appointments scheduled per 06/26/16 los. Patient was given a copy of the AVS report and appointment schedule per 06/26/16 los. °

## 2016-06-26 NOTE — Progress Notes (Signed)
  [place] OFFICE PROGRESS NOTE   Diagnosis: Breast cancer  INTERVAL HISTORY:   Ms. Rue returns as scheduled. She is completing adjuvant radiation to the left breast. No new complaint. She is followed at the lymphedema clinic for the right arm lymphedema. She currently has a wrap in place and is being fitted for a lymphedema sleeve.  Objective:  Vital signs in last 24 hours:  Blood pressure 127/68, pulse 71, resp. rate 18, weight 138 lb 9.6 oz (62.9 kg), SpO2 100 %.    HEENT: Neck without mass Lymphatics: No cervical or supraclavicular nodes Resp: Lungs clear bilaterally Cardio: Regular rate and rhythm GI: No hepatosplenomegaly Vascular: No leg edema, lymphedema wrap over the right arm and hand Breasts: Faint erythema over the left breast, healed lumpectomy and axillary incisions      Medications: I have reviewed the patient's current medications.  Assessment/Plan: 1.Stage III right-sided breast cancer diagnosed in 1999. She remains in clinical remission. She completed adjuvant Femara therapy at the end of August 2009.  2. Left renal lesion on CT scan in April 2008 - a renal ultrasound confirmed bilateral renal cysts.  3. Osteopenia - she continues vitamin D.  4. 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  5. BRCA2 variant of unclear clinical significance, with a significant family history of breast cancer. Negative breast/ovarian gene panel 06/15/2015 6. History of hematuria - reports being diagnosed with a renal stone by Dr Diona Fanti.  7. Left breast cancer-invasive lobular carcinoma, HER-2 negative, ER positive, PR positive, Ki-20 percent confirmed on a core biopsy of a left lower outer quadrant mass on 03/10/2016  Left lumpectomy an sentinel lymph node biopsy01/17/2018,1.5 cm lobular carcinoma, grade 2,pT1c,pN0  Oncotype-score 17, low risk  Initiation of adjuvant left breast radiation 06/12/2016    Disposition:  Mr. Finnan  appears stable. She will complete adjuvant radiation over the next several weeks. The Oncotype score returned in the low risk category. I do not recommend adjuvant chemotherapy secondary to the protected low absolute benefit. She will begin adjuvant hormonal therapy at the completion of radiation. She will take a remedy ask beginning 07/28/2016. We reviewed the potential toxicities associated with Arimidex and she agrees to proceed. She will be scheduled for a bone density scan with the next mammogram.  Ms. Muma will return for an office visit in July. She will contact us in the interim as needed.  Betsy Coder, MD  06/26/2016  8:22 AM

## 2016-06-27 ENCOUNTER — Ambulatory Visit
Admission: RE | Admit: 2016-06-27 | Discharge: 2016-06-27 | Disposition: A | Discharge status: Home / Self Care | Payer: Medicare Other | Source: Ambulatory Visit | Attending: Radiation Oncology | Admitting: Radiation Oncology

## 2016-06-27 ENCOUNTER — Ambulatory Visit: Payer: Medicare Other | Admitting: Physical Therapy

## 2016-06-27 DIAGNOSIS — Z170 Estrogen receptor positive status [ER+]: Secondary | ICD-10-CM | POA: Diagnosis not present

## 2016-06-27 DIAGNOSIS — L599 Disorder of the skin and subcutaneous tissue related to radiation, unspecified: Secondary | ICD-10-CM

## 2016-06-27 DIAGNOSIS — I972 Postmastectomy lymphedema syndrome: Secondary | ICD-10-CM

## 2016-06-27 DIAGNOSIS — R293 Abnormal posture: Secondary | ICD-10-CM | POA: Diagnosis not present

## 2016-06-27 DIAGNOSIS — Z510 Encounter for antineoplastic radiation therapy: Secondary | ICD-10-CM | POA: Diagnosis not present

## 2016-06-27 DIAGNOSIS — C50512 Malignant neoplasm of lower-outer quadrant of left female breast: Secondary | ICD-10-CM | POA: Diagnosis not present

## 2016-06-27 NOTE — Therapy (Signed)
[place], [place], [address] Phone: [phone]   Fax:  [phone]  Physical Therapy Treatment  Patient Details  Name: Cheryl [name] MRN: [id number] Date of Birth: 1950-08-13 Referring Provider: Dr. Sondra Come   Encounter Date: 06/27/2016      PT End of Session - 06/27/16 1250    Visit Number 11   Number of Visits 13   Date for PT Re-Evaluation 07/08/16   PT Start Time 0845   PT Stop Time 0930   PT Time Calculation (min) 45 min   Activity Tolerance Patient tolerated treatment well   Behavior During Therapy [facility] for tasks assessed/performed      Past Medical History:  Diagnosis Date  . Arthritis   . Breast cancer ([place]) 1999   right breast, Adriamycin Therapy  . Breast cancer of lower-outer quadrant of left female breast ([place]) 05/07/2016  . Cellulitis   . Chronic acquired lymphedema    rt arm, s/p right breast cancer  . GERD (gastroesophageal reflux disease)   . Headache   . 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 History of hiatal hernia   . History of kidney stones   . History of migraine   . Hyperlipidemia   . Kidney problem 1969   history of left kidney bleeding  . Osteopenia 02/2015   T score -2.0. Prior DEXA 2010 T score -1.7. FRAX 20%/2.1%. Based upon prior history of Fosamax treatment will hold on treatment now with repeat at 2 year interval for stability  . Strep throat    age 63  . Ventricular ectopy    chronic    Past Surgical History:  Procedure Laterality Date  . BREAST LUMPECTOMY WITH RADIOACTIVE SEED AND SENTINEL LYMPH NODE BIOPSY Left 05/07/2016   Procedure: LEFT BREAST LUMPECTOMY WITH RADIOACTIVE SEED AND LEFT AXILLARY SENTINEL LYMPH NODE BIOPSY;  Surgeon: Fanny Skates, MD;  Location: [place];  Service: General;  Laterality: Left;  . BREAST SURGERY     Mastectomy with TRAM  . CYSTOGRAM    . FINGER SURGERY Right 01/1999   finger on the hand  . FOOT SURGERY  2004,2015   . FOOT SURGERY Left 07/2005   3 toes  . left thumb  1/06  . right mastectomy  10-27-97   with tram flap  . right thumb  1/05  . TONSILLECTOMY AND ADENOIDECTOMY      There were no vitals filed for this visit.      Subjective Assessment - 06/27/16 1244    Subjective "I have a prescription to get my sleeve. I just have to call Tye Maryland for an appointment"    Pertinent History right mastectomy with ALND TRAM in 1996 with radiation She has had lymphedema in right arm 2005 and has been wearing a nighttime garment "most of the time"  Now with left breast cancer with lumpectomy with 2 sentinel nodes removed 05/07/2016  pt undergoing radition in a few weeks.    Patient Stated Goals to get arm a little smaller    Currently in Pain? No/denies               LYMPHEDEMA/ONCOLOGY QUESTIONNAIRE - 06/27/16 0844      Right Upper Extremity Lymphedema   15 cm Proximal to Olecranon Process 32 cm   10 cm Proximal to Olecranon Process 32 cm   Olecranon Process 29.5 cm   15 cm Proximal to Ulnar Styloid Process 28.5 cm   10 cm Proximal to Ulnar  Styloid Process 24.4 cm   Just Proximal to Ulnar Styloid Process 17 cm   Across Hand at [employer] 18 cm   At [place] of 2nd Digit 6.5 cm                  [facility] Adult PT Treatment/Exercise - 06/27/16 0001      Self-Care   Other Self-Care Comments  note for Lane for Class 2 mondi compression sleeve and gauntlet      Manual Therapy   Manual Lymphatic Drainage (MLD) short neck, superficial and deep abdominals, right inguinal nodes and establishment of axilloinguinal pathway, RUE working proximal to distal (axilla to fingers) directing fluid towards pathways, re establishment of pathways   Compression Bandaging Biotone applied to Rt UE, thick stockinette from hand to axilla, elastomull to fingers 1-4, artiflex x1 with rectangle piece of peach medi striped foam at medial forearm inferior to antecubital fossa, 1-6cm, 1-8cm (doing this in  herring bone from wrist to elbow), 1-10 cm, 1-12 cm short stretch compression bandage from hand to axilla                         [facility] Term Clinic Goals - 06/27/16 1246      CC Long Term Goal  #1   Title Pt will have 2 cm ( to 15 cm )  reduction of circumference at right arm at ulnar styolid    Baseline 17 cm at eval, 17 at 06/11/2016   Status On-going     CC Long Term Goal  #2   Title Pt will have a 4 cm reduction ( to 24.5 cm ) of right forearm at 10 cm proximal to ulnar styolid    Baseline 28.5 at eval, 26.4 on 06/11/2106, 24.4 at 06/27/2016   Status Achieved     CC Long Term Goal  #3   Title Pt will know how to do self manual lymph drainage and compression bandaging with family assist so she can manage at home.    Status On-going     CC Long Term Goal  #4   Title pt will have knowledge of how to get assistance for compression garments and compression pumps    Status Achieved            Plan - 06/27/16 1250    Clinical Impression Statement Pt has shown stabalization of measurements of UE so feels she is ready for measurement of garments. Pt aware and will order it this week.    Rehab Potential Excellent   Clinical Impairments Affecting Rehab Potential previous ALND and radiation, scoliosis contributing to decreased shoulder movement    PT Duration 4 weeks   PT Treatment/Interventions ADLs/Self Care Home Management;Patient/family education;Orthotic Fit/Training;DME Instruction;Therapeutic exercise;Manual lymph drainage;Compression bandaging;Taping   PT Next Visit Plan check on nighttime garment.  continue with CDT until garment arrives?    Consulted and Agree with Plan of Care Patient      Patient will benefit from skilled therapeutic intervention in order to improve the following deficits and impairments:  Decreased knowledge of use of DME, Increased edema, Postural dysfunction  Visit Diagnosis: Postmastectomy lymphedema  Abnormal posture  Disorder of  the skin and subcutaneous tissue related to radiation, unspecified     Problem List Patient Active Problem List   Diagnosis Date Noted  . Breast cancer of lower-outer quadrant of left female breast ([place]) 05/07/2016  . Genetic testing 05/21/2015  . Family history of breast  cancer   . Breast cancer ([place]) 03/17/2012  . 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 Hyperlipidemia 06/26/2011  . PVC's (premature ventricular contractions) 06/26/2011  . History of breast cancer 02/03/2011   Donato Heinz. Owens Shark PT  Norwood Levo 06/27/2016, 12:53 PM  [place], [place], [address] Phone: [phone]   Fax:  [phone]  Name: Terriah Reggio Villena MRN: [id number] Date of Birth: Jun 16, 1950

## 2016-06-30 ENCOUNTER — Ambulatory Visit
Admission: RE | Admit: 2016-06-30 | Discharge: 2016-06-30 | Disposition: A | Discharge status: Home / Self Care | Payer: Medicare Other | Source: Ambulatory Visit | Attending: Radiation Oncology | Admitting: Radiation Oncology

## 2016-06-30 ENCOUNTER — Ambulatory Visit: Payer: Medicare Other

## 2016-06-30 DIAGNOSIS — C50512 Malignant neoplasm of lower-outer quadrant of left female breast: Secondary | ICD-10-CM | POA: Diagnosis not present

## 2016-06-30 DIAGNOSIS — Z510 Encounter for antineoplastic radiation therapy: Secondary | ICD-10-CM | POA: Diagnosis not present

## 2016-06-30 DIAGNOSIS — L599 Disorder of the skin and subcutaneous tissue related to radiation, unspecified: Secondary | ICD-10-CM

## 2016-06-30 DIAGNOSIS — I972 Postmastectomy lymphedema syndrome: Secondary | ICD-10-CM | POA: Diagnosis not present

## 2016-06-30 DIAGNOSIS — R293 Abnormal posture: Secondary | ICD-10-CM | POA: Diagnosis not present

## 2016-06-30 DIAGNOSIS — Z170 Estrogen receptor positive status [ER+]: Secondary | ICD-10-CM | POA: Diagnosis not present

## 2016-06-30 NOTE — Therapy (Signed)
[place], [place], [address] Phone: [phone]   Fax:  [phone]  Physical Therapy Treatment  Patient Details  Name: Cheryl [name] MRN: [id number] Date of Birth: Jun 02, 1950 Referring Provider: Dr. Sondra Come   Encounter Date: 06/30/2016      PT End of Session - 06/30/16 0919    Visit Number 12   Number of Visits 13   Date for PT Re-Evaluation 07/08/16   PT Start Time 0802   PT Stop Time 0901   PT Time Calculation (min) 59 min   Activity Tolerance Patient tolerated treatment well   Behavior During Therapy [facility] for tasks assessed/performed      Past Medical History:  Diagnosis Date  . Arthritis   . Breast cancer ([place]) 1999   right breast, Adriamycin Therapy  . Breast cancer of lower-outer quadrant of left female breast ([place]) 05/07/2016  . Cellulitis   . Chronic acquired lymphedema    rt arm, s/p right breast cancer  . GERD (gastroesophageal reflux disease)   . Headache   . 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 History of hiatal hernia   . History of kidney stones   . History of migraine   . Hyperlipidemia   . Kidney problem 1969   history of left kidney bleeding  . Osteopenia 02/2015   T score -2.0. Prior DEXA 2010 T score -1.7. FRAX 20%/2.1%. Based upon prior history of Fosamax treatment will hold on treatment now with repeat at 2 year interval for stability  . Strep throat    age 20  . Ventricular ectopy    chronic    Past Surgical History:  Procedure Laterality Date  . BREAST LUMPECTOMY WITH RADIOACTIVE SEED AND SENTINEL LYMPH NODE BIOPSY Left 05/07/2016   Procedure: LEFT BREAST LUMPECTOMY WITH RADIOACTIVE SEED AND LEFT AXILLARY SENTINEL LYMPH NODE BIOPSY;  Surgeon: Fanny Skates, MD;  Location: [facility];  Service: General;  Laterality: Left;  . BREAST SURGERY     Mastectomy with TRAM  . CYSTOGRAM    . FINGER SURGERY Right 01/1999   finger on the hand  . FOOT SURGERY   2004,2015  . FOOT SURGERY Left 07/2005   3 toes  . left thumb  1/06  . right mastectomy  10-27-97   with tram flap  . right thumb  1/05  . TONSILLECTOMY AND ADENOIDECTOMY      There were no vitals filed for this visit.      Subjective Assessment - 06/30/16 0806    Subjective I washed my bandages. I took them off late Saturday night and used my pump Saturday night and Sunday night.    Pertinent History right mastectomy with ALND TRAM in 1996 with radiation She has had lymphedema in right arm 2005 and has been wearing a nighttime garment "most of the time"  Now with left breast cancer with lumpectomy with 2 sentinel nodes removed 05/07/2016  pt undergoing radition in a few weeks.    Patient Stated Goals to get arm a little smaller    Currently in Pain? No/denies                         [facility] Adult PT Treatment/Exercise - 06/30/16 0001      Manual Therapy   Manual Lymphatic Drainage (MLD) short neck, superficial and deep abdominals, right inguinal nodes and establishment of right axillo-inguinal anastomosis, Rt UE working proximal to distal (lateral upper arm to  fingers) directing fluid towards anastomosis.   Compression Bandaging Biotone applied to Rt UE, thick stockinette from hand to axilla, elastomull to fingers 2-5, artiflex x1 with rectangle piece of peach medi striped foam at medial forearm inferior to antecubital fossa, 1-6cm, 1-8cm (doing this in herring bone from wrist to elbow), 1-10 cm, 1-12 cm short stretch compression bandage from hand to axilla                         Long Term Clinic Goals - 06/27/16 1246      CC Long Term Goal  #1   Title Pt will have 2 cm ( to 15 cm )  reduction of circumference at right arm at ulnar styolid    Baseline 17 cm at eval, 17 at 06/11/2016   Status On-going     CC Long Term Goal  #2   Title Pt will have a 4 cm reduction ( to 24.5 cm ) of right forearm at 10 cm proximal to ulnar styolid    Baseline 28.5 at  eval, 26.4 on 06/11/2106, 24.4 at 06/27/2016   Status Achieved     CC Long Term Goal  #3   Title Pt will know how to do self manual lymph drainage and compression bandaging with family assist so she can manage at home.    Status On-going     CC Long Term Goal  #4   Title pt will have knowledge of how to get assistance for compression garments and compression pumps    Status Achieved            Plan - 06/30/16 0919    Clinical Impression Statement Pt tried to make appointment with Tye Maryland, fitter, at [facility] last week but she is on vacation until this Wednesday. So pt plans to call Wednesday to schedule an appointment to get fitted for new compression sleeve. Her fibrosis at her Forearm seemed softe today than when this therapst last saw her last week so continued with herring bone patteran to foream with 8-cm bandage. Pt cotinues to report this feels good and she also notices her forearm feeling a little softer since wrapping this way. Pt will need to cont therapy until her garments arrive so as to prevent backflow of lymph fluid into her Rt UE.   Rehab Potential Excellent   Clinical Impairments Affecting Rehab Potential previous Rt ALND and radiation, scoliosis contributing to decreased shoulder movement; current radiation to Lt chest wall for Lt breast CA   PT Frequency 3x / week   PT Duration 4 weeks   PT Treatment/Interventions ADLs/Self Care Home Management;Patient/family education;Orthotic Fit/Training;DME Instruction;Therapeutic exercise;Manual lymph drainage;Compression bandaging;Taping   PT Next Visit Plan Remeasure circumference next; check on nighttime garment.  continue with CDT until garment arrives; probable renewal in next 1-2 visits to be able to cont treating pt until garments arrive   Consulted and Agree with Plan of Care Patient      Patient will benefit from skilled therapeutic intervention in order to improve the following deficits and impairments:  Decreased  knowledge of use of DME, Increased edema, Postural dysfunction  Visit Diagnosis: Postmastectomy lymphedema  Disorder of the skin and subcutaneous tissue related to radiation, unspecified     Problem List Patient Active Problem List   Diagnosis Date Noted  . Breast cancer of lower-outer quadrant of left female breast ([place]) 05/07/2016  . Genetic testing 05/21/2015  . Family history of breast cancer   .  Breast cancer ([place]) 03/17/2012  . 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 Hyperlipidemia 06/26/2011  . PVC's (premature ventricular contractions) 06/26/2011  . History of breast cancer 02/03/2011    [name], PTA 06/30/2016, 9:27 AM  [place], [place], [address] Phone: [phone]   Fax:  [phone]  Name: Cheryl [name] MRN: [id number] Date of Birth: January 26, 1951

## 2016-07-01 ENCOUNTER — Ambulatory Visit
Admission: RE | Admit: 2016-07-01 | Discharge: 2016-07-01 | Disposition: A | Discharge status: Home / Self Care | Payer: Medicare Other | Source: Ambulatory Visit | Attending: Radiation Oncology | Admitting: Radiation Oncology

## 2016-07-01 ENCOUNTER — Other Ambulatory Visit: Payer: Self-pay | Admitting: *Deleted

## 2016-07-01 ENCOUNTER — Encounter: Payer: Self-pay | Admitting: Radiation Oncology

## 2016-07-01 VITALS — BP 124/79 | HR 72 | Temp 99.6°F | Ht 60.0 in | Wt 139.4 lb

## 2016-07-01 DIAGNOSIS — Z170 Estrogen receptor positive status [ER+]: Principal | ICD-10-CM

## 2016-07-01 DIAGNOSIS — C50512 Malignant neoplasm of lower-outer quadrant of left female breast: Secondary | ICD-10-CM | POA: Diagnosis not present

## 2016-07-01 DIAGNOSIS — Z510 Encounter for antineoplastic radiation therapy: Secondary | ICD-10-CM | POA: Diagnosis not present

## 2016-07-01 MED ORDER — ROSUVASTATIN CALCIUM 40 MG PO TABS: 40.0000 mg | ORAL_TABLET | Freq: Every day | ORAL | 0 refills | Status: DC

## 2016-07-01 MED ORDER — EZETIMIBE 10 MG PO TABS: 10.0000 mg | ORAL_TABLET | Freq: Every day | ORAL | 0 refills | Status: DC

## 2016-07-01 MED FILL — EZETIMIBE 10 MG TABLET: 10 | 30 days supply | Qty: 30 | Fill #0

## 2016-07-01 MED FILL — ROSUVASTATIN CALCIUM 40 MG: 40 | 30 days supply | Qty: 30 | Fill #0

## 2016-07-01 NOTE — Progress Notes (Signed)
  Radiation Oncology         [phone] ________________________________  Name: Cheryl [name] MRN: [id number]  Date: 07/01/2016  DOB: [birth year]  Weekly Radiation Therapy Management    ICD-9-CM ICD-10-CM   1. Malignant neoplasm of lower-outer quadrant of left breast of female, estrogen receptor positive ([place]) 174.5 C50.512    V86.0 Z17.0      Current Dose: 25.2 Gy     Planned Dose:  60.4 Gy  Narrative . . . . . . . . The patient presents for routine under treatment assessment.                                    Cheryl [name] has completed 14 fractions to her left breast. She denies pain or fatigue. The patient is using Radiaplex as directed. She is still using a lymphedema wrap on right arm and fingers.                                  Set-up films were reviewed.                                 The chart was checked. Physical Findings. . .  height is 5' (1.524 m) and weight is 139 lb 6.4 oz (63.2 kg). Her oral temperature is 99.6 F (37.6 C). Her blood pressure is 124/79 and her pulse is 72. Her oxygen saturation is 100%.  Lungs are clear to auscultation bilaterally. Heart has regular rate and rhythm. Right arm and fingers tightly bound with lymphedema wrap. Erythema in the left inframammary fold area. Impression . . . . . . . The patient is tolerating radiation. Plan . . . . . . . . . . . . Continue treatment as planned. ________________________________   Blair Promise, PhD, MD  This document serves as a record of services personally performed by Gery Pray, MD. It was created on his behalf by Maryla Morrow, a trained medical scribe. The creation of this record is based on the scribe's personal observations and the provider's statements to them. This document has been checked and approved by the attending provider.

## 2016-07-01 NOTE — Progress Notes (Signed)
Cheryl [name] has completed 14 fractions to her left breast.  She denies having pain or fatigue.  She is using radiaplex as directed.  The skin on her left breast is slightly pink.  BP 124/79 (BP Location: Left Arm, Patient Position: Sitting)   Pulse 72   Temp 99.6 F (37.6 C) (Oral)   Ht 5' (1.524 m)   Wt 139 lb 6.4 oz (63.2 kg)   SpO2 100%   BMI 27.22 kg/m    Wt Readings from Last 3 Encounters:  07/01/16 139 lb 6.4 oz (63.2 kg)  06/26/16 138 lb 9.6 oz (62.9 kg)  06/24/16 138 lb 3.2 oz (62.7 kg)

## 2016-07-02 ENCOUNTER — Ambulatory Visit
Admission: RE | Admit: 2016-07-02 | Discharge: 2016-07-02 | Disposition: A | Discharge status: Home / Self Care | Payer: Medicare Other | Source: Ambulatory Visit | Attending: Radiation Oncology | Admitting: Radiation Oncology

## 2016-07-02 ENCOUNTER — Ambulatory Visit: Payer: Medicare Other | Admitting: Physical Therapy

## 2016-07-02 DIAGNOSIS — L599 Disorder of the skin and subcutaneous tissue related to radiation, unspecified: Secondary | ICD-10-CM | POA: Diagnosis not present

## 2016-07-02 DIAGNOSIS — I972 Postmastectomy lymphedema syndrome: Secondary | ICD-10-CM | POA: Diagnosis not present

## 2016-07-02 DIAGNOSIS — C50512 Malignant neoplasm of lower-outer quadrant of left female breast: Secondary | ICD-10-CM | POA: Diagnosis not present

## 2016-07-02 DIAGNOSIS — R293 Abnormal posture: Secondary | ICD-10-CM | POA: Diagnosis not present

## 2016-07-02 DIAGNOSIS — Z510 Encounter for antineoplastic radiation therapy: Secondary | ICD-10-CM | POA: Diagnosis not present

## 2016-07-02 DIAGNOSIS — Z170 Estrogen receptor positive status [ER+]: Secondary | ICD-10-CM | POA: Diagnosis not present

## 2016-07-02 NOTE — Therapy (Signed)
[place], [place], [address] Phone: [phone]   Fax:  [phone]  Physical Therapy Treatment  Patient Details  Name: Cheryl [name] MRN: [id number] Date of Birth: [birth year] Referring Provider: Dr. Sondra Come   Encounter Date: 07/02/2016      PT End of Session - 07/02/16 1158    Visit Number 13   Number of Visits 25   Date for PT Re-Evaluation 08/08/16   PT Start Time 1100   PT Stop Time 1145   PT Time Calculation (min) 45 min   Activity Tolerance Patient tolerated treatment well      Past Medical History:  Diagnosis Date  . Arthritis   . Breast cancer ([place]) 1999   right breast, Adriamycin Therapy  . Breast cancer of lower-outer quadrant of left female breast ([place]) 05/07/2016  . Cellulitis   . Chronic acquired lymphedema    rt arm, s/p right breast cancer  . GERD (gastroesophageal reflux disease)   . Headache   . 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 History of hiatal hernia   . History of kidney stones   . History of migraine   . Hyperlipidemia   . Kidney problem 1969   history of left kidney bleeding  . Osteopenia 02/2015   T score -2.0. Prior DEXA 2010 T score -1.7. FRAX 20%/2.1%. Based upon prior history of Fosamax treatment will hold on treatment now with repeat at 2 year interval for stability  . Strep throat    age 66  . Ventricular ectopy    chronic    Past Surgical History:  Procedure Laterality Date  . BREAST LUMPECTOMY WITH RADIOACTIVE SEED AND SENTINEL LYMPH NODE BIOPSY Left 05/07/2016   Procedure: LEFT BREAST LUMPECTOMY WITH RADIOACTIVE SEED AND LEFT AXILLARY SENTINEL LYMPH NODE BIOPSY;  Surgeon: Fanny Skates, MD;  Location: [facility];  Service: General;  Laterality: Left;  . BREAST SURGERY     Mastectomy with TRAM  . CYSTOGRAM    . FINGER SURGERY Right 01/1999   finger on the hand  . FOOT SURGERY  2004,2015  . FOOT SURGERY Left 07/2005   3 toes  . left thumb   1/06  . right mastectomy  10-27-97   with tram flap  . right thumb  1/05  . TONSILLECTOMY AND ADENOIDECTOMY      There were no vitals filed for this visit.      Subjective Assessment - 07/02/16 1108    Subjective Pt is doing well.  She will go today to get measured for compression sleeve then wear her old sleeve with tg soft over to support torn areas, use compession pump and wear nighttime garment tonight .  She returns again on Friday for bandaging until compression sleeve arrives    Pertinent History right mastectomy with ALND TRAM in 1996 with radiation She has had lymphedema in right arm 2005 and has been wearing a nighttime garment "most of the time"  Now with left breast cancer with lumpectomy with 2 sentinel nodes removed 05/07/2016  pt undergoing radition in a few weeks.    Patient Stated Goals to get arm a little smaller    Currently in Pain? No/denies               LYMPHEDEMA/ONCOLOGY QUESTIONNAIRE - 07/02/16 1108      Right Upper Extremity Lymphedema   15 cm Proximal to Olecranon Process 32 cm   10 cm Proximal to Olecranon Process 32 cm  Olecranon Process 29.3 cm   15 cm Proximal to Ulnar Styloid Process 29 cm   10 cm Proximal to Ulnar Styloid Process 24.4 cm   Just Proximal to Ulnar Styloid Process 17 cm   Across Hand at [employer] 18 cm   At [place] of 2nd Digit 6.4 cm                  [facility] Adult PT Treatment/Exercise - 07/02/16 0001      Self-Care   Other Self-Care Comments  pt brought in her Optiflow nighttime garment.  Pt tried in on and reviewed how she should fit it.  The fit was good with pt able to put more padding at forearm if needed.      Manual Therapy   Manual Lymphatic Drainage (MLD) short neck, superficial and deep abdominals, right inguinal nodes and establishment of right axillo-inguinal anastomosis, Rt UE working proximal to distal (lateral upper arm to fingers) directing fluid towards anastomosis.                         [place] Goals - 07/02/16 1202      CC Long Term Goal  #1   Title Pt will have 2 cm ( to 15 cm )  reduction of circumference at right arm at ulnar styolid    Baseline 17 cm at eval, 17 at 06/11/2016, 17 at 07/02/2016   Time 4   Period Weeks   Status On-going     CC Long Term Goal  #2   Title Pt will have a 4 cm reduction ( to 24.5 cm ) of right forearm at 10 cm proximal to ulnar styolid    Baseline 28.5 at eval, 26.4 on 06/11/2106, 24.4 at 06/27/2016, 24.4.at 07/02/2016   Time 4   Period Weeks   Status Achieved     CC Long Term Goal  #3   Title Pt will know how to do self manual lymph drainage and compression bandaging with family assist so she can manage at home.    Time 4   Status On-going     CC Long Term Goal  #4   Title pt will have knowledge of how to get assistance for compression garments and compression pumps    Status Achieved            Plan - 07/02/16 1159    Clinical Impression Statement Pt is pleased with the progress of her arm.  She does not expect her right arm to be the same size as left arm, but is happy with current reduction and is ready to get measured for compression sleeve.  She has a compression pump and nighttime garment to use at home also.  Will send for recertification for 4 more weeks to allow for visits to maintain reductions while awaiting for delivery of daytime compression sleeve.    Rehab Potential Excellent   Clinical Impairments Affecting Rehab Potential previous Rt ALND and radiation, scoliosis contributing to decreased shoulder movement; current radiation to Lt chest wall for Lt breast CA   PT Frequency 3x / week   PT Duration 4 weeks   PT Treatment/Interventions ADLs/Self Care Home Management;Patient/family education;Orthotic Fit/Training;DME Instruction;Therapeutic exercise;Manual lymph drainage;Compression bandaging;Taping   PT Next Visit Plan Continue with complete decongestive therapy until  sleeve is delivered and pt is able to manage lymphedema at home.    Consulted and Agree with Plan of Care Patient  Patient will benefit from skilled therapeutic intervention in order to improve the following deficits and impairments:  Decreased knowledge of use of DME, Increased edema, Postural dysfunction  Visit Diagnosis: Postmastectomy lymphedema  Disorder of the skin and subcutaneous tissue related to radiation, unspecified  Abnormal posture     Problem List Patient Active Problem List   Diagnosis Date Noted  . Breast cancer of lower-outer quadrant of left female breast ([place]) 05/07/2016  . Genetic testing 05/21/2015  . Family history of breast cancer   . Breast cancer ([place]) 03/17/2012  . 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 Hyperlipidemia 06/26/2011  . PVC's (premature ventricular contractions) 06/26/2011  . History of breast cancer 02/03/2011   Donato Heinz. Owens Shark PT  Norwood Levo 07/02/2016, 12:05 PM  [place], [place], [address] Phone: [phone]   Fax:  [phone]  Name: Cheryl [name] MRN: [id number] Date of Birth: 01/17/1951

## 2016-07-03 ENCOUNTER — Ambulatory Visit
Admission: RE | Admit: 2016-07-03 | Discharge: 2016-07-03 | Disposition: A | Discharge status: Home / Self Care | Payer: Medicare Other | Source: Ambulatory Visit | Attending: Radiation Oncology | Admitting: Radiation Oncology

## 2016-07-03 DIAGNOSIS — C50512 Malignant neoplasm of lower-outer quadrant of left female breast: Secondary | ICD-10-CM | POA: Diagnosis not present

## 2016-07-03 DIAGNOSIS — Z510 Encounter for antineoplastic radiation therapy: Secondary | ICD-10-CM | POA: Diagnosis not present

## 2016-07-03 DIAGNOSIS — Z170 Estrogen receptor positive status [ER+]: Secondary | ICD-10-CM | POA: Diagnosis not present

## 2016-07-04 ENCOUNTER — Ambulatory Visit: Payer: Medicare Other | Admitting: Physical Therapy

## 2016-07-04 ENCOUNTER — Ambulatory Visit
Admission: RE | Admit: 2016-07-04 | Discharge: 2016-07-04 | Disposition: A | Discharge status: Home / Self Care | Payer: Medicare Other | Source: Ambulatory Visit | Attending: Radiation Oncology | Admitting: Radiation Oncology

## 2016-07-04 DIAGNOSIS — I972 Postmastectomy lymphedema syndrome: Secondary | ICD-10-CM

## 2016-07-04 DIAGNOSIS — L599 Disorder of the skin and subcutaneous tissue related to radiation, unspecified: Secondary | ICD-10-CM | POA: Diagnosis not present

## 2016-07-04 DIAGNOSIS — R293 Abnormal posture: Secondary | ICD-10-CM | POA: Diagnosis not present

## 2016-07-04 DIAGNOSIS — C50512 Malignant neoplasm of lower-outer quadrant of left female breast: Secondary | ICD-10-CM | POA: Diagnosis not present

## 2016-07-04 DIAGNOSIS — Z170 Estrogen receptor positive status [ER+]: Secondary | ICD-10-CM | POA: Diagnosis not present

## 2016-07-04 DIAGNOSIS — Z510 Encounter for antineoplastic radiation therapy: Secondary | ICD-10-CM | POA: Diagnosis not present

## 2016-07-04 NOTE — Therapy (Signed)
[place], [place], [address] Phone: [phone]   Fax:  [phone]  Physical Therapy Treatment  Patient Details  Name: Cheryl [name] MRN: [id number] Date of Birth: [birth year] Referring Provider: Dr. Sondra Come   Encounter Date: 07/04/2016      PT End of Session - 07/04/16 0904    Visit Number 14   Number of Visits 25   Date for PT Re-Evaluation 08/08/16   PT Start Time 0800   PT Stop Time 0900   PT Time Calculation (min) 60 min   Activity Tolerance Patient tolerated treatment well   Behavior During Therapy [facility] for tasks assessed/performed      Past Medical History:  Diagnosis Date  . Arthritis   . Breast cancer ([place]) 1999   right breast, Adriamycin Therapy  . Breast cancer of lower-outer quadrant of left female breast ([place]) 05/07/2016  . Cellulitis   . Chronic acquired lymphedema    rt arm, s/p right breast cancer  . GERD (gastroesophageal reflux disease)   . Headache   . 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 History of hiatal hernia   . History of kidney stones   . History of migraine   . Hyperlipidemia   . Kidney problem 1969   history of left kidney bleeding  . Osteopenia 02/2015   T score -2.0. Prior DEXA 2010 T score -1.7. FRAX 20%/2.1%. Based upon prior history of Fosamax treatment will hold on treatment now with repeat at 2 year interval for stability  . Strep throat    age 42  . Ventricular ectopy    chronic    Past Surgical History:  Procedure Laterality Date  . BREAST LUMPECTOMY WITH RADIOACTIVE SEED AND SENTINEL LYMPH NODE BIOPSY Left 05/07/2016   Procedure: LEFT BREAST LUMPECTOMY WITH RADIOACTIVE SEED AND LEFT AXILLARY SENTINEL LYMPH NODE BIOPSY;  Surgeon: Fanny Skates, MD;  Location: [place];  Service: General;  Laterality: Left;  . BREAST SURGERY     Mastectomy with TRAM  . CYSTOGRAM    . FINGER SURGERY Right 01/1999   finger on the hand  . FOOT SURGERY   2004,2015  . FOOT SURGERY Left 07/2005   3 toes  . left thumb  1/06  . right mastectomy  10-27-97   with tram flap  . right thumb  1/05  . TONSILLECTOMY AND ADENOIDECTOMY      There were no vitals filed for this visit.      Subjective Assessment - 07/04/16 0803    Subjective Pt got measured for her sleeve the other day and it got sent in. She expects it in about 2 week    Pertinent History right mastectomy with ALND TRAM in 1996 with radiation She has had lymphedema in right arm 2005 and has been wearing a nighttime garment "most of the time"  Now with left breast cancer with lumpectomy with 2 sentinel nodes removed 05/07/2016  pt undergoing radition in a few weeks.    Patient Stated Goals to get arm a little smaller                          [facility] Adult PT Treatment/Exercise - 07/04/16 0001      Manual Therapy   Manual Lymphatic Drainage (MLD) short neck, superficial and deep abdominals, right inguinal nodes and establishment of right axillo-inguinal anastomosis, Rt UE working proximal to distal (lateral upper arm to fingers) directing fluid towards anastomosis.  Compression Bandaging Biotone applied to Rt UE, thick stockinette from hand to axilla, thin foam applied to back of hand, elastomull to fingers 2-4, artiflex x1 with rectangle piece of peach medi striped foam at medial forearm inferior to antecubital fossa, 1-6cm, 1-8cm (doing this in herring bone from wrist to elbow), 1-10 cm, 1-12 cm short stretch compression bandage from hand to axilla                         Long Term Clinic Goals - 07/02/16 1202      CC Long Term Goal  #1   Title Pt will have 2 cm ( to 15 cm )  reduction of circumference at right arm at ulnar styolid    Baseline 17 cm at eval, 17 at 06/11/2016, 17 at 07/02/2016   Time 4   Period Weeks   Status On-going     CC Long Term Goal  #2   Title Pt will have a 4 cm reduction ( to 24.5 cm ) of right forearm at 10 cm proximal to  ulnar styolid    Baseline 28.5 at eval, 26.4 on 06/11/2106, 24.4 at 06/27/2016, 24.4.at 07/02/2016   Time 4   Period Weeks   Status Achieved     CC Long Term Goal  #3   Title Pt will know how to do self manual lymph drainage and compression bandaging with family assist so she can manage at home.    Time 4   Status On-going     CC Long Term Goal  #4   Title pt will have knowledge of how to get assistance for compression garments and compression pumps    Status Achieved            Plan - 07/04/16 0905    Clinical Impression Statement Pt has a circumferential indentation around forearm from ?? It could be from application of sleeve over tg soft for reinforcement of sleeve since it was torn.  After wrapping arm today, noticed purplish color of fingertips.  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  Arm was completely rewrapped and reapplied looser with close monitoring of color of fingertips which did not get purple.  Pt instructed to continue to monitor fingertip color and remove bandages if they are dark in color.    Rehab Potential Excellent   Clinical Impairments Affecting Rehab Potential previous Rt ALND and radiation, scoliosis contributing to decreased shoulder movement; current radiation to Lt chest wall for Lt breast CA   PT Duration 4 weeks   PT Next Visit Plan Continue with complete decongestive therapy until sleeve is delivered and pt is able to manage lymphedema at home.       Patient will benefit from skilled therapeutic intervention in order to improve the following deficits and impairments:  Decreased knowledge of use of DME, Increased edema, Postural dysfunction  Visit Diagnosis: Postmastectomy lymphedema  Disorder of the skin and subcutaneous tissue related to radiation, unspecified  Abnormal posture     Problem List Patient Active Problem List   Diagnosis Date Noted  . Breast cancer of lower-outer quadrant of left female breast ([place]) 05/07/2016  .  Genetic testing 05/21/2015  . Family history of breast cancer   . Breast cancer ([place]) 03/17/2012  . 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 Hyperlipidemia 06/26/2011  . PVC's (premature ventricular contractions) 06/26/2011  . History of breast cancer 02/03/2011   Donato Heinz. Owens Shark, PT  Norwood Levo 07/04/2016, 9:13  [place], [place], [address] Phone: [phone]   Fax:  [phone]  Name: Cheryl [name] MRN: [id number] Date of Birth: 07-10-1950

## 2016-07-07 ENCOUNTER — Ambulatory Visit
Admission: RE | Admit: 2016-07-07 | Discharge: 2016-07-07 | Disposition: A | Discharge status: Home / Self Care | Payer: Medicare Other | Source: Ambulatory Visit | Attending: Radiation Oncology | Admitting: Radiation Oncology

## 2016-07-07 ENCOUNTER — Ambulatory Visit: Payer: Medicare Other

## 2016-07-07 DIAGNOSIS — L599 Disorder of the skin and subcutaneous tissue related to radiation, unspecified: Secondary | ICD-10-CM | POA: Diagnosis not present

## 2016-07-07 DIAGNOSIS — I972 Postmastectomy lymphedema syndrome: Secondary | ICD-10-CM

## 2016-07-07 DIAGNOSIS — R293 Abnormal posture: Secondary | ICD-10-CM | POA: Diagnosis not present

## 2016-07-07 DIAGNOSIS — Z170 Estrogen receptor positive status [ER+]: Secondary | ICD-10-CM | POA: Diagnosis not present

## 2016-07-07 DIAGNOSIS — Z510 Encounter for antineoplastic radiation therapy: Secondary | ICD-10-CM | POA: Diagnosis not present

## 2016-07-07 DIAGNOSIS — C50512 Malignant neoplasm of lower-outer quadrant of left female breast: Secondary | ICD-10-CM | POA: Diagnosis not present

## 2016-07-07 NOTE — Therapy (Signed)
[place], [place], [address] Phone: [phone]   Fax:  [phone]  Physical Therapy Treatment  Patient Details  Name: Cheryl [name] MRN: [id number] Date of Birth: 04-Feb-1951 Referring Provider: Dr. Sondra Come   Encounter Date: 07/07/2016      PT End of Session - 07/07/16 1106    Visit Number 15   Number of Visits 25   Date for PT Re-Evaluation 08/08/16   PT Start Time 0801   PT Stop Time 0174   PT Time Calculation (min) 46 min   Activity Tolerance Patient tolerated treatment well   Behavior During Therapy [facility] for tasks assessed/performed      Past Medical History:  Diagnosis Date  . Arthritis   . Breast cancer ([place]) 1999   right breast, Adriamycin Therapy  . Breast cancer of lower-outer quadrant of left female breast ([place]) 05/07/2016  . Cellulitis   . Chronic acquired lymphedema    rt arm, s/p right breast cancer  . GERD (gastroesophageal reflux disease)   . Headache   . 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 History of hiatal hernia   . History of kidney stones   . History of migraine   . Hyperlipidemia   . Kidney problem 1969   history of left kidney bleeding  . Osteopenia 02/2015   T score -2.0. Prior DEXA 2010 T score -1.7. FRAX 20%/2.1%. Based upon prior history of Fosamax treatment will hold on treatment now with repeat at 2 year interval for stability  . Strep throat    age 30  . Ventricular ectopy    chronic    Past Surgical History:  Procedure Laterality Date  . BREAST LUMPECTOMY WITH RADIOACTIVE SEED AND SENTINEL LYMPH NODE BIOPSY Left 05/07/2016   Procedure: LEFT BREAST LUMPECTOMY WITH RADIOACTIVE SEED AND LEFT AXILLARY SENTINEL LYMPH NODE BIOPSY;  Surgeon: Fanny Skates, MD;  Location: [place];  Service: General;  Laterality: Left;  . BREAST SURGERY     Mastectomy with TRAM  . CYSTOGRAM    . FINGER SURGERY Right 01/1999   finger on the hand  . FOOT SURGERY   2004,2015  . FOOT SURGERY Left 07/2005   3 toes  . left thumb  1/06  . right mastectomy  10-27-97   with tram flap  . right thumb  1/05  . TONSILLECTOMY AND ADENOIDECTOMY      There were no vitals filed for this visit.      Subjective Assessment - 07/07/16 0804    Subjective I left my bandages on all weekend so my hand could reduce.    Pertinent History right mastectomy with ALND TRAM in 1996 with radiation She has had lymphedema in right arm 2005 and has been wearing a nighttime garment "most of the time"  Now with left breast cancer with lumpectomy with 2 sentinel nodes removed 05/07/2016  pt undergoing radition in a few weeks.    Patient Stated Goals to get arm a little smaller    Currently in Pain? No/denies               LYMPHEDEMA/ONCOLOGY QUESTIONNAIRE - 07/07/16 0806      Right Upper Extremity Lymphedema   Just Proximal to Ulnar Styloid Process 16.8 cm   Across Hand at [employer] 18.3 cm   At [place] of 2nd Digit 6.6 cm                  [facility] Adult PT  Treatment/Exercise - 07/07/16 0001      Manual Therapy   Manual Lymphatic Drainage (MLD) short neck, superficial and deep abdominals, right inguinal nodes and establishment of right axillo-inguinal anastomosis, Rt UE working proximal to distal (lateral upper arm to fingers) directing fluid towards anastomosis.   Compression Bandaging Biotone applied to Rt UE, thick stockinette from hand to axilla, thin foam applied to back of hand, elastomull to fingers 2-4, artiflex x1 with rectangle piece of peach medi striped foam at medial forearm inferior to antecubital fossa, 1-6cm, 1-8cm (doing this in herring bone from wrist to elbow), 1-10 cm, 1-12 cm short stretch compression bandage from hand to axilla                         Long Term Clinic Goals - 07/02/16 1202      CC Long Term Goal  #1   Title Pt will have 2 cm ( to 15 cm )  reduction of circumference at right arm at ulnar styolid     Baseline 17 cm at eval, 17 at 06/11/2016, 17 at 07/02/2016   Time 4   Period Weeks   Status On-going     CC Long Term Goal  #2   Title Pt will have a 4 cm reduction ( to 24.5 cm ) of right forearm at 10 cm proximal to ulnar styolid    Baseline 28.5 at eval, 26.4 on 06/11/2106, 24.4 at 06/27/2016, 24.4.at 07/02/2016   Time 4   Period Weeks   Status Achieved     CC Long Term Goal  #3   Title Pt will know how to do self manual lymph drainage and compression bandaging with family assist so she can manage at home.    Time 4   Status On-going     CC Long Term Goal  #4   Title pt will have knowledge of how to get assistance for compression garments and compression pumps    Status Achieved            Plan - 07/07/16 1107    Clinical Impression Statement Pt had no problems with bandages over weekend and in fact was able to keep them on all weekend and were removed at clinic this morning. No questionable indentations at forearm today and pt reported her arm looking better than it did at last session. Continued with manual lymph drainage and compression bandaging to decrease backflow of her lymphedema until her garments arrive.   Rehab Potential Excellent   Clinical Impairments Affecting Rehab Potential previous Rt ALND and radiation, scoliosis contributing to decreased shoulder movement; current radiation to Lt chest wall for Lt breast CA   PT Frequency 3x / week   PT Duration 4 weeks   PT Treatment/Interventions ADLs/Self Care Home Management;Patient/family education;Orthotic Fit/Training;DME Instruction;Therapeutic exercise;Manual lymph drainage;Compression bandaging;Taping   PT Next Visit Plan Continue with complete decongestive therapy until sleeve is delivered and pt is able to manage lymphedema at home.    Consulted and Agree with Plan of Care Patient      Patient will benefit from skilled therapeutic intervention in order to improve the following deficits and impairments:  Decreased  knowledge of use of DME, Increased edema, Postural dysfunction  Visit Diagnosis: Postmastectomy lymphedema  Disorder of the skin and subcutaneous tissue related to radiation, unspecified  Abnormal posture     Problem List Patient Active Problem List   Diagnosis Date Noted  . Breast cancer of lower-outer quadrant of  left female breast ([place]) 05/07/2016  . Genetic testing 05/21/2015  . Family history of breast cancer   . Breast cancer ([place]) 03/17/2012  . 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 Hyperlipidemia 06/26/2011  . PVC's (premature ventricular contractions) 06/26/2011  . History of breast cancer 02/03/2011    [name], PTA 07/07/2016, 11:13 AM  [place], [place], [address] Phone: [phone]   Fax:  [phone]  Name: Cheryl [name] MRN: [id number] Date of Birth: 09/14/1950

## 2016-07-08 ENCOUNTER — Ambulatory Visit
Admission: RE | Admit: 2016-07-08 | Discharge: 2016-07-08 | Disposition: A | Discharge status: Home / Self Care | Payer: Medicare Other | Source: Ambulatory Visit | Attending: Radiation Oncology | Admitting: Radiation Oncology

## 2016-07-08 VITALS — BP 130/91 | HR 72 | Temp 98.1°F | Resp 16 | Wt 139.4 lb

## 2016-07-08 DIAGNOSIS — Z170 Estrogen receptor positive status [ER+]: Secondary | ICD-10-CM | POA: Diagnosis not present

## 2016-07-08 DIAGNOSIS — Z510 Encounter for antineoplastic radiation therapy: Secondary | ICD-10-CM | POA: Diagnosis not present

## 2016-07-08 DIAGNOSIS — C50512 Malignant neoplasm of lower-outer quadrant of left female breast: Secondary | ICD-10-CM

## 2016-07-08 NOTE — Progress Notes (Signed)
  Radiation Oncology         ([phone]) [phone] ________________________________  Name: Cheryl [name] MRN: [id number]  Date: 07/08/2016  DOB: 01-08-1951  Weekly Radiation Therapy Management    ICD-9-CM ICD-10-CM   1. Malignant neoplasm of lower-outer quadrant of left breast of female, estrogen receptor positive (HCC) 174.5 C50.512    V86.0 Z17.0      Current Dose: 34.2 Gy     Planned Dose:  60.4 Gy  Narrative . . . . . . . . The patient presents for routine under treatment assessment.                                    Odena Stcyr has completed 19 fractions to her left breast. Pt denies any pain but does endorse occasional fatigue. She states her appetite is stable. Pt endorses radiaplex BID and Alra deodorant.  Pt has her right arm wrapped 3 times a week in outpatient setting at [facility] for lymphedema as she awaits a custom sleeve. Pt states custom sleeve (from [place]) will arrive in two weeks. She endorses a decrease in swelling.                                   Set-up films were reviewed.                                 The chart was checked. Physical Findings. . .  weight is 139 lb 6.4 oz (63.2 kg). Her oral temperature is 98.1 F (36.7 C). Her blood pressure is 130/91 (abnormal) and her pulse is 72. Her respiration is 16 and oxygen saturation is 100%.  Lungs are clear to auscultation bilaterally. Heart has regular rate and rhythm. Right arm and fingers tightly bound with lymphedema wrap. Erythema in the left inframammary fold area.  Impression . . . . . . . The patient is tolerating radiation. Plan . . . . . . . . . . . . Continue treatment as planned. ________________________________   Blair Promise, PhD, MD  This document serves as a record of services personally performed by Gery Pray, MD. It was created on his behalf by Maryla Morrow, a trained medical scribe. The creation of this record is based on the scribe's personal observations and the provider's statements to them.  This document has been checked and approved by the attending provider.

## 2016-07-08 NOTE — Progress Notes (Signed)
Cheryl [name] completed 19th fraction to left breast.  Patient denies any pain.  Patient states that she has occasional fatigue.  Patient states her appetite is stable.  She states she uses radiaplex twice a day and deodorant.  She has her right arm wrapped 3 times weekly in outpatient setting at [facility] for lymphadema while she is awaiting her custom sleeve that was ordered.  Skin to left breast is intact with mild redness primarily under left breast.  Vitals:   07/08/16 1047  BP: (!) 130/91  Pulse: 72  Resp: 16  Temp: 98.1 F (36.7 C)  TempSrc: Oral  SpO2: 100%  Weight: 139 lb 6.4 oz (63.2 kg)    Wt Readings from Last 3 Encounters:  07/08/16 139 lb 6.4 oz (63.2 kg)  07/01/16 139 lb 6.4 oz (63.2 kg)  06/26/16 138 lb 9.6 oz (62.9 kg)

## 2016-07-09 ENCOUNTER — Ambulatory Visit: Payer: Medicare Other | Admitting: Physical Therapy

## 2016-07-09 ENCOUNTER — Ambulatory Visit
Admission: RE | Admit: 2016-07-09 | Discharge: 2016-07-09 | Disposition: A | Discharge status: Home / Self Care | Payer: Medicare Other | Source: Ambulatory Visit | Attending: Radiation Oncology | Admitting: Radiation Oncology

## 2016-07-09 DIAGNOSIS — L599 Disorder of the skin and subcutaneous tissue related to radiation, unspecified: Secondary | ICD-10-CM | POA: Diagnosis not present

## 2016-07-09 DIAGNOSIS — Z170 Estrogen receptor positive status [ER+]: Secondary | ICD-10-CM | POA: Diagnosis not present

## 2016-07-09 DIAGNOSIS — R293 Abnormal posture: Secondary | ICD-10-CM

## 2016-07-09 DIAGNOSIS — Z510 Encounter for antineoplastic radiation therapy: Secondary | ICD-10-CM | POA: Diagnosis not present

## 2016-07-09 DIAGNOSIS — I972 Postmastectomy lymphedema syndrome: Secondary | ICD-10-CM

## 2016-07-09 DIAGNOSIS — C50512 Malignant neoplasm of lower-outer quadrant of left female breast: Secondary | ICD-10-CM | POA: Diagnosis not present

## 2016-07-09 NOTE — Therapy (Signed)
[place], [place], [address] Phone: [phone]   Fax:  [phone]  Physical Therapy Treatment  Patient Details  Name: Cheryl [name] MRN: [id number] Date of Birth: 1951/01/27 Referring Provider: Dr. Sondra Come   Encounter Date: 07/09/2016      PT End of Session - 07/09/16 0933    Visit Number 16   Number of Visits 25   Date for PT Re-Evaluation 08/08/16   PT Start Time 0845   PT Stop Time 0930   PT Time Calculation (min) 45 min   Activity Tolerance Patient tolerated treatment well   Behavior During Therapy [facility] for tasks assessed/performed      Past Medical History:  Diagnosis Date  . Arthritis   . Breast cancer ([place]) 1999   right breast, Adriamycin Therapy  . Breast cancer of lower-outer quadrant of left female breast ([place]) 05/07/2016  . Cellulitis   . Chronic acquired lymphedema    rt arm, s/p right breast cancer  . GERD (gastroesophageal reflux disease)   . Headache   . 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 History of hiatal hernia   . History of kidney stones   . History of migraine   . Hyperlipidemia   . Kidney problem 1969   history of left kidney bleeding  . Osteopenia 02/2015   T score -2.0. Prior DEXA 2010 T score -1.7. FRAX 20%/2.1%. Based upon prior history of Fosamax treatment will hold on treatment now with repeat at 2 year interval for stability  . Strep throat    age 2  . Ventricular ectopy    chronic    Past Surgical History:  Procedure Laterality Date  . BREAST LUMPECTOMY WITH RADIOACTIVE SEED AND SENTINEL LYMPH NODE BIOPSY Left 05/07/2016   Procedure: LEFT BREAST LUMPECTOMY WITH RADIOACTIVE SEED AND LEFT AXILLARY SENTINEL LYMPH NODE BIOPSY;  Surgeon: Fanny Skates, MD;  Location: [place];  Service: General;  Laterality: Left;  . BREAST SURGERY     Mastectomy with TRAM  . CYSTOGRAM    . FINGER SURGERY Right 01/1999   finger on the hand  . FOOT SURGERY   2004,2015  . FOOT SURGERY Left 07/2005   3 toes  . left thumb  1/06  . right mastectomy  10-27-97   with tram flap  . right thumb  1/05  . TONSILLECTOMY AND ADENOIDECTOMY      There were no vitals filed for this visit.      Subjective Assessment - 07/09/16 0931    Subjective Pt reports she is doing fine, just waiting for the garment to be delivered    Pertinent History right mastectomy with ALND TRAM in 1996 with radiation She has had lymphedema in right arm 2005 and has been wearing a nighttime garment "most of the time"  Now with left breast cancer with lumpectomy with 2 sentinel nodes removed 05/07/2016  pt undergoing radition in a few weeks.    Patient Stated Goals to get arm a little smaller    Currently in Pain? No/denies                         [facility] Adult PT Treatment/Exercise - 07/09/16 0001      Manual Therapy   Manual Lymphatic Drainage (MLD) short neck, superficial and deep abdominals, right inguinal nodes and establishment of right axillo-inguinal anastomosis, Rt UE working proximal to distal (lateral upper arm to fingers) directing fluid towards anastomosis. then  to sidelying for brief work on posterior anastamosis    Compression Bandaging Biotone applied to Rt UE, thick stockinette from hand to axilla, thin foam applied to back of hand, elastomull to fingers 2-4, artiflex x1 with rectangle piece of peach medi striped foam at medial forearm inferior to antecubital fossa, 1-6cm, 1-8cm (doing this in herring bone from wrist to elbow), 1-10 cm, 1-12 cm short stretch compression bandage from hand to axilla                         Long Term Clinic Goals - 07/02/16 1202      CC Long Term Goal  #1   Title Pt will have 2 cm ( to 15 cm )  reduction of circumference at right arm at ulnar styolid    Baseline 17 cm at eval, 17 at 06/11/2016, 17 at 07/02/2016   Time 4   Period Weeks   Status On-going     CC Long Term Goal  #2   Title Pt will have a  4 cm reduction ( to 24.5 cm ) of right forearm at 10 cm proximal to ulnar styolid    Baseline 28.5 at eval, 26.4 on 06/11/2106, 24.4 at 06/27/2016, 24.4.at 07/02/2016   Time 4   Period Weeks   Status Achieved     CC Long Term Goal  #3   Title Pt will know how to do self manual lymph drainage and compression bandaging with family assist so she can manage at home.    Time 4   Status On-going     CC Long Term Goal  #4   Title pt will have knowledge of how to get assistance for compression garments and compression pumps    Status Achieved            Plan - 07/09/16 0933    Clinical Impression Statement hand and lower forearm are doing very well with some fullness continuing around elbow. Continued with treatment until garment arrives    Rehab Potential Excellent   Clinical Impairments Affecting Rehab Potential previous Rt ALND and radiation, scoliosis contributing to decreased shoulder movement; current radiation to Lt chest wall for Lt breast CA   PT Frequency 3x / week   PT Duration 4 weeks   PT Next Visit Plan Continue with complete decongestive therapy until sleeve is delivered and pt is able to manage lymphedema at home.    Consulted and Agree with Plan of Care Patient      Patient will benefit from skilled therapeutic intervention in order to improve the following deficits and impairments:  Decreased knowledge of use of DME, Increased edema, Postural dysfunction  Visit Diagnosis: Postmastectomy lymphedema  Disorder of the skin and subcutaneous tissue related to radiation, unspecified  Abnormal posture     Problem List Patient Active Problem List   Diagnosis Date Noted  . Breast cancer of lower-outer quadrant of left female breast ([place]) 05/07/2016  . Genetic testing 05/21/2015  . Family history of breast cancer   . Breast cancer ([place]) 03/17/2012  . 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 Hyperlipidemia 06/26/2011  . PVC's (premature ventricular contractions) 06/26/2011  .  History of breast cancer 02/03/2011   Cheryl [name]. Owens Shark PT  Norwood Levo 07/09/2016, 9:36 AM  [place], [place], [address] Phone: [phone]   Fax:  [phone]  Name: Cheryl [name] MRN: [id number] Date of Birth: 1950-05-04

## 2016-07-10 ENCOUNTER — Ambulatory Visit
Admission: RE | Admit: 2016-07-10 | Discharge: 2016-07-10 | Disposition: A | Discharge status: Home / Self Care | Payer: Medicare Other | Source: Ambulatory Visit | Attending: Radiation Oncology | Admitting: Radiation Oncology

## 2016-07-10 DIAGNOSIS — Z170 Estrogen receptor positive status [ER+]: Secondary | ICD-10-CM | POA: Diagnosis not present

## 2016-07-10 DIAGNOSIS — C50512 Malignant neoplasm of lower-outer quadrant of left female breast: Secondary | ICD-10-CM | POA: Diagnosis not present

## 2016-07-10 DIAGNOSIS — Z510 Encounter for antineoplastic radiation therapy: Secondary | ICD-10-CM | POA: Diagnosis not present

## 2016-07-11 ENCOUNTER — Ambulatory Visit
Admission: RE | Admit: 2016-07-11 | Discharge: 2016-07-11 | Disposition: A | Discharge status: Home / Self Care | Payer: Medicare Other | Source: Ambulatory Visit | Attending: Radiation Oncology | Admitting: Radiation Oncology

## 2016-07-11 ENCOUNTER — Encounter (HOSPITAL_COMMUNITY): Payer: Self-pay

## 2016-07-11 ENCOUNTER — Ambulatory Visit: Payer: Medicare Other | Admitting: Physical Therapy

## 2016-07-11 DIAGNOSIS — Z510 Encounter for antineoplastic radiation therapy: Secondary | ICD-10-CM | POA: Diagnosis not present

## 2016-07-11 DIAGNOSIS — L599 Disorder of the skin and subcutaneous tissue related to radiation, unspecified: Secondary | ICD-10-CM | POA: Diagnosis not present

## 2016-07-11 DIAGNOSIS — R293 Abnormal posture: Secondary | ICD-10-CM | POA: Diagnosis not present

## 2016-07-11 DIAGNOSIS — I972 Postmastectomy lymphedema syndrome: Secondary | ICD-10-CM

## 2016-07-11 DIAGNOSIS — Z170 Estrogen receptor positive status [ER+]: Secondary | ICD-10-CM | POA: Diagnosis not present

## 2016-07-11 DIAGNOSIS — C50512 Malignant neoplasm of lower-outer quadrant of left female breast: Secondary | ICD-10-CM | POA: Diagnosis not present

## 2016-07-11 NOTE — Therapy (Signed)
[place], [place], [address] Phone: [phone]   Fax:  [phone]  Physical Therapy Treatment  Patient Details  Name: Cheryl [name] MRN: [id number] Date of Birth: 12/04/1950 Referring Provider: Dr. Sondra Come   Encounter Date: 07/11/2016      PT End of Session - 07/11/16 0936    Visit Number 17   Number of Visits 25   Date for PT Re-Evaluation 08/08/16   PT Start Time 0842   PT Stop Time 0932   PT Time Calculation (min) 50 min   Activity Tolerance Patient tolerated treatment well   Behavior During Therapy [facility] for tasks assessed/performed      Past Medical History:  Diagnosis Date  . Arthritis   . Breast cancer ([place]) 1999   right breast, Adriamycin Therapy  . Breast cancer of lower-outer quadrant of left female breast ([place]) 05/07/2016  . Cellulitis   . Chronic acquired lymphedema    rt arm, s/p right breast cancer  . GERD (gastroesophageal reflux disease)   . Headache   . 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 History of hiatal hernia   . History of kidney stones   . History of migraine   . Hyperlipidemia   . Kidney problem 1969   history of left kidney bleeding  . Osteopenia 02/2015   T score -2.0. Prior DEXA 2010 T score -1.7. FRAX 20%/2.1%. Based upon prior history of Fosamax treatment will hold on treatment now with repeat at 2 year interval for stability  . Strep throat    age 66  . Ventricular ectopy    chronic    Past Surgical History:  Procedure Laterality Date  . BREAST LUMPECTOMY WITH RADIOACTIVE SEED AND SENTINEL LYMPH NODE BIOPSY Left 05/07/2016   Procedure: LEFT BREAST LUMPECTOMY WITH RADIOACTIVE SEED AND LEFT AXILLARY SENTINEL LYMPH NODE BIOPSY;  Surgeon: Fanny Skates, MD;  Location: [place];  Service: General;  Laterality: Left;  . BREAST SURGERY     Mastectomy with TRAM  . CYSTOGRAM    . FINGER SURGERY Right 01/1999   finger on the hand  . FOOT SURGERY   2004,2015  . FOOT SURGERY Left 07/2005   3 toes  . left thumb  1/06  . right mastectomy  10-27-97   with tram flap  . right thumb  1/05  . TONSILLECTOMY AND ADENOIDECTOMY      There were no vitals filed for this visit.      Subjective Assessment - 07/11/16 0845    Subjective I had to take it off last night.  It feels like it's wringing my arm.   Currently in Pain? No/denies                         [facility] Adult PT Treatment/Exercise - 07/11/16 0001      Manual Therapy   Manual Lymphatic Drainage (MLD) short neck, superficial and deep abdominals, right inguinal nodes and establishment of right axillo-inguinal anastomosis, Rt UE working proximal to distal (lateral upper arm to fingers) directing fluid towards anastomosis.   Compression Bandaging Biotone applied to Rt UE, thick stockinette from hand to axilla, thin foam applied to back of hand, elastomull to fingers 2-4, artiflex x1 with rectangle piece of peach medi striped foam at medial forearm inferior to antecubital fossa, and 1/4" gray foam at antecubital fossa, then 1-6cm, 1-8cm (doing, 1-10 cm (this in herringbone from wrist to elbow, and 1-12 cm short  stretch compression bandage from hand to axilla; also placed tape on fingers.                        [facility] Goals - 07/02/16 1202      CC Long Term Goal  #1   Title Pt will have 2 cm ( to 15 cm )  reduction of circumference at right arm at ulnar styolid    Baseline 17 cm at eval, 17 at 06/11/2016, 17 at 07/02/2016   Time 4   Period Weeks   Status On-going     CC Long Term Goal  #2   Title Pt will have a 4 cm reduction ( to 24.5 cm ) of right forearm at 10 cm proximal to ulnar styolid    Baseline 28.5 at eval, 26.4 on 06/11/2106, 24.4 at 06/27/2016, 24.4.at 07/02/2016   Time 4   Period Weeks   Status Achieved     CC Long Term Goal  #3   Title Pt will know how to do self manual lymph drainage and compression bandaging with family assist so  she can manage at home.    Time 4   Status On-going     CC Long Term Goal  #4   Title pt will have knowledge of how to get assistance for compression garments and compression pumps    Status Achieved            Plan - 07/11/16 0937    Clinical Impression Statement Pt. continues to do fine, though she had to remove bandages last night because they got uncomfortable.  She is waiting for her compression garments, and hopes they arrive before her last scheduled appointment next week. She does have her pump and finds it comfortable to use, but she can't use it while bandaged.   Rehab Potential Excellent   Clinical Impairments Affecting Rehab Potential previous Rt ALND and radiation, scoliosis contributing to decreased shoulder movement; current radiation to Lt chest wall for Lt breast CA   PT Frequency 3x / week   PT Duration 4 weeks   PT Treatment/Interventions ADLs/Self Care Home Management;Patient/family education;Orthotic Fit/Training;DME Instruction;Therapeutic exercise;Manual lymph drainage;Compression bandaging;Taping   PT Next Visit Plan Continue with complete decongestive therapy until sleeve is delivered and pt is able to manage lymphedema at home.    Consulted and Agree with Plan of Care Patient      Patient will benefit from skilled therapeutic intervention in order to improve the following deficits and impairments:  Decreased knowledge of use of DME, Increased edema, Postural dysfunction  Visit Diagnosis: Postmastectomy lymphedema  Disorder of the skin and subcutaneous tissue related to radiation, unspecified     Problem List Patient Active Problem List   Diagnosis Date Noted  . Breast cancer of lower-outer quadrant of left female breast ([place]) 05/07/2016  . Genetic testing 05/21/2015  . Family history of breast cancer   . Breast cancer ([place]) 03/17/2012  . 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 Hyperlipidemia 06/26/2011  . PVC's (premature ventricular contractions)  06/26/2011  . History of breast cancer 02/03/2011    SALISBURY,DONNA 07/11/2016, 9:39 AM  [address] [place], [place], [address] Phone: [phone]   Fax:  [phone]  Name: Juliene Kirsh Encalade MRN: [id number] Date of Birth: 21-Nov-1950  Serafina Royals, PT 07/11/16 9:39 AM

## 2016-07-14 ENCOUNTER — Ambulatory Visit
Admission: RE | Admit: 2016-07-14 | Discharge: 2016-07-14 | Disposition: A | Discharge status: Home / Self Care | Payer: Medicare Other | Source: Ambulatory Visit | Attending: Radiation Oncology | Admitting: Radiation Oncology

## 2016-07-14 ENCOUNTER — Encounter: Payer: Self-pay | Admitting: Physical Therapy

## 2016-07-14 ENCOUNTER — Ambulatory Visit: Payer: Medicare Other | Admitting: Physical Therapy

## 2016-07-14 DIAGNOSIS — R293 Abnormal posture: Secondary | ICD-10-CM | POA: Diagnosis not present

## 2016-07-14 DIAGNOSIS — C50512 Malignant neoplasm of lower-outer quadrant of left female breast: Secondary | ICD-10-CM | POA: Diagnosis not present

## 2016-07-14 DIAGNOSIS — I972 Postmastectomy lymphedema syndrome: Secondary | ICD-10-CM

## 2016-07-14 DIAGNOSIS — L599 Disorder of the skin and subcutaneous tissue related to radiation, unspecified: Secondary | ICD-10-CM | POA: Diagnosis not present

## 2016-07-14 DIAGNOSIS — Z510 Encounter for antineoplastic radiation therapy: Secondary | ICD-10-CM | POA: Diagnosis not present

## 2016-07-14 DIAGNOSIS — Z170 Estrogen receptor positive status [ER+]: Secondary | ICD-10-CM | POA: Diagnosis not present
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
## 2016-07-15 ENCOUNTER — Ambulatory Visit
Admission: RE | Admit: 2016-07-15 | Discharge: 2016-07-15 | Disposition: A | Discharge status: Home / Self Care | Payer: Medicare Other | Source: Ambulatory Visit | Attending: Radiation Oncology | Admitting: Radiation Oncology

## 2016-07-15 ENCOUNTER — Encounter: Payer: Self-pay | Admitting: Radiation Oncology

## 2016-07-15 VITALS — BP 117/76 | HR 73 | Temp 98.3°F | Ht 60.0 in | Wt 139.0 lb

## 2016-07-15 DIAGNOSIS — C50512 Malignant neoplasm of lower-outer quadrant of left female breast: Secondary | ICD-10-CM

## 2016-07-15 DIAGNOSIS — Z510 Encounter for antineoplastic radiation therapy: Secondary | ICD-10-CM | POA: Diagnosis not present

## 2016-07-15 DIAGNOSIS — Z170 Estrogen receptor positive status [ER+]: Secondary | ICD-10-CM | POA: Diagnosis not present

## 2016-07-15 NOTE — Progress Notes (Signed)
  Radiation Oncology         [phone] ________________________________  Name: Cheryl [name] MRN: [id number]  Date: 07/15/2016  DOB: 1950-11-12  Weekly Radiation Therapy Management    ICD-9-CM ICD-10-CM   1. Malignant neoplasm of lower-outer quadrant of left breast of female, estrogen receptor positive (HCC) 174.5 C50.512    V86.0 Z17.0      Current Dose: 43.2 Gy     Planned Dose:  60.4 Gy  Narrative . . . . . . . . The patient presents for routine under treatment assessment.                                    Cheryl [name] has completed 24 fractions to her LEFT breast.  She denies having pain or an increase in fatigue.  She is using radiaplex.  The skin on her left breast is pink and is red underneath.  She has been given hydrogel pads to try. The patient's RIGHT arm is tightly bound for lymphedema from previous treatment of the right breast.                                  Set-up films were reviewed.                                 The chart was checked. Physical Findings. . .  height is 5' (1.524 m) and weight is 139 lb (63 kg). Her oral temperature is 98.3 F (36.8 C). Her blood pressure is 117/76 and her pulse is 73. Her oxygen saturation is 100%.  Lungs are clear to auscultation bilaterally. Heart has regular rate and rhythm. Right arm and fingers tightly bound with lymphedema wrap. Mild erythema and hyperpigmentation of the left breast. Impression . . . . . . . The patient is tolerating radiation. Plan . . . . . . . . . . . . Continue treatment as planned. ________________________________   Blair Promise, PhD, MD  This document serves as a record of services personally performed by Gery Pray, MD. It was created on his behalf by Darcus Austin, a trained medical scribe. The creation of this record is based on the scribe's personal observations and the provider's statements to them. This document has been checked and approved by the attending provider.

## 2016-07-15 NOTE — Progress Notes (Signed)
Cheryl [name] has completed 24 fractions to her left breast.  She denies having pain or an increase in fatigue.  She is using radiaplex.  The skin on her left breast is pink and is red underneath.  She has been given hydrogel pads to try.  BP 117/76 (BP Location: Left Arm, Patient Position: Sitting)   Pulse 73   Temp 98.3 F (36.8 C) (Oral)   Ht 5' (1.524 m)   Wt 139 lb (63 kg)   SpO2 100%   BMI 27.15 kg/m    Wt Readings from Last 3 Encounters:  07/15/16 139 lb (63 kg)  07/08/16 139 lb 6.4 oz (63.2 kg)  07/01/16 139 lb 6.4 oz (63.2 kg)

## 2016-07-16 ENCOUNTER — Ambulatory Visit
Admission: RE | Admit: 2016-07-16 | Discharge: 2016-07-16 | Disposition: A | Discharge status: Home / Self Care | Payer: Medicare Other | Source: Ambulatory Visit | Attending: Radiation Oncology | Admitting: Radiation Oncology

## 2016-07-16 ENCOUNTER — Ambulatory Visit: Payer: Medicare Other

## 2016-07-16 DIAGNOSIS — R293 Abnormal posture: Secondary | ICD-10-CM

## 2016-07-16 DIAGNOSIS — Z170 Estrogen receptor positive status [ER+]: Secondary | ICD-10-CM | POA: Diagnosis not present

## 2016-07-16 DIAGNOSIS — L599 Disorder of the skin and subcutaneous tissue related to radiation, unspecified: Secondary | ICD-10-CM | POA: Diagnosis not present

## 2016-07-16 DIAGNOSIS — Z510 Encounter for antineoplastic radiation therapy: Secondary | ICD-10-CM | POA: Diagnosis not present

## 2016-07-16 DIAGNOSIS — I972 Postmastectomy lymphedema syndrome: Secondary | ICD-10-CM | POA: Diagnosis not present

## 2016-07-16 DIAGNOSIS — C50512 Malignant neoplasm of lower-outer quadrant of left female breast: Secondary | ICD-10-CM | POA: Diagnosis not present

## 2016-07-16 NOTE — Therapy (Signed)
[place], [place], [address] Phone: [phone]   Fax:  [phone]  Physical Therapy Treatment  Patient Details  Name: Cheryl [name] MRN: [id number] Date of Birth: 03-13-1951 Referring Provider: Dr. Sondra Come   Encounter Date: 07/16/2016      PT End of Session - 07/16/16 1348    Visit Number 19   Number of Visits 25   Date for PT Re-Evaluation 08/08/16   PT Start Time 1302   PT Stop Time 8366   PT Time Calculation (min) 45 min   Activity Tolerance Patient tolerated treatment well   Behavior During Therapy [facility] for tasks assessed/performed      Past Medical History:  Diagnosis Date  . Arthritis   . Breast cancer ([place]) 1999   right breast, Adriamycin Therapy  . Breast cancer of lower-outer quadrant of left female breast ([place]) 05/07/2016  . Cellulitis   . Chronic acquired lymphedema    rt arm, s/p right breast cancer  . GERD (gastroesophageal reflux disease)   . Headache   . 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 History of hiatal hernia   . History of kidney stones   . History of migraine   . Hyperlipidemia   . Kidney problem 1969   history of left kidney bleeding  . Osteopenia 02/2015   T score -2.0. Prior DEXA 2010 T score -1.7. FRAX 20%/2.1%. Based upon prior history of Fosamax treatment will hold on treatment now with repeat at 2 year interval for stability  . Strep throat    age 39  . Ventricular ectopy    chronic    Past Surgical History:  Procedure Laterality Date  . BREAST LUMPECTOMY WITH RADIOACTIVE SEED AND SENTINEL LYMPH NODE BIOPSY Left 05/07/2016   Procedure: LEFT BREAST LUMPECTOMY WITH RADIOACTIVE SEED AND LEFT AXILLARY SENTINEL LYMPH NODE BIOPSY;  Surgeon: Fanny Skates, MD;  Location: [facility];  Service: General;  Laterality: Left;  . BREAST SURGERY     Mastectomy with TRAM  . CYSTOGRAM    . FINGER SURGERY Right 01/1999   finger on the hand  . FOOT SURGERY   2004,2015  . FOOT SURGERY Left 07/2005   3 toes  . left thumb  1/06  . right mastectomy  10-27-97   with tram flap  . right thumb  1/05  . TONSILLECTOMY AND ADENOIDECTOMY      There were no vitals filed for this visit.      Subjective Assessment - 07/16/16 1306    Subjective I'm going to cancel my appt tomorrow and try to get back in here Monday. I called [facility] and they say my sleeve has left the manufacturer in [place] but they don't where it is.    Pertinent History right mastectomy with ALND TRAM in 1996 with radiation She has had lymphedema in right arm 2005 and has been wearing a nighttime garment "most of the time"  Now with left breast cancer with lumpectomy with 2 sentinel nodes removed 05/07/2016  pt undergoing radition in a few weeks.    Patient Stated Goals to get arm a little smaller    Currently in Pain? No/denies               LYMPHEDEMA/ONCOLOGY QUESTIONNAIRE - 07/16/16 1308      Right Upper Extremity Lymphedema   15 cm Proximal to Olecranon Process 31.2 cm   10 cm Proximal to Olecranon Process 31.1 cm   Olecranon Process 29.9  cm   15 cm Proximal to Ulnar Styloid Process 28.6 cm   10 cm Proximal to Ulnar Styloid Process 24.6 cm   Just Proximal to Ulnar Styloid Process 16.8 cm   Across Hand at [employer] 18.1 cm   At [place] of 2nd Digit 6.5 cm                  [facility] Adult PT Treatment/Exercise - 07/16/16 0001      Manual Therapy   Manual Lymphatic Drainage (MLD) short neck, superficial and deep abdominals, right inguinal nodes and establishment of right axillo-inguinal anastomosis, Rt UE working proximal to distal (lateral upper arm to fingers) directing fluid towards anastomosis.   Compression Bandaging Biotone applied to Rt UE, thick stockinette from hand to axilla, thin foam applied to back of hand, elastomull to fingers 2-4, artiflex x1 with rectangle piece of peach medi striped foam at lateral forearm inferior to olecranon process,  and 1/4" gray foam at antecubital fossa, then 1-6cm, 1-8cm (doing, 1-10 cm (this in herringbone from wrist to elbow, and 1-12 cm short stretch compression bandage from hand to axilla                        [facility] Goals - 07/02/16 1202      CC Long Term Goal  #1   Title Pt will have 2 cm ( to 15 cm )  reduction of circumference at right arm at ulnar styolid    Baseline 17 cm at eval, 17 at 06/11/2016, 17 at 07/02/2016   Time 4   Period Weeks   Status On-going     CC Long Term Goal  #2   Title Pt will have a 4 cm reduction ( to 24.5 cm ) of right forearm at 10 cm proximal to ulnar styolid    Baseline 28.5 at eval, 26.4 on 06/11/2106, 24.4 at 06/27/2016, 24.4.at 07/02/2016   Time 4   Period Weeks   Status Achieved     CC Long Term Goal  #3   Title Pt will know how to do self manual lymph drainage and compression bandaging with family assist so she can manage at home.    Time 4   Status On-going     CC Long Term Goal  #4   Title pt will have knowledge of how to get assistance for compression garments and compression pumps    Status Achieved            Plan - 07/16/16 1348    Clinical Impression Statement Pts circumference measurements are maintaining well and she will be ready to D/C once her compression garments arrive.    Rehab Potential Excellent   Clinical Impairments Affecting Rehab Potential previous Rt ALND and radiation, scoliosis contributing to decreased shoulder movement; current radiation to Lt chest wall for Lt breast CA   PT Frequency 3x / week   PT Duration 4 weeks   PT Treatment/Interventions ADLs/Self Care Home Management;Patient/family education;Orthotic Fit/Training;DME Instruction;Therapeutic exercise;Manual lymph drainage;Compression bandaging;Taping   PT Next Visit Plan Continue with complete decongestive therapy until sleeve is delivered and pt is able to manage lymphedema at home.    Consulted and Agree with Plan of Care Patient       Patient will benefit from skilled therapeutic intervention in order to improve the following deficits and impairments:  Decreased knowledge of use of DME, Increased edema, Postural dysfunction  Visit Diagnosis: Postmastectomy lymphedema  Disorder of  the skin and subcutaneous tissue related to radiation, unspecified  Abnormal posture     Problem List Patient Active Problem List   Diagnosis Date Noted  . Breast cancer of lower-outer quadrant of left female breast ([place]) 05/07/2016  . Genetic testing 05/21/2015  . Family history of breast cancer   . Breast cancer ([place]) 03/17/2012  . 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 Hyperlipidemia 06/26/2011  . PVC's (premature ventricular contractions) 06/26/2011  . History of breast cancer 02/03/2011    [name], PTA 07/16/2016, 1:49 PM  [place], [place], [address] Phone: [phone]   Fax:  [phone]  Name: Cheryl [name] MRN: [id number] Date of Birth: January 18, 1951

## 2016-07-17 ENCOUNTER — Ambulatory Visit: Payer: Medicare Other | Admitting: Physical Therapy

## 2016-07-17 ENCOUNTER — Ambulatory Visit
Admission: RE | Admit: 2016-07-17 | Discharge: 2016-07-17 | Disposition: A | Discharge status: Home / Self Care | Payer: Medicare Other | Source: Ambulatory Visit | Attending: Radiation Oncology | Admitting: Radiation Oncology

## 2016-07-17 DIAGNOSIS — Z170 Estrogen receptor positive status [ER+]: Secondary | ICD-10-CM | POA: Diagnosis not present

## 2016-07-17 DIAGNOSIS — Z510 Encounter for antineoplastic radiation therapy: Secondary | ICD-10-CM | POA: Diagnosis not present

## 2016-07-17 DIAGNOSIS — C50512 Malignant neoplasm of lower-outer quadrant of left female breast: Secondary | ICD-10-CM | POA: Diagnosis not present

## 2016-07-18 ENCOUNTER — Ambulatory Visit
Admission: RE | Admit: 2016-07-18 | Discharge: 2016-07-18 | Disposition: A | Discharge status: Home / Self Care | Payer: Medicare Other | Source: Ambulatory Visit | Attending: Radiation Oncology | Admitting: Radiation Oncology

## 2016-07-18 DIAGNOSIS — Z170 Estrogen receptor positive status [ER+]: Secondary | ICD-10-CM | POA: Diagnosis not present

## 2016-07-18 DIAGNOSIS — C50512 Malignant neoplasm of lower-outer quadrant of left female breast: Secondary | ICD-10-CM | POA: Diagnosis not present

## 2016-07-18 DIAGNOSIS — Z510 Encounter for antineoplastic radiation therapy: Secondary | ICD-10-CM | POA: Diagnosis not present

## 2016-07-21 ENCOUNTER — Ambulatory Visit: Payer: Medicare Other | Attending: Radiation Oncology | Admitting: Physical Therapy

## 2016-07-21 ENCOUNTER — Ambulatory Visit
Admission: RE | Admit: 2016-07-21 | Discharge: 2016-07-21 | Disposition: A | Discharge status: Home / Self Care | Payer: Medicare Other | Source: Ambulatory Visit | Attending: Radiation Oncology | Admitting: Radiation Oncology

## 2016-07-21 DIAGNOSIS — I972 Postmastectomy lymphedema syndrome: Secondary | ICD-10-CM | POA: Diagnosis not present

## 2016-07-21 DIAGNOSIS — Z510 Encounter for antineoplastic radiation therapy: Secondary | ICD-10-CM | POA: Diagnosis not present

## 2016-07-21 DIAGNOSIS — C50512 Malignant neoplasm of lower-outer quadrant of left female breast: Secondary | ICD-10-CM | POA: Diagnosis not present

## 2016-07-21 DIAGNOSIS — L599 Disorder of the skin and subcutaneous tissue related to radiation, unspecified: Secondary | ICD-10-CM | POA: Insufficient documentation

## 2016-07-21 DIAGNOSIS — Z170 Estrogen receptor positive status [ER+]: Secondary | ICD-10-CM | POA: Diagnosis not present

## 2016-07-21 NOTE — Therapy (Signed)
[place], [place], [address] Phone: [phone]   Fax:  [phone]  Physical Therapy Treatment  Patient Details  Name: Cheryl [name] MRN: [id number] Date of Birth: 05/21/1950 Referring Provider: Dr. Sondra Come   Encounter Date: 07/21/2016      PT End of Session - 07/21/16 1700    Visit Number 20   Number of Visits 25   Date for PT Re-Evaluation 08/08/16   PT Start Time 1610   PT Stop Time 1655   PT Time Calculation (min) 45 min   Activity Tolerance Patient tolerated treatment well   Behavior During Therapy [facility] for tasks assessed/performed      Past Medical History:  Diagnosis Date  . Arthritis   . Breast cancer ([place]) 1999   right breast, Adriamycin Therapy  . Breast cancer of lower-outer quadrant of left female breast ([place]) 05/07/2016  . Cellulitis   . Chronic acquired lymphedema    rt arm, s/p right breast cancer  . GERD (gastroesophageal reflux disease)   . Headache   . 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 History of hiatal hernia   . History of kidney stones   . History of migraine   . Hyperlipidemia   . Kidney problem 1969   history of left kidney bleeding  . Osteopenia 02/2015   T score -2.0. Prior DEXA 2010 T score -1.7. FRAX 20%/2.1%. Based upon prior history of Fosamax treatment will hold on treatment now with repeat at 2 year interval for stability  . Strep throat    age 28  . Ventricular ectopy    chronic    Past Surgical History:  Procedure Laterality Date  . BREAST LUMPECTOMY WITH RADIOACTIVE SEED AND SENTINEL LYMPH NODE BIOPSY Left 05/07/2016   Procedure: LEFT BREAST LUMPECTOMY WITH RADIOACTIVE SEED AND LEFT AXILLARY SENTINEL LYMPH NODE BIOPSY;  Surgeon: Fanny Skates, MD;  Location: [facility];  Service: General;  Laterality: Left;  . BREAST SURGERY     Mastectomy with TRAM  . CYSTOGRAM    . FINGER SURGERY Right 01/1999   finger on the hand  . FOOT SURGERY  2004,2015   . FOOT SURGERY Left 07/2005   3 toes  . left thumb  1/06  . right mastectomy  10-27-97   with tram flap  . right thumb  1/05  . TONSILLECTOMY AND ADENOIDECTOMY      There were no vitals filed for this visit.      Subjective Assessment - 07/21/16 1611    Subjective Got a call that the sleeve had come in after radiation today.  Has an appointment to go in tomorrow to have [name] fit.   Currently in Pain? No/denies               LYMPHEDEMA/ONCOLOGY QUESTIONNAIRE - 07/21/16 1614      Right Upper Extremity Lymphedema   15 cm Proximal to Olecranon Process 31.7 cm   10 cm Proximal to Olecranon Process 31.4 cm   Olecranon Process 29.3 cm   15 cm Proximal to Ulnar Styloid Process 30 cm   10 cm Proximal to Ulnar Styloid Process 26.3 cm   Just Proximal to Ulnar Styloid Process 18.9 cm   Across Hand at [employer] 19.2 cm   At [place] of 2nd Digit 6.8 cm                  [facility] Adult PT Treatment/Exercise - 07/21/16 0001  Manual Therapy   Manual Lymphatic Drainage (MLD) short neck, superficial and deep abdominals, right inguinal nodes and establishment of right axillo-inguinal anastomosis, Rt UE working proximal to distal (lateral upper arm to fingers) directing fluid towards anastomosis.   Compression Bandaging Lotion applied to Rt UE, thick stockinette from hand to axilla, thin foam applied to back of hand, elastomull to fingers 2-4, artiflex x1 with rectangle piece of peach medi striped foam at lateral forearm inferior to olecranon process, and 1/4" gray foam at antecubital fossa, then 1-6cm, 1-8cm (doing, 1-10 cm (this in herringbone from wrist to elbow, and 1-12 cm short stretch compression bandage from hand to axilla                        Long Term Clinic Goals - 07/02/16 1202      CC Long Term Goal  #1   Title Pt will have 2 cm ( to 15 cm )  reduction of circumference at right arm at ulnar styolid    Baseline 17 cm at eval, 17 at 06/11/2016,  17 at 07/02/2016   Time 4   Period Weeks   Status On-going     CC Long Term Goal  #2   Title Pt will have a 4 cm reduction ( to 24.5 cm ) of right forearm at 10 cm proximal to ulnar styolid    Baseline 28.5 at eval, 26.4 on 06/11/2106, 24.4 at 06/27/2016, 24.4.at 07/02/2016   Time 4   Period Weeks   Status Achieved     CC Long Term Goal  #3   Title Pt will know how to do self manual lymph drainage and compression bandaging with family assist so she can manage at home.    Time 4   Status On-going     CC Long Term Goal  #4   Title pt will have knowledge of how to get assistance for compression garments and compression pumps    Status Achieved            Plan - 2016-08-18 1700    Clinical Impression Statement Pt. hasn't been in in five days, and right arm circumference measurements are up significantly--a centimeter or more at most levels. She finally got a call that her compression garments have come in, and she is hopeful that she will be fitted tomorrow.   Rehab Potential Excellent   Clinical Impairments Affecting Rehab Potential previous Rt ALND and radiation, scoliosis contributing to decreased shoulder movement; current radiation to Lt chest wall for Lt breast CA   PT Frequency 3x / week   PT Duration 4 weeks   PT Treatment/Interventions ADLs/Self Care Home Management;Patient/family education;Orthotic Fit/Training;DME Instruction;Therapeutic exercise;Manual lymph drainage;Compression bandaging;Taping   PT Next Visit Plan Continue with complete decongestive therapy until sleeve is delivered and pt is able to manage lymphedema at home.    Consulted and Agree with Plan of Care Patient      Patient will benefit from skilled therapeutic intervention in order to improve the following deficits and impairments:  Decreased knowledge of use of DME, Increased edema, Postural dysfunction  Visit Diagnosis: Postmastectomy lymphedema       G-Codes - 08-18-16 1706    Functional Assessment  Tool Used (Outpatient Only) per clinical judgement   Functional Limitation Self care   Self Care Current Status (O7121) At least 1 percent but less than 20 percent impaired, limited or restricted   Self Care Goal Status (F7588) At least 1 percent but  less than 20 percent impaired, limited or restricted      Problem List Patient Active Problem List   Diagnosis Date Noted  . Breast cancer of lower-outer quadrant of left female breast ([place]) 05/07/2016  . Genetic testing 05/21/2015  . Family history of breast cancer   . Breast cancer ([place]) 03/17/2012  . 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 Hyperlipidemia 06/26/2011  . PVC's (premature ventricular contractions) 06/26/2011  . History of breast cancer 02/03/2011    Terisa Belardo 07/21/2016, 5:07 PM  [place], [place], [address] Phone: [phone]   Fax:  [phone]  Name: Marlyss Cissell Karan MRN: [id number] Date of Birth: July 04, 1950  Serafina Royals, PT 07/21/16 5:07 PM

## 2016-07-22 ENCOUNTER — Encounter: Payer: Self-pay | Admitting: Radiation Oncology

## 2016-07-22 ENCOUNTER — Ambulatory Visit
Admission: RE | Admit: 2016-07-22 | Discharge: 2016-07-22 | Disposition: A | Discharge status: Home / Self Care | Payer: Medicare Other | Source: Ambulatory Visit | Attending: Radiation Oncology | Admitting: Radiation Oncology

## 2016-07-22 VITALS — BP 151/76 | HR 60 | Temp 98.7°F | Ht 60.0 in | Wt 139.8 lb

## 2016-07-22 DIAGNOSIS — C50512 Malignant neoplasm of lower-outer quadrant of left female breast: Secondary | ICD-10-CM | POA: Diagnosis not present

## 2016-07-22 DIAGNOSIS — Z170 Estrogen receptor positive status [ER+]: Secondary | ICD-10-CM | POA: Diagnosis not present

## 2016-07-22 DIAGNOSIS — Z510 Encounter for antineoplastic radiation therapy: Secondary | ICD-10-CM | POA: Diagnosis not present

## 2016-07-22 NOTE — Progress Notes (Signed)
  Radiation Oncology         [phone] ________________________________  Name: Cheryl [name] MRN: [id number]  Date: 07/22/2016  DOB: 1950/08/25  Weekly Radiation Therapy Management    ICD-9-CM ICD-10-CM   1. Malignant neoplasm of lower-outer quadrant of left breast of female, estrogen receptor positive (HCC) 174.5 C50.512    V86.0 Z17.0      Current Dose: 52.4 Gy     Planned Dose:  60.4 Gy  Narrative . . . . . . . . The patient presents for routine under treatment assessment.                                    Cheryl [name] has completed 29 fractions to her left breast.  She denies having pain, but does report having some discomfort in her left nipple.  She reports having fatigue.  She is using radiaplex and hydrogel pads.  She has been given a refill of hydrogel pads.  The nurse notes skin on her left breast is red.                                  Set-up films were reviewed.                                 The chart was checked. Physical Findings. . .  height is 5' (1.524 m) and weight is 139 lb 12.8 oz (63.4 kg). Her oral temperature is 98.7 F (37.1 C). Her blood pressure is 151/76 (abnormal) and her pulse is 60. Her oxygen saturation is 100%.  Lungs are clear to auscultation bilaterally. Heart has regular rate and rhythm. RIGHT arm and fingers tightly bound with lymphedema wrap. Radiation dermatitis in the upper inner aspect of the left breast. Brisk erythema throughout the remainder of the breast, more so in the IM fold area. No moist desquamation. Impression . . . . . . . The patient is tolerating radiation. Plan . . . . . . . . . . . . Continue treatment as planned. ________________________________   Blair Promise, PhD, MD  This document serves as a record of services personally performed by Gery Pray, MD. It was created on his behalf by Darcus Austin, a trained medical scribe. The creation of this record is based on the scribe's personal observations and the provider's  statements to them. This document has been checked and approved by the attending provider.

## 2016-07-22 NOTE — Progress Notes (Signed)
Laquilla Kyne has completed 29 fractions to her left breast.  She denies having pain but does report having some discomfort in her left nipple.  She reports having fatigue.  She is using radiaplex and hydrogel pads.  She has been given a refill of hydrogel pads.  The skin on her left breast is red.  BP (!) 151/76 (BP Location: Left Arm, Patient Position: Sitting)   Pulse 60   Temp 98.7 F (37.1 C) (Oral)   Ht 5' (1.524 m)   Wt 139 lb 12.8 oz (63.4 kg)   SpO2 100%   BMI 27.30 kg/m    Wt Readings from Last 3 Encounters:  07/22/16 139 lb 12.8 oz (63.4 kg)  07/15/16 139 lb (63 kg)  07/08/16 139 lb 6.4 oz (63.2 kg)

## 2016-07-23 ENCOUNTER — Ambulatory Visit
Admission: RE | Admit: 2016-07-23 | Discharge: 2016-07-23 | Disposition: A | Discharge status: Home / Self Care | Payer: Medicare Other | Source: Ambulatory Visit | Attending: Radiation Oncology | Admitting: Radiation Oncology

## 2016-07-23 ENCOUNTER — Ambulatory Visit: Payer: Medicare Other

## 2016-07-23 DIAGNOSIS — L599 Disorder of the skin and subcutaneous tissue related to radiation, unspecified: Secondary | ICD-10-CM | POA: Diagnosis not present

## 2016-07-23 DIAGNOSIS — Z170 Estrogen receptor positive status [ER+]: Secondary | ICD-10-CM | POA: Diagnosis not present

## 2016-07-23 DIAGNOSIS — I972 Postmastectomy lymphedema syndrome: Secondary | ICD-10-CM | POA: Diagnosis not present

## 2016-07-23 DIAGNOSIS — Z510 Encounter for antineoplastic radiation therapy: Secondary | ICD-10-CM | POA: Diagnosis not present

## 2016-07-23 DIAGNOSIS — C50512 Malignant neoplasm of lower-outer quadrant of left female breast: Secondary | ICD-10-CM | POA: Diagnosis not present

## 2016-07-23 NOTE — Therapy (Signed)
[place], [place], [address] Phone: [phone]   Fax:  [phone]  Physical Therapy Treatment  Patient Details  Name: Cheryl [name] MRN: [id number] Date of Birth: 21-Jul-1950 Referring Provider: Dr. Sondra Come   Encounter Date: 07/23/2016      PT End of Session - 07/23/16 1351    Visit Number 21   Number of Visits 25   Date for PT Re-Evaluation 08/08/16   PT Start Time 1301   PT Stop Time 1349   PT Time Calculation (min) 48 min   Activity Tolerance Patient tolerated treatment well   Behavior During Therapy [facility] for tasks assessed/performed      Past Medical History:  Diagnosis Date  . Arthritis   . Breast cancer ([place]) 1999   right breast, Adriamycin Therapy  . Breast cancer of lower-outer quadrant of left female breast ([place]) 05/07/2016  . Cellulitis   . Chronic acquired lymphedema    rt arm, s/p right breast cancer  . GERD (gastroesophageal reflux disease)   . Headache   . 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 History of hiatal hernia   . History of kidney stones   . History of migraine   . Hyperlipidemia   . Kidney problem 1969   history of left kidney bleeding  . Osteopenia 02/2015   T score -2.0. Prior DEXA 2010 T score -1.7. FRAX 20%/2.1%. Based upon prior history of Fosamax treatment will hold on treatment now with repeat at 2 year interval for stability  . Strep throat    age 66  . Ventricular ectopy    chronic    Past Surgical History:  Procedure Laterality Date  . BREAST LUMPECTOMY WITH RADIOACTIVE SEED AND SENTINEL LYMPH NODE BIOPSY Left 05/07/2016   Procedure: LEFT BREAST LUMPECTOMY WITH RADIOACTIVE SEED AND LEFT AXILLARY SENTINEL LYMPH NODE BIOPSY;  Surgeon: Fanny Skates, MD;  Location: [facility];  Service: General;  Laterality: Left;  . BREAST SURGERY     Mastectomy with TRAM  . CYSTOGRAM    . FINGER SURGERY Right 01/1999   finger on the hand  . FOOT SURGERY  2004,2015   . FOOT SURGERY Left 07/2005   3 toes  . left thumb  1/06  . right mastectomy  10-27-97   with tram flap  . right thumb  1/05  . TONSILLECTOMY AND ADENOIDECTOMY      There were no vitals filed for this visit.      Subjective Assessment - 07/23/16 1312    Subjective I got my sleeve and gauntlet and I love it! Feels much better than the bandages.    Pertinent History right mastectomy with ALND TRAM in 1996 with radiation She has had lymphedema in right arm 2005 and has been wearing a nighttime garment "most of the time"  Now with left breast cancer with lumpectomy with 2 sentinel nodes removed 05/07/2016  pt undergoing radition in a few weeks.    Patient Stated Goals to get arm a little smaller    Currently in Pain? No/denies               LYMPHEDEMA/ONCOLOGY QUESTIONNAIRE - 07/23/16 1313      Right Upper Extremity Lymphedema   15 cm Proximal to Olecranon Process 30.9 cm   10 cm Proximal to Olecranon Process 31.6 cm   Olecranon Process 29.4 cm   15 cm Proximal to Ulnar Styloid Process 29.5 cm   10 cm Proximal to Ulnar Styloid  Process 26 cm   Just Proximal to Ulnar Styloid Process 16.5 cm   Across Hand at Thumb Web Space 18.4 cm   At Base of 2nd Digit 6.6 cm                  [facility] Adult PT Treatment/Exercise - 07/23/16 0001      Manual Therapy   Manual therapy comments Pt came in with new compression sleeve and gauntelt. Other than pt having it on slightly wrong on the arm (seem was on medial aspect, not lateral) pt reports it feels good and is comfortable. Once properly donned sleeve on pts arm it appears to be a good fit.    Manual Lymphatic Drainage (MLD) short neck, superficial and deep abdominals, right inguinal nodes and establishment of right axillo-inguinal anastomosis, Rt UE working proximal to distal (lateral upper arm to fingers) directing fluid towards anastomosis.                        Long Term Clinic Goals - 07/23/16 1400      CC  Long Term Goal  #1   Title Pt will have 2 cm ( to 15 cm )  reduction of circumference at right arm at ulnar styolid    Baseline 17 cm at eval, 17 at 06/11/2016, 17 at 07/02/2016; 16.5 cm (pt overall has attained good reductions here that have fluctuated over the weeks but should maintain now that she has her daytime compression garments)-07/23/16    Status Partially Met     CC Long Term Goal  #2   Title Pt will have a 4 cm reduction ( to 24.5 cm ) of right forearm at 10 cm proximal to ulnar styolid    Baseline 28.5 at eval, 26.4 on 06/11/2106, 24.4 at 06/27/2016, 24.4.at 07/02/2016; 26 cm (pt overall has attained good reductions here that have fluctuated over the weeks but should maintain now that she has here daytime compression garments)-07/23/16   Status Partially Met     CC Long Term Goal  #3   Title Pt will know how to do self manual lymph drainage and compression bandaging with family assist so she can manage at home.    Baseline Pt mostly uses compression pump and wears her compression garments now in place of bandages.    Status Achieved     CC Long Term Goal  #4   Title pt will have knowledge of how to get assistance for compression garments and compression pumps    Status Achieved            Plan - 07/23/16 1351    Clinical Impression Statement Pts circumference measurements have mostly reduced since last visit 2 days ago. Pt received her new flat knit compression sleeve and gauntlet yesterday and worn them all day yesterday and again today and she reports they are very comfortable and much easier to wear than the bandages. She says she was told to wear sleeve with seam on medial aspect on arm but upon inspection determined that the seam should be on lateral aspect of arm. When donned in that fashion the sleeve, which was coming up a little short of her axilla, now goes up the almost full length of her arm to axilla. Pt verbalized understanding this and will be wearing it as such. She will  cont to use her nighttime garment and the compression pump. Pt is very pleased with her progress and is ready to D/C   at this time.    Rehab Potential Excellent   Clinical Impairments Affecting Rehab Potential previous Rt ALND and radiation, scoliosis contributing to decreased shoulder movement; current radiation to Lt chest wall for Lt breast CA   PT Frequency 3x / week   PT Duration 4 weeks   PT Treatment/Interventions ADLs/Self Care Home Management;Patient/family education;Orthotic Fit/Training;DME Instruction;Therapeutic exercise;Manual lymph drainage;Compression bandaging;Taping   PT Next Visit Plan D/C this visit.    PT Home Exercise Plan Pt to cont to wear daytime compression sleeve/gauntlet, nighttime garment and use compression pump daily.   Consulted and Agree with Plan of Care Patient      Patient will benefit from skilled therapeutic intervention in order to improve the following deficits and impairments:  Decreased knowledge of use of DME, Increased edema, Postural dysfunction  Visit Diagnosis: Postmastectomy lymphedema  Disorder of the skin and subcutaneous tissue related to radiation, unspecified     Problem List Patient Active Problem List   Diagnosis Date Noted  . Breast cancer of lower-outer quadrant of left female breast ([facility]) 05/07/2016  . Genetic testing 05/21/2015  . Family history of breast cancer   . Breast cancer ([facility]) 03/17/2012  . 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 Hyperlipidemia 06/26/2011  . PVC's (premature ventricular contractions) 06/26/2011  . History of breast cancer 02/03/2011    PHYSICAL THERAPY DISCHARGE SUMMARY  Visits from Start of Care: 21  Current functional level related to goals / functional outcomes: As above, pt has reduced and now has a compression pump and new flat knit compression sleeve and gauntlet   Remaining deficits: Lymphedema of right arm    Education / Equipment: Home management of arm lymphedema   Plan: Patient agrees  to discharge.  Patient goals were partially met. Patient is being discharged due to not returning since the last visit.  ?????      ,  Ann, PTA 07/23/2016, 2:05 PM  [facility] [address] [place], [place], [address] Phone: [phone]   Fax:  [phone]  Name: Nasreen A Housman MRN: [id number] Date of Birth: 08/30/1950  Teresa Brown, PT 07/24/16 10:11 AM  

## 2016-07-24 ENCOUNTER — Ambulatory Visit
Admission: RE | Admit: 2016-07-24 | Discharge: 2016-07-24 | Disposition: A | Discharge status: Home / Self Care | Payer: Medicare Other | Source: Ambulatory Visit | Attending: Radiation Oncology | Admitting: Radiation Oncology

## 2016-07-24 DIAGNOSIS — C50512 Malignant neoplasm of lower-outer quadrant of left female breast: Secondary | ICD-10-CM | POA: Diagnosis not present

## 2016-07-24 DIAGNOSIS — Z510 Encounter for antineoplastic radiation therapy: Secondary | ICD-10-CM | POA: Diagnosis not present

## 2016-07-24 DIAGNOSIS — Z170 Estrogen receptor positive status [ER+]: Secondary | ICD-10-CM | POA: Diagnosis not present

## 2016-07-25 ENCOUNTER — Ambulatory Visit
Admission: RE | Admit: 2016-07-25 | Discharge: 2016-07-25 | Disposition: A | Discharge status: Home / Self Care | Payer: Medicare Other | Source: Ambulatory Visit | Attending: Radiation Oncology | Admitting: Radiation Oncology

## 2016-07-25 DIAGNOSIS — Z510 Encounter for antineoplastic radiation therapy: Secondary | ICD-10-CM | POA: Diagnosis not present

## 2016-07-25 DIAGNOSIS — C50512 Malignant neoplasm of lower-outer quadrant of left female breast: Secondary | ICD-10-CM | POA: Diagnosis not present

## 2016-07-25 DIAGNOSIS — Z170 Estrogen receptor positive status [ER+]: Secondary | ICD-10-CM | POA: Diagnosis not present

## 2016-07-28 ENCOUNTER — Ambulatory Visit
Admission: RE | Admit: 2016-07-28 | Discharge: 2016-07-28 | Disposition: A | Discharge status: Home / Self Care | Payer: Medicare Other | Source: Ambulatory Visit | Attending: Radiation Oncology | Admitting: Radiation Oncology

## 2016-07-28 ENCOUNTER — Encounter: Payer: Self-pay | Admitting: Radiation Oncology

## 2016-07-28 VITALS — BP 117/71 | HR 65 | Temp 98.0°F | Ht 60.0 in | Wt 138.6 lb

## 2016-07-28 DIAGNOSIS — C50512 Malignant neoplasm of lower-outer quadrant of left female breast: Secondary | ICD-10-CM

## 2016-07-28 DIAGNOSIS — Z170 Estrogen receptor positive status [ER+]: Secondary | ICD-10-CM | POA: Diagnosis not present

## 2016-07-28 DIAGNOSIS — Z510 Encounter for antineoplastic radiation therapy: Secondary | ICD-10-CM | POA: Diagnosis not present

## 2016-07-28 NOTE — Progress Notes (Signed)
Akaya has completed treatment with 33 fractions to her left breast.  She denies having pain.  She is using radiaplex gel and hydrogel pads.  She denies having fatigue.  She has been given a one month follow up appointment.  The skin on her left breast is red with hyperpigment under the breast.  BP 117/71 (BP Location: Left Arm, Patient Position: Sitting)   Pulse 65   Temp 98 F (36.7 C) (Oral)   Ht 5' (1.524 m)   Wt 138 lb 9.6 oz (62.9 kg)   SpO2 100%   BMI 27.07 kg/m    Wt Readings from Last 3 Encounters:  07/28/16 138 lb 9.6 oz (62.9 kg)  07/22/16 139 lb 12.8 oz (63.4 kg)  07/15/16 139 lb (63 kg)

## 2016-07-28 NOTE — Progress Notes (Signed)
  Radiation Oncology         [phone] ________________________________  Name: Cheryl [name] MRN: [id number]  Date: 07/28/2016  DOB: May 10, 1950  Weekly Radiation Therapy Management    ICD-9-CM ICD-10-CM   1. Malignant neoplasm of lower-outer quadrant of left breast of female, estrogen receptor positive (HCC) 174.5 C50.512    V86.0 Z17.0      Current Dose: 60.4 Gy     Planned Dose:  60.4 Gy  Narrative . . . . . . . . The patient presents for routine under treatment assessment.                                    Timmi has completed treatment with 33 fractions to her left breast. She denies having pain or fatigue. She is using radiaplex gel and hydrogel pads. The nurse notes skin on her left breast is red with hyperpigmentation under the breast.                                  Set-up films were reviewed.                                 The chart was checked. Physical Findings. . .  height is 5' (1.524 m) and weight is 138 lb 9.6 oz (62.9 kg). Her oral temperature is 98 F (36.7 C). Her blood pressure is 117/71 and her pulse is 65. Her oxygen saturation is 100%.  Lungs are clear to auscultation bilaterally. Heart has regular rate and rhythm. RIGHT arm and fingers tightly bound with lymphedema wrap. Erythema and mild hyperpigmentation changes of the left breast. Impression . . . . . . . The patient tolerated radiation. Plan . . . . . . . . . . . . The patient completed treatment as planned and has been given a 1 month follow up appointment card. ________________________________   Blair Promise, PhD, MD  This document serves as a record of services personally performed by Gery Pray, MD. It was created on his behalf by Darcus Austin, a trained medical scribe. The creation of this record is based on the scribe's personal observations and the provider's statements to them. This document has been checked and approved by the attending provider.

## 2016-07-29 NOTE — Progress Notes (Signed)
  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 The patient denied pain and had minimal fatigue during treatment. The patient's left breast had erythema and mild hyperpigmentation changes. The patient's RIGHT arm and fingers were tightly bound with a lymphedema wrap due to prior treatment of the right breast.  Plan: The patient has completed radiation treatment. The patient will return to radiation oncology clinic for routine followup in one month. I advised them to call or return sooner if they have any questions or concerns related to their recovery or treatment.  -----------------------------------  Blair Promise, PhD, MD  This document serves as a record of services personally performed by Gery Pray, MD. It was created on his behalf by Darcus Austin, a trained medical scribe. The creation of this record is based on the scribe's personal observations and the provider's statements to them. This document has been checked and approved by the attending provider.

## 2016-08-14 ENCOUNTER — Telehealth: Payer: Self-pay | Admitting: Medical Oncology

## 2016-08-14 NOTE — Telephone Encounter (Signed)
Faxed mastectomy supply order

## 2016-08-16 NOTE — Progress Notes (Signed)
Cardiology Office Note    Date:  08/18/2016   ID:  Camillo Flaming Markes, DOB 06/13/1950, MRN [id number]  PCP:  Geoffery Lyons, MD  Cardiologist:  Truly Stankiewicz Martinique, MD    History of Present Illness:  Cheryl [name] is a 66 y.o. female seen to establish cardiac care. She is a former patient of Dr. Aundra Dubin and Dr. Doreatha Lew. She has a  history of PVCs and hyperlipidemia.  She had normal stress Echos done in 2009 and 2012. Repeat Echo in 2015 was normal. She has a history of PVCs.   She is s/p mastectomy for breast CA and does have a  history of Adriamycin use.    She has chronic lymphedema in her right arm.  She has only rare palpitations on propranolol which she takes for migraine HAs.   More recently she was diagnosed with left breast CA. She underwent lumpectomy in January and has completed RT. She is now on hormonal therapy.  She has a strong family history of heart disease She reports she is very active walking her dog. Energy level is good. No chest pain or dyspnea. BP has been normal.     Past Medical History:  Diagnosis Date  . Arthritis   . Breast cancer ([place]) 1999   right breast, Adriamycin Therapy  . Breast cancer of lower-outer quadrant of left female breast ([place]) 05/07/2016  . Cellulitis   . Chronic acquired lymphedema    rt arm, s/p right breast cancer  . GERD (gastroesophageal reflux disease)   . Headache   . 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 History of hiatal hernia   . History of kidney stones   . History of migraine   . Hyperlipidemia   . Kidney problem 1969   history of left kidney bleeding  . Osteopenia 02/2015   T score -2.0. Prior DEXA 2010 T score -1.7. FRAX 20%/2.1%. Based upon prior history of Fosamax treatment will hold on treatment now with repeat at 2 year interval for stability  . Strep throat    age 36  . Ventricular ectopy    chronic    Past Surgical History:  Procedure Laterality Date  . BREAST LUMPECTOMY WITH RADIOACTIVE SEED AND SENTINEL  LYMPH NODE BIOPSY Left 05/07/2016   Procedure: LEFT BREAST LUMPECTOMY WITH RADIOACTIVE SEED AND LEFT AXILLARY SENTINEL LYMPH NODE BIOPSY;  Surgeon: Fanny Skates, MD;  Location: [facility];  Service: General;  Laterality: Left;  . BREAST SURGERY     Mastectomy with TRAM  . CYSTOGRAM    . FINGER SURGERY Right 01/1999   finger on the hand  . FOOT SURGERY  2004,2015  . FOOT SURGERY Left 07/2005   3 toes  . left thumb  1/06  . right mastectomy  10-27-97   with tram flap  . right thumb  1/05  . TONSILLECTOMY AND ADENOIDECTOMY      Current Medications: Outpatient Medications Prior to Visit  Medication Sig Dispense Refill  . anastrozole (ARIMIDEX) 1 MG tablet Take 1 tablet (1 mg total) by mouth daily. Please give 3 month supply 90 tablet 3  . aspirin EC 81 MG tablet Take 1 tablet (81 mg total) by mouth daily.    . cephALEXin (KEFLEX) 500 MG capsule Take 500 mg by mouth 4 (four) times daily as needed (take as directed when cellulitis flares up). Four times daily for 3 weeks for cellulitis flare up due to lymphedema    . Coenzyme Q10 (COQ10) 100 MG CAPS Take  100 mg by mouth daily.    Marland Kitchen esomeprazole (NEXIUM) 20 MG capsule Take 20 mg by mouth daily before breakfast.     . fexofenadine (ALLEGRA) 180 MG tablet Take 180 mg by mouth daily as needed (for sinus/allergies.).    Marland Kitchen hyaluronate sodium (RADIAPLEXRX) GEL Apply 1 application topically 2 (two) times daily.    . non-metallic deodorant Jethro Poling) MISC Apply 1 application topically daily as needed.    . propranolol (INDERAL LA) 80 MG 24 hr capsule Take 80 mg by mouth daily with breakfast.     . rizatriptan (MAXALT) 10 MG tablet Take 10 mg by mouth as needed. May repeat in 2 hours if needed    . VITAMIN D, ERGOCALCIFEROL, PO Take 2-3 drops by mouth 2 (two) times daily. 3 drops in the morning & 2 drops in the evening    . ezetimibe (ZETIA) 10 MG tablet Take 1 tablet (10 mg total) by mouth daily. 30 tablet 0  . rosuvastatin (CRESTOR) 40 MG tablet Take 1  tablet (40 mg total) by mouth daily. 30 tablet 0   No facility-administered medications prior to visit.      Allergies:   Banthine [methantheline]; Demerol; and Theophyllines   Social History   Social History  . Marital status: Single    Spouse name: N/A  . Number of children: N/A  . Years of education: N/A   Occupational History  . Nurse [employer]   Social History Main Topics  . Smoking status: Never Smoker  . Smokeless tobacco: Never Used  . Alcohol use 0.0 oz/week     Comment: VERY RARE  . Drug use: No  . Sexual activity: No     Comment:  [place]   Other Topics Concern  . None   Social History Narrative  . None     Family History:  The patient's family history includes Breast cancer in her other, other, and other; Breast cancer (age of onset: 88) in her mother; Dementia in her father; Heart attack in her paternal grandfather; Heart disease in her father; Hyperlipidemia in her sister; Hypertension in her father; Prostate cancer in her other; Tuberculosis in her paternal grandmother.   ROS:   Please see the history of present illness.    ROS All other systems reviewed and are negative.   PHYSICAL EXAM:   VS:  BP (!) 142/72 (BP Location: Left Arm)   Pulse 68   Ht 5' (1.524 m)   Wt 137 lb 6.4 oz (62.3 kg)   BMI 26.83 kg/m    GEN: Well nourished, well developed, in no acute distress  HEENT: normal  Neck: no JVD, carotid bruits, or masses Cardiac: RRR; no murmurs, rubs, or gallops,no edema  Respiratory: Clear to auscultation bilaterally, normal work of breathing Chest s/p mastectomy on right. GI: soft, nontender, nondistended, + BS MS: no deformity or atrophy, chronic lymphedema right arm- she is wearing a sleeve. Skin: warm and dry, no rash Neuro:  Alert and Oriented x 3, Strength and sensation are intact Psych: euthymic mood, full affect  Wt Readings from Last 3 Encounters:  08/18/16 137 lb 6.4 oz (62.3 kg)  07/28/16 138 lb 9.6 oz (62.9 kg)  07/22/16 139  lb 12.8 oz (63.4 kg)      Studies/Labs Reviewed:   EKG:  EKG is not  ordered today.  The ekg ordered 04/29/16  demonstrates NSR with LAD and LVH. I have personally reviewed and interpreted this study.   Recent Labs: 04/29/2016: ALT 28;  BUN 13; Creatinine, Ser 0.69; Hemoglobin 14.5; Platelets 187; Potassium 4.1; Sodium 141   Lipid Panel    Component Value Date/Time   CHOL 168 04/18/2014 0852   TRIG 132.0 04/18/2014 0852   HDL 62.60 04/18/2014 0852   CHOLHDL 3 04/18/2014 0852   VLDL 26.4 04/18/2014 0852   LDLCALC 79 04/18/2014 0852   Labs dated 08/18/15: cholesterol 193, triglycerides 118, HDL 59, LDL 110. TSH normal  Additional studies/ records that were reviewed today include:  Echo 11/21/13; Study Conclusions  - Left ventricle: The cavity size was normal. Systolic function was normal. The estimated ejection fraction was in the range of 55% to 60%. Wall motion was normal; there were no regional wall motion abnormalities. Doppler parameters are consistent with abnormal left ventricular relaxation (grade 1 diastolic dysfunction). Global lateral strain -21.0% - Aortic valve: There was mild regurgitation.  ASSESSMENT:    1. Pure hypercholesterolemia   2. PVC's (premature ventricular contractions)   3. History of breast cancer   4. S/P chemotherapy, time since greater than 12 weeks      PLAN:  In order of problems listed above:  1. She is on appropriate therapy with Crestor and Zetia. Labs followed by Dr. Reynaldo Minium and she is due for repeat labs next month. Rx refilled. 2. Asymptomatic 3. Patient has a history of Breast CA and is s/p chemoRx. She has also had RT x 2. Recommend follow up Echo at this time. She is asymptomatic.   Follow up in one year.     Medication Adjustments/Labs and Tests Ordered: Current medicines are reviewed at length with the patient today.  Concerns regarding medicines are outlined above.  Medication changes, Labs and Tests ordered today  are listed in the Patient Instructions below. Patient Instructions  Continue your current therapy  I will schedule you for an Echocardiogram  I will see you in one year.       Signed, Sarika Baldini Martinique, MD  08/18/2016 11:26 AM    [facility] [address] [place], [place], [address] [phone]

## 2016-08-18 ENCOUNTER — Encounter: Payer: Self-pay | Admitting: Cardiology

## 2016-08-18 ENCOUNTER — Ambulatory Visit (INDEPENDENT_AMBULATORY_CARE_PROVIDER_SITE_OTHER): Payer: Medicare Other | Admitting: Cardiology

## 2016-08-18 VITALS — BP 142/72 | HR 68 | Ht 60.0 in | Wt 137.4 lb

## 2016-08-18 DIAGNOSIS — E7800 Pure hypercholesterolemia, unspecified: Secondary | ICD-10-CM

## 2016-08-18 DIAGNOSIS — Z9221 Personal history of antineoplastic chemotherapy: Secondary | ICD-10-CM | POA: Diagnosis not present

## 2016-08-18 DIAGNOSIS — I493 Ventricular premature depolarization: Secondary | ICD-10-CM | POA: Diagnosis not present

## 2016-08-18 DIAGNOSIS — Z853 Personal history of malignant neoplasm of breast: Secondary | ICD-10-CM | POA: Diagnosis not present

## 2016-08-18 MED ORDER — EZETIMIBE 10 MG PO TABS: 10.0000 mg | ORAL_TABLET | Freq: Every day | ORAL | 3 refills | Status: DC

## 2016-08-18 MED ORDER — ROSUVASTATIN CALCIUM 40 MG PO TABS: 40.0000 mg | ORAL_TABLET | Freq: Every day | ORAL | 3 refills | Status: DC

## 2016-08-18 MED FILL — EZETIMIBE 10 MG TAB: 10 | 30 days supply | Qty: 30 | Fill #0

## 2016-08-18 MED FILL — ROSUVASTATIN CALCIUM 40 MG: 40 | 30 days supply | Qty: 30 | Fill #0

## 2016-08-18 NOTE — Patient Instructions (Signed)
Continue your current therapy  I will schedule you for an Echocardiogram  I will see you in one year.

## 2016-09-01 ENCOUNTER — Other Ambulatory Visit: Payer: Self-pay

## 2016-09-01 ENCOUNTER — Ambulatory Visit (HOSPITAL_COMMUNITY): Payer: Medicare Other | Attending: Cardiology

## 2016-09-01 DIAGNOSIS — E7800 Pure hypercholesterolemia, unspecified: Secondary | ICD-10-CM

## 2016-09-01 DIAGNOSIS — I493 Ventricular premature depolarization: Secondary | ICD-10-CM

## 2016-09-01 DIAGNOSIS — Z9221 Personal history of antineoplastic chemotherapy: Secondary | ICD-10-CM | POA: Diagnosis not present

## 2016-09-01 DIAGNOSIS — Z853 Personal history of malignant neoplasm of breast: Secondary | ICD-10-CM | POA: Diagnosis not present

## 2016-09-01 DIAGNOSIS — E853 Secondary systemic amyloidosis: Secondary | ICD-10-CM | POA: Insufficient documentation

## 2016-09-02 ENCOUNTER — Ambulatory Visit: Payer: Medicare Other | Admitting: Oncology

## 2016-09-03 DIAGNOSIS — E784 Other hyperlipidemia: Secondary | ICD-10-CM | POA: Diagnosis not present

## 2016-09-03 DIAGNOSIS — M859 Disorder of bone density and structure, unspecified: Secondary | ICD-10-CM | POA: Diagnosis not present

## 2016-09-08 ENCOUNTER — Encounter: Payer: Self-pay | Admitting: Oncology

## 2016-09-10 DIAGNOSIS — Z1389 Encounter for screening for other disorder: Secondary | ICD-10-CM | POA: Diagnosis not present

## 2016-09-10 DIAGNOSIS — G43909 Migraine, unspecified, not intractable, without status migrainosus: Secondary | ICD-10-CM | POA: Diagnosis not present

## 2016-09-10 DIAGNOSIS — E784 Other hyperlipidemia: Secondary | ICD-10-CM | POA: Diagnosis not present

## 2016-09-10 DIAGNOSIS — I890 Lymphedema, not elsewhere classified: Secondary | ICD-10-CM | POA: Diagnosis not present

## 2016-09-10 DIAGNOSIS — M1990 Unspecified osteoarthritis, unspecified site: Secondary | ICD-10-CM | POA: Diagnosis not present

## 2016-09-10 DIAGNOSIS — Z6826 Body mass index (BMI) 26.0-26.9, adult: Secondary | ICD-10-CM | POA: Diagnosis not present

## 2016-09-10 DIAGNOSIS — E663 Overweight: Secondary | ICD-10-CM | POA: Diagnosis not present

## 2016-09-10 DIAGNOSIS — M859 Disorder of bone density and structure, unspecified: Secondary | ICD-10-CM | POA: Diagnosis not present

## 2016-09-10 DIAGNOSIS — Z0000 Encounter for general adult medical examination without abnormal findings: Secondary | ICD-10-CM | POA: Diagnosis not present

## 2016-09-10 MED FILL — PROPRANOLOL ER 80 MG CAP: 80 | 90 days supply | Qty: 90 | Fill #0

## 2016-09-11 ENCOUNTER — Ambulatory Visit
Admission: RE | Admit: 2016-09-11 | Discharge: 2016-09-11 | Disposition: A | Discharge status: Home / Self Care | Payer: Medicare Other | Source: Ambulatory Visit | Attending: Radiation Oncology | Admitting: Radiation Oncology

## 2016-09-11 ENCOUNTER — Encounter: Payer: Self-pay | Admitting: Radiation Oncology

## 2016-09-11 DIAGNOSIS — C50512 Malignant neoplasm of lower-outer quadrant of left female breast: Secondary | ICD-10-CM | POA: Insufficient documentation

## 2016-09-11 DIAGNOSIS — Z170 Estrogen receptor positive status [ER+]: Secondary | ICD-10-CM | POA: Diagnosis not present

## 2016-09-11 NOTE — Progress Notes (Signed)
Cheryl [name] is here for follow up.  She denies having pain.  She is taking Arimidex.  She denies having fatigue.  The skin on her left breast is intact.  She has some hyperpigmentation under her left breast.  BP 127/80 (BP Location: Left Arm, Patient Position: Sitting)   Pulse 67   Temp 98.9 F (37.2 C) (Oral)   Ht 5' (1.524 m)   Wt 138 lb 12.8 oz (63 kg)   SpO2 100%   BMI 27.11 kg/m    Wt Readings from Last 3 Encounters:  09/11/16 138 lb 12.8 oz (63 kg)  08/18/16 137 lb 6.4 oz (62.3 kg)  07/28/16 138 lb 9.6 oz (62.9 kg)

## 2016-09-11 NOTE — Progress Notes (Signed)
Radiation Oncology         [phone] ________________________________  Name: Cheryl [name] MRN: [id number]  Date: 09/11/2016  DOB: 01-19-1951  Follow-Up Visit Note  CC: Burnard Bunting, MD  Ladell Pier, MD    ICD-9-CM ICD-10-CM   1. 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  She denies having pain. She is taking Arimidex.  She denies having fatigue.                               ALLERGIES:  is allergic to banthine [methantheline]; demerol; and theophyllines.  Meds: Current Outpatient Prescriptions  Medication Sig Dispense Refill  . anastrozole (ARIMIDEX) 1 MG tablet Take 1 tablet (1 mg total) by mouth daily. Please give 3 month supply 90 tablet 3  . aspirin EC 81 MG tablet Take 1 tablet (81 mg total) by mouth daily.    . cephALEXin (KEFLEX) 500 MG capsule Take 500 mg by mouth 4 (four) times daily as needed (take as directed when cellulitis flares up). Four times daily for 3 weeks for cellulitis flare up due to lymphedema    . Coenzyme Q10 (COQ10) 100 MG CAPS Take 100 mg by mouth daily.    Marland Kitchen esomeprazole (NEXIUM) 20 MG capsule Take 20 mg by mouth daily before breakfast.     . ezetimibe (ZETIA) 10 MG tablet Take 1 tablet (10 mg total) by mouth daily. 90 tablet 3  . fexofenadine (ALLEGRA) 180 MG tablet Take 180 mg by mouth daily as needed (for sinus/allergies.).    Marland Kitchen non-metallic deodorant (ALRA) MISC Apply 1 application topically daily as needed.    . propranolol (INDERAL LA) 80 MG 24 hr capsule Take 80 mg by mouth daily with breakfast.     . rizatriptan (MAXALT) 10 MG tablet Take 10 mg by mouth as needed. May repeat in 2 hours if needed    . rosuvastatin (CRESTOR)  40 MG tablet Take 1 tablet (40 mg total) by mouth daily. 90 tablet 3  . VITAMIN D, ERGOCALCIFEROL, PO Take 2-3 drops by mouth 2 (two) times daily. 3 drops in the morning & 2 drops in the evening     No current facility-administered medications for this encounter.     Physical Findings: The patient is in no acute distress. Patient is alert and oriented.  height is 5' (1.524 m) and weight is 138 lb 12.8 oz (63 kg). Her oral temperature is 98.9 F (37.2 C). Her blood pressure is 127/80 and her pulse is 67. Her oxygen saturation is 100%. .  Lungs are clear to auscultation bilaterally. Heart has regular rate and rhythm. No palpable cervical, supraclavicular, or axillary adenopathy. Abdomen soft, non-tender, normal bowel sounds. No palpable or visible signs of recurrence in right chest area. Scar tissue noted along the right chest area but this does not seem to bother her. Skin well healed and no palpable or visible signs of recurrence in left breast.   Lab Findings: Lab Results  Component Value Date   WBC 6.4 04/29/2016   HGB 14.5 04/29/2016   HCT 41.1 04/29/2016   MCV 90.7 04/29/2016  PLT 187 04/29/2016    Radiographic Findings: No results found.  Impression:  She is doing well at this time without any side effects. No evidence of recurrence on clinical exam today.  Plan:  Patient will follow up with radiation oncology prn. Patient will continue close follow up with Dr. Benay Spice.  -----------------------------------  Blair Promise, PhD, MD  This document serves as a record of services personally performed by Gery Pray, MD. It was created on his behalf by Linward Natal, a trained medical scribe. The creation of this record is based on the scribe's personal observations and the provider's statements to them. This document has been checked and approved by the attending provider.

## 2016-09-23 MED FILL — EZETIMIBE 10 MG TAB: 10 | 30 days supply | Qty: 30 | Fill #1

## 2016-09-23 MED FILL — ROSUVASTATIN CALCIUM 40 MG: 40 | 30 days supply | Qty: 30 | Fill #1

## 2016-10-27 ENCOUNTER — Ambulatory Visit (HOSPITAL_BASED_OUTPATIENT_CLINIC_OR_DEPARTMENT_OTHER): Payer: Medicare Other | Admitting: Oncology

## 2016-10-27 ENCOUNTER — Telehealth: Payer: Self-pay | Admitting: Oncology

## 2016-10-27 VITALS — BP 141/80 | HR 65 | Temp 98.7°F | Resp 17 | Ht 60.0 in | Wt 136.0 lb

## 2016-10-27 DIAGNOSIS — Z79811 Long term (current) use of aromatase inhibitors: Secondary | ICD-10-CM

## 2016-10-27 DIAGNOSIS — M8580 Other specified disorders of bone density and structure, unspecified site: Secondary | ICD-10-CM | POA: Diagnosis not present

## 2016-10-27 DIAGNOSIS — Z170 Estrogen receptor positive status [ER+]: Secondary | ICD-10-CM

## 2016-10-27 DIAGNOSIS — C50512 Malignant neoplasm of lower-outer quadrant of left female breast: Secondary | ICD-10-CM | POA: Diagnosis not present

## 2016-10-27 MED FILL — ROSUVASTATIN CALCIUM 40 MG: 40 | 30 days supply | Qty: 30 | Fill #2

## 2016-10-27 MED FILL — ANASTROZOLE 1 MG TABLET: 1 | 90 days supply | Qty: 90 | Fill #1

## 2016-10-27 MED FILL — EZETIMIBE 10 MG TAB: 10 | 30 days supply | Qty: 30 | Fill #2

## 2016-10-27 NOTE — Progress Notes (Signed)
  [facility] OFFICE PROGRESS NOTE   Diagnosis: Breast cancer  INTERVAL HISTORY:   Cheryl [name] returns as scheduled. She continues Arimidex. She feels well. She uses a lymphedema pump for the right arm in the evening. This helps. No recent infection. No new complaint.  Objective:  Vital signs in last 24 hours:  Blood pressure (!) 141/80, pulse 65, temperature 98.7 F (37.1 C), temperature source Oral, resp. rate 17, height 5' (1.524 m), weight 136 lb (61.7 kg), SpO2 100 %.    HEENT: Neck without mass Lymphatics: No cervical, supraclavicular, or axillary nodes Resp: Lungs clear bilaterally Cardio: Regular rate and rhythm GI: No hepatomegaly Vascular: No leg edema, mild edema of the right arm with a lymphedema sleeve in place Breasts: Status post right mastectomy with a TRAM reconstruction. Firm tissue at the medial aspect of the TRAM. Left breast without mass.  Portacath/PICC-without erythema  Lab Results:  Lab Results  Component Value Date   WBC 6.4 04/29/2016   HGB 14.5 04/29/2016   HCT 41.1 04/29/2016   MCV 90.7 04/29/2016   PLT 187 04/29/2016   NEUTROABS 4.1 04/29/2016    CMP     Component Value Date/Time   NA 141 04/29/2016 0838   K 4.1 04/29/2016 0838   CL 108 04/29/2016 0838   CO2 23 04/29/2016 0838   GLUCOSE 118 (H) 04/29/2016 0838   BUN 13 04/29/2016 0838   CREATININE 0.69 04/29/2016 0838   CALCIUM 10.1 04/29/2016 0838   PROT 7.8 04/29/2016 0838   ALBUMIN 4.4 04/29/2016 0838   AST 34 04/29/2016 0838   ALT 28 04/29/2016 0838   ALKPHOS 77 04/29/2016 0838   BILITOT 0.9 04/29/2016 0838   GFRNONAA >60 04/29/2016 0838   GFRAA >60 04/29/2016 0838    No results found for: CEA1  No results found for: INR  Imaging:  No results found.  Medications: I have reviewed the patient's current medications.  Assessment/Plan: 1.Stage III right-sided breast cancer diagnosed in 1999. She remains in clinical remission. She completed adjuvant  Femara therapy at the end of August 2009.  2. Left renal lesion on CT scan in April 2008 - a renal ultrasound confirmed bilateral renal cysts.  3. Osteopenia - she continues vitamin D.  4. 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  5. BRCA2 variant of unclear clinical significance, with a significant family history of breast cancer. Negative breast/ovarian gene panel 06/15/2015 6. History of hematuria - reports being diagnosed with a renal stone by Dr Diona Fanti.  7. Left breast cancer-invasive lobular carcinoma, HER-2 negative, ER positive, PR positive, Ki-20 percent confirmed on a core biopsy of a left lower outer quadrant mass on 03/10/2016  Left lumpectomy an sentinel lymph node biopsy01/17/2018,1.5 cm lobular carcinoma, grade 2,pT1c,pN0  Oncotype-score 17, low risk  Initiation of adjuvant left breast radiation 06/12/2016, completed 07/28/2016  Initiation of adjuvant Arimidex for 9 2018     Disposition:  She is in clinical remission from breast cancer. She will continue Arimidex. She is scheduled to see Dr. Dalbert Batman next month.  She has been scheduled for an echocardiogram by Dr. Martinique. Cheryl [name] will schedule a bone density scan and mammogram for the fall of this year. She will return for an office visit here in 5-6 months.  Donneta Romberg, MD  10/27/2016  8:41 AM

## 2016-10-27 NOTE — Telephone Encounter (Signed)
Scheduled appt per 7/9 los - Gave patient AVS and calender per los.  

## 2016-11-24 ENCOUNTER — Telehealth: Payer: Self-pay

## 2016-11-24 NOTE — Telephone Encounter (Signed)
Patient said you told her once everything resolved with her cancer situation to call you. She said you had recommended a hysterectomy at that point and she thought you were going to coordinate with Dr. Denman George.  She said she had seen Dr. Denman George last Nov.

## 2016-11-24 NOTE — Telephone Encounter (Signed)
Based on her history and genetic screening the recommended surgery from a prophylactic standpoint would be laparoscopic bilateral salpingo-oophorectomy and not full hysterectomy. It is a big difference as far as was involved with the surgery and recovery. I would recommend office visit to allow me to examine her and discuss this in detail.

## 2016-11-24 NOTE — Telephone Encounter (Signed)
Spoke with patient and advised her. Rosemarie Ax will call her back to schedule appt.

## 2016-12-01 ENCOUNTER — Ambulatory Visit (INDEPENDENT_AMBULATORY_CARE_PROVIDER_SITE_OTHER): Payer: Medicare Other | Admitting: Gynecology

## 2016-12-01 ENCOUNTER — Encounter: Payer: Self-pay | Admitting: Gynecology

## 2016-12-01 VITALS — BP 122/78

## 2016-12-01 DIAGNOSIS — Z1502 Genetic susceptibility to malignant neoplasm of ovary: Secondary | ICD-10-CM | POA: Diagnosis not present

## 2016-12-01 DIAGNOSIS — Z1509 Genetic susceptibility to other malignant neoplasm: Secondary | ICD-10-CM | POA: Diagnosis not present

## 2016-12-01 DIAGNOSIS — C50919 Malignant neoplasm of unspecified site of unspecified female breast: Secondary | ICD-10-CM | POA: Diagnosis not present

## 2016-12-01 DIAGNOSIS — M8580 Other specified disorders of bone density and structure, unspecified site: Secondary | ICD-10-CM

## 2016-12-01 MED FILL — ROSUVASTATIN CALCIUM 40 MG: 40 | 30 days supply | Qty: 30 | Fill #3

## 2016-12-01 MED FILL — EZETIMIBE 10 MG TAB: 10 | 30 days supply | Qty: 30 | Fill #3

## 2016-12-01 NOTE — Progress Notes (Signed)
    Cheryl [name] [birth year] [id number]        66 y.o.  G0P0 presents to discuss possible surgical options. History of bilateral breast cancer with BRCA 2 indeterminate gene mutation. Saw Dr. Denman George in consultation. Had normal CA-125 at 23 and a negative ultrasound where they could not visualize either ovary and had an endometrial echo 4 mm. They reviewed options to include bilateral salpingo-oophorectomy as a risk reductive surgery as well as hysterectomy with bilateral salpingo-oophorectomy.  The reviewed the indeterminant mutation and how that affects her GYN cancer risks. They also discussed there is not a well defined link between BRCA 2 mutations and uterine cancer. The patient ultimately feels very strongly she wants to proceed with hysterectomy and a bilateral salpingo-oophorectomy.  Past medical history,surgical history, problem list, medications, allergies, family history and social history were all reviewed and documented in the EPIC chart.  Directed ROS with pertinent positives and negatives documented in the history of present illness/assessment and plan.  Exam: Cheryl [name] assistant Vitals:   12/01/16 1130  BP: 122/78   General appearance:  Normal Abdomen soft nontender without masses guarding rebound Pelvic external BUS vagina with atrophic changes. Introital opening compromised requiring small speculum. Cervix with atrophic changes. Uterus grossly normal midline mobile nontender. Adnexa without masses or tenderness  Assessment/Plan:  66 y.o. G0P0 with history of bilateral breast cancer, indeterminate BRCA 2 mutation, negative CA-125 and negative GYN ultrasound. Patient wants to proceed with hysterectomy bilateral salpingo-oophorectomy. Given her nulliparous status and compromised introital opening I do not feel I would be able to accomplish this laparoscopically and that I would have to proceed with a TAH/BSO.  I feel she would be an appropriate candidate for robotic hysterectomy  with BSO that may save her the larger incision and longer recovery period. After lengthy discussion as to what's involved with various approaches the patient is interested in proceeding with the robotic approach. As she is comfortable with Dr. Denman George who she saw previously she is going to make an appointment with her to proceed with the robotic hysterectomy bilateral salpingo-oophorectomy.  The patient's last DEXA was 2 years ago. She is on aromatase inhibitor therapy and her oncologist wanted her to repeat her bone density. She'll go ahead and schedule this now in our office as this is where her last bone density was performed.    Anastasio Auerbach MD, 12:28 PM 12/01/2016

## 2016-12-01 NOTE — Patient Instructions (Signed)
Follow up for the bone density as scheduled.  Follow up with Dr. Denman George arrange for surgery.

## 2016-12-10 MED FILL — PROPRANOLOL ER 80 MG CAP: 80 | 90 days supply | Qty: 90 | Fill #1

## 2016-12-11 DIAGNOSIS — Z9011 Acquired absence of right breast and nipple: Secondary | ICD-10-CM | POA: Diagnosis not present

## 2016-12-11 DIAGNOSIS — C50512 Malignant neoplasm of lower-outer quadrant of left female breast: Secondary | ICD-10-CM | POA: Diagnosis not present

## 2016-12-11 DIAGNOSIS — Z803 Family history of malignant neoplasm of breast: Secondary | ICD-10-CM | POA: Diagnosis not present

## 2016-12-11 DIAGNOSIS — I972 Postmastectomy lymphedema syndrome: Secondary | ICD-10-CM | POA: Diagnosis not present

## 2016-12-11 DIAGNOSIS — Z853 Personal history of malignant neoplasm of breast: Secondary | ICD-10-CM | POA: Diagnosis not present

## 2016-12-24 ENCOUNTER — Encounter: Payer: Self-pay | Admitting: Gynecologic Oncology

## 2016-12-24 ENCOUNTER — Ambulatory Visit: Payer: Medicare Other | Attending: Gynecologic Oncology | Admitting: Gynecologic Oncology

## 2016-12-24 ENCOUNTER — Ambulatory Visit: Payer: Medicare Other | Admitting: Gynecologic Oncology

## 2016-12-24 VITALS — BP 125/69 | HR 67 | Temp 97.7°F | Resp 20 | Wt 136.5 lb

## 2016-12-24 DIAGNOSIS — Z1502 Genetic susceptibility to malignant neoplasm of ovary: Secondary | ICD-10-CM

## 2016-12-24 DIAGNOSIS — Z923 Personal history of irradiation: Secondary | ICD-10-CM | POA: Insufficient documentation

## 2016-12-24 DIAGNOSIS — Z803 Family history of malignant neoplasm of breast: Secondary | ICD-10-CM | POA: Diagnosis not present

## 2016-12-24 DIAGNOSIS — Z87442 Personal history of urinary calculi: Secondary | ICD-10-CM | POA: Diagnosis not present

## 2016-12-24 DIAGNOSIS — K219 Gastro-esophageal reflux disease without esophagitis: Secondary | ICD-10-CM | POA: Diagnosis not present

## 2016-12-24 DIAGNOSIS — Z1501 Genetic susceptibility to malignant neoplasm of breast: Secondary | ICD-10-CM | POA: Diagnosis not present

## 2016-12-24 DIAGNOSIS — Z8249 Family history of ischemic heart disease and other diseases of the circulatory system: Secondary | ICD-10-CM | POA: Insufficient documentation

## 2016-12-24 DIAGNOSIS — Z9011 Acquired absence of right breast and nipple: Secondary | ICD-10-CM | POA: Insufficient documentation

## 2016-12-24 DIAGNOSIS — Z79899 Other long term (current) drug therapy: Secondary | ICD-10-CM | POA: Insufficient documentation

## 2016-12-24 DIAGNOSIS — Z853 Personal history of malignant neoplasm of breast: Secondary | ICD-10-CM | POA: Diagnosis not present

## 2016-12-24 DIAGNOSIS — Z9889 Other specified postprocedural states: Secondary | ICD-10-CM | POA: Insufficient documentation

## 2016-12-24 DIAGNOSIS — Z1509 Genetic susceptibility to other malignant neoplasm: Secondary | ICD-10-CM

## 2016-12-24 NOTE — Patient Instructions (Signed)
Preparing for your Surgery  Plan for surgery on Sept 25, 2018 with Dr. Everitt Amber at [facility] will be scheduled for a robotic assisted bilateral salpingo-oophorectomy, lysis of adhesions.  Pre-operative Testing -You will receive a phone call from presurgical testing at [facility] to arrange for a pre-operative testing appointment before your surgery.  This appointment normally occurs one to two weeks before your scheduled surgery.   -Bring your insurance card, copy of an advanced directive if applicable, medication list  -At that visit, you will be asked to sign a consent for a possible blood transfusion in case a transfusion becomes necessary during surgery.  The need for a blood transfusion is rare but having consent is a necessary part of your care.     -You should not be taking blood thinners or aspirin at least ten days prior to surgery unless instructed by your surgeon.  Day Before Surgery at [place] will be asked to take in a light diet the day before surgery.  Avoid carbonated beverages.  You will be advised to have nothing to eat or drink after midnight the evening before.     Eat a light diet the day before surgery.  Examples including soups, broths, toast, yogurt, mashed potatoes.  Things to avoid include carbonated beverages (fizzy beverages), raw fruits and raw vegetables, or beans.    If your bowels are filled with gas, your surgeon will have difficulty visualizing your pelvic organs which increases your surgical risks.  Your role in recovery Your role is to become active as soon as directed by your doctor, while still giving yourself time to heal.  Rest when you feel tired. You will be asked to do the following in order to speed your recovery:  - Cough and breathe deeply. This helps toclear and expand your lungs and can prevent pneumonia. You may be given a spirometer to practice deep breathing. A staff member will show you how to use  the spirometer. - Do mild physical activity. Walking or moving your legs help your circulation and body functions return to normal. A staff member will help you when you try to walk and will provide you with simple exercises. Do not try to get up or walk alone the first time. - Actively manage your pain. Managing your pain lets you move in comfort. We will ask you to rate your pain on a scale of zero to 10. It is your responsibility to tell your doctor or nurse where and how much you hurt so your pain can be treated.  Special Considerations -If you are diabetic, you may be placed on insulin after surgery to have closer control over your blood sugars to promote healing and recovery.  This does not mean that you will be discharged on insulin.  If applicable, your oral antidiabetics will be resumed when you are tolerating a solid diet.  -Your final pathology results from surgery should be available by the Friday after surgery and the results will be relayed to you when available.   Blood Transfusion Information WHAT IS A BLOOD TRANSFUSION? A transfusion is the replacement of blood or some of its parts. Blood is made up of multiple cells which provide different functions.  Red blood cells carry oxygen and are used for blood loss replacement.  White blood cells fight against infection.  Platelets control bleeding.  Plasma helps clot blood.  Other blood products are available for specialized needs, such as hemophilia or other clotting  disorders. BEFORE THE TRANSFUSION  Who gives blood for transfusions?   You may be able to donate blood to be used at a later date on yourself (autologous donation).  Relatives can be asked to donate blood. This is generally not any safer than if you have received blood from a stranger. The same precautions are taken to ensure safety when a relative's blood is donated.  Healthy volunteers who are fully evaluated to make sure their blood is safe. This is blood  bank blood. Transfusion therapy is the safest it has ever been in the practice of medicine. Before blood is taken from a donor, a complete history is taken to make sure that person has no history of diseases nor engages in risky social behavior (examples are intravenous drug use or sexual activity with multiple partners). The donor's travel history is screened to minimize risk of transmitting infections, such as malaria. The donated blood is tested for signs of infectious diseases, such as HIV and hepatitis. The blood is then tested to be sure it is compatible with you in order to minimize the chance of a transfusion reaction. If you or a relative donates blood, this is often done in anticipation of surgery and is not appropriate for emergency situations. It takes many days to process the donated blood. RISKS AND COMPLICATIONS Although transfusion therapy is very safe and saves many lives, the main dangers of transfusion include:   Getting an infectious disease.  Developing a transfusion reaction. This is an allergic reaction to something in the blood you were given. Every precaution is taken to prevent this. The decision to have a blood transfusion has been considered carefully by your caregiver before blood is given. Blood is not given unless the benefits outweigh the risks.

## 2016-12-24 NOTE — Progress Notes (Signed)
Consult Note: Gyn-Onc  Consult was requested by Dr. Fontaine for the evaluation of Cheryl [name] 65 y.o. female  CC:  Chief Complaint  Patient presents with  . BRCA2 positive    Assessment/Plan:  Cheryl [name]  is a 65 y.o.  year old with a history of an indeterminant BRCA 2 mutation and bilateral breast cancer (recently diagnosed and treated left sided lesion).  She is now ready for BSO.  She had originally planned on hysterectomy and BSO. I discussed that hysterectomy could be accomplished but was not medically necessary as part of the BRCA2 mutation (and her risk for endometrial cancer is consistent with population averages). I discussed that hysterectomy is associated with increased surgical risk and recovery. However, given her anxiety about potential uterine cancer, I offered her hysterectomy if she is interested in that. After learning of the increased risks and unclear medical virtue of hysterectomy in her case, she is electing for BSO. Given her history of TRAM flap with mesh, and her apparent large abdominal wall hernia, she may require extensive lysis of adhesions. She requires preop ancef to decrease risk of cellulitis in her right upper extremity.  HPI: Cheryl [name] is a very pleasant 65 year old G0 who is seen in consultation at the request of Dr Fontaine for an indeterminent BRCA2 mutation in the setting of a new left sided breast cancer diagnosis.  The patient was tested in 2009 and the pannet revealed a mutation of [id number] which was considered of uncertain significance.  The patient has a personal history of right breast cancer, a mother with bilateral breast cancer and multiple great aunts with breast cancer. There is no ovarian cancer family history.  The patient is nulliparous. She is generally very healthy. Her only abdominal surgery was a TRAM flap reconstruction in 1999 at [facility] (mesh was used).   Her last abdominal US was normal in 2009.  She is a patient  of Dr Sherrill's for breast cancer management.   Interval Hx: She completed treatment with surgery, radiation and arimidex.  CA 125 in November, 2017 was normal at 25, and US in November, 2017 showed a normal uterus measuring 4.2x2.3x3.4cm and nonvisualized ovaries.  Current Meds:  Outpatient Encounter Prescriptions as of 12/24/2016  Medication Sig  . anastrozole (ARIMIDEX) 1 MG tablet Take 1 tablet (1 mg total) by mouth daily. Please give 3 month supply  . Coenzyme Q10 (COQ10) 100 MG CAPS Take 100 mg by mouth daily.  . esomeprazole (NEXIUM) 20 MG capsule Take 20 mg by mouth daily before breakfast.   . ezetimibe (ZETIA) 10 MG tablet Take 1 tablet (10 mg total) by mouth daily.  . fexofenadine (ALLEGRA) 180 MG tablet Take 180 mg by mouth daily as needed (for sinus/allergies.).  . non-metallic deodorant (ALRA) MISC Apply 1 application topically daily as needed.  . propranolol (INDERAL LA) 80 MG 24 hr capsule Take 80 mg by mouth daily with breakfast.   . rizatriptan (MAXALT) 10 MG tablet Take 10 mg by mouth as needed. May repeat in 2 hours if needed  . rosuvastatin (CRESTOR) 40 MG tablet Take 1 tablet (40 mg total) by mouth daily.  . VITAMIN D, ERGOCALCIFEROL, PO Take 2-3 drops by mouth 2 (two) times daily. 3 drops in the morning & 2 drops in the evening  . cephALEXin (KEFLEX) 500 MG capsule Take 500 mg by mouth 4 (four) times daily as needed. cellulitis flare up.  . [DISCONTINUED] aspirin EC 81 MG tablet Take 1   tablet (81 mg total) by mouth daily.  . [DISCONTINUED] cephALEXin (KEFLEX) 500 MG capsule Take 500 mg by mouth 4 (four) times daily as needed (take as directed when cellulitis flares up). Four times daily for 3 weeks for cellulitis flare up due to lymphedema   No facility-administered encounter medications on file as of 12/24/2016.     Allergy:  Allergies  Allergen Reactions  . Banthine [Methantheline] Anaphylaxis  . Demerol Hives  . Theophyllines Palpitations    Social Hx:    Social History   Social History  . Marital status: Single    Spouse name: N/A  . Number of children: N/A  . Years of education: N/A   Occupational History  . Nurse [employer]   Social History Main Topics  . Smoking status: Never Smoker  . Smokeless tobacco: Never Used  . Alcohol use 0.0 oz/week     Comment: VERY RARE  . Drug use: No  . Sexual activity: No     Comment:  [place]   Other Topics Concern  . Not on file   Social History Narrative  . No narrative on file    Past Surgical Hx:  Past Surgical History:  Procedure Laterality Date  . BREAST LUMPECTOMY WITH RADIOACTIVE SEED AND SENTINEL LYMPH NODE BIOPSY Left 05/07/2016   Procedure: LEFT BREAST LUMPECTOMY WITH RADIOACTIVE SEED AND LEFT AXILLARY SENTINEL LYMPH NODE BIOPSY;  Surgeon: Haywood Ingram, MD;  Location: [facility] OR;  Service: General;  Laterality: Left;  . BREAST SURGERY     Mastectomy with TRAM  . CYSTOGRAM    . FINGER SURGERY Right 01/1999   finger on the hand  . FOOT SURGERY  2004,2015  . FOOT SURGERY Left 07/2005   3 toes  . left thumb  1/06  . right mastectomy  10-27-97   with tram flap  . right thumb  1/05  . TONSILLECTOMY AND ADENOIDECTOMY      Past Medical Hx:  Past Medical History:  Diagnosis Date  . Arthritis   . Breast cancer (HCC) 1999   right breast, Adriamycin Therapy  . Breast cancer of lower-outer quadrant of left female breast (HCC) 05/07/2016  . Cellulitis   . Chronic acquired lymphedema    rt arm, s/p right breast cancer  . GERD (gastroesophageal reflux disease)   . Headache   . 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 History of external beam radiation therapy 06/12/16-07/28/16   left breast 50.4 Gy in 28 fractions, left breast boost 10 Gy in 5 fractions  . History of hiatal hernia   . History of kidney stones   . History of migraine   . Hyperlipidemia   . Kidney problem 1969   history of left kidney bleeding  . Osteopenia 02/2015   T score -2.0. Prior DEXA 2010 T score -1.7.  FRAX 20%/2.1%. Based upon prior history of Fosamax treatment will hold on treatment now with repeat at 2 year interval for stability  . Strep throat    age 8  . Ventricular ectopy    chronic    Past Gynecological History:  G0 No LMP recorded. Patient is postmenopausal.  Family Hx:  Family History  Problem Relation Age of Onset  . Breast cancer Mother 72       dx 3 times, bilateral  . Heart disease Father        CABG  . Hypertension Father   . Dementia Father   . Hyperlipidemia Sister   . Tuberculosis Paternal Grandmother   .   Heart attack Paternal Grandfather   . Breast cancer Other        MGM's sister  . Breast cancer Other        MGMs sister  . Breast cancer Other        MGMs sister  . Prostate cancer Other        MGMs father    Review of Systems:  Constitutional  Feels well,    ENT Normal appearing ears and nares bilaterally Skin/Breast  No rash, sores, jaundice, itching, dryness Cardiovascular  No chest pain, shortness of breath, or edema  Pulmonary  No cough or wheeze.  Gastro Intestinal  No nausea, vomitting, or diarrhoea. No bright red blood per rectum, no abdominal pain, change in bowel movement, or constipation.  Genito Urinary  No frequency, urgency, dysuria, no bleeding Musculo Skeletal  No myalgia, arthralgia, joint swelling or pain  Neurologic  No weakness, numbness, change in gait,  Psychology  No depression, anxiety, insomnia.   Vitals:  Blood pressure 125/69, pulse 67, temperature 97.7 F (36.5 C), temperature source Oral, resp. rate 20, weight 136 lb 8 oz (61.9 kg), SpO2 98 %.  Physical Exam: WD in NAD Neck  Supple NROM, without any enlargements.  Lymph Node Survey No cervical supraclavicular or inguinal adenopathy Cardiovascular  Pulse normal rate, regularity and rhythm. S1 and S2 normal.  Lungs  Clear to auscultation bilateraly, without wheezes/crackles/rhonchi. Good air movement.  Skin  No rash/lesions/breakdown  Psychiatry   Alert and oriented to person, place, and time  Abdomen  Normoactive bowel sounds, abdomen soft, non-tender and obese without evidence of hernia.  Back No CVA tenderness Genito Urinary  Vulva/vagina: Normal external female genitalia.  No lesions. No discharge or bleeding.  Bladder/urethra:  No lesions or masses, well supported bladder  Vagina: narrow introitus.  Cervix: Normal appearing, no lesions.  Uterus: Small, mobile, no parametrial involvement or nodularity.  Adnexa: no palpable masses. Rectal  deferred Extremities  No bilateral cyanosis, clubbing or edema.   Lateesha Bezold Caroline, MD  12/24/2016, 4:06 PM     

## 2016-12-30 MED FILL — ROSUVASTATIN CALCIUM 40 MG: 40 | 30 days supply | Qty: 30 | Fill #4

## 2016-12-30 MED FILL — EZETIMIBE 10 MG TAB: 10 | 30 days supply | Qty: 30 | Fill #4

## 2017-01-05 NOTE — Patient Instructions (Addendum)
Cheryl [name]  01/05/2017   Your procedure is scheduled on: 01/13/17  Report to [facility] Main  Entrance [place]  elevators to 3rd floor to  [place] at      [phone].    Call this number if you have problems the morning of surgery [phone]   Remember: ONLY 1 PERSON MAY GO WITH YOU TO SHORT STAY TO GET  READY MORNING OF YOUR SURGERY.              Eat a light diet the day before surgery examples are:   toast,  Yogurt, soups, broths, and mashed potatoes AVOID:             carbonated beverages raw fruits and veggies and beans   Do not eat food or drink liquids :After Midnight.     Take these medicines the morning of surgery with A SIP OF WATER: propanalol, zetia, nexium, arimidex                                You may not have any metal on your body including hair pins and              piercings  Do not wear jewelry, make-up, lotions, powders or perfumes, deodorant             Do not wear nail polish.  Do not shave  48 hours prior to surgery.         Do not bring valuables to the hospital. [place].  Contacts, dentures or bridgework may not be worn into surgery.      Patients discharged the day of surgery will not be allowed to drive home.  Name and phone number of your driver:  Special Instructions: N/A              Please read over the following fact sheets you were given: _____________________________________________________________________          [facility] - Preparing for Surgery Before surgery, you can play an important role.  Because skin is not sterile, your skin needs to be as free of germs as possible.  You can reduce the number of germs on your skin by washing with CHG (chlorahexidine gluconate) soap before surgery.  CHG is an antiseptic cleaner which kills germs and bonds with the skin to continue killing germs even after washing. Please DO NOT use if you have an allergy to  CHG or antibacterial soaps.  If your skin becomes reddened/irritated stop using the CHG and inform your nurse when you arrive at Short Stay. Do not shave (including legs and underarms) for at least 48 hours prior to the first CHG shower.  You may shave your face/neck. Please follow these instructions carefully:  1.  Shower with CHG Soap the night before surgery and the  morning of Surgery.  2.  If you choose to wash your hair, wash your hair first as usual with your  normal  shampoo.  3.  After you shampoo, rinse your hair and body thoroughly to remove the  shampoo.  4.  Use CHG as you would any other liquid soap.  You can apply chg directly  to the skin and wash                       Gently with a scrungie or clean washcloth.  5.  Apply the CHG Soap to your body ONLY FROM THE NECK DOWN.   Do not use on face/ open                           Wound or open sores. Avoid contact with eyes, ears mouth and genitals (private parts).                       Wash face,  Genitals (private parts) with your normal soap.             6.  Wash thoroughly, paying special attention to the area where your surgery  will be performed.  7.  Thoroughly rinse your body with warm water from the neck down.  8.  DO NOT shower/wash with your normal soap after using and rinsing off  the CHG Soap.                9.  Pat yourself dry with a clean towel.            10.  Wear clean pajamas.            11.  Place clean sheets on your bed the night of your first shower and do not  sleep with pets. Day of Surgery : Do not apply any lotions/deodorants the morning of surgery.  Please wear clean clothes to the hospital/surgery center.  FAILURE TO FOLLOW THESE INSTRUCTIONS MAY RESULT IN THE CANCELLATION OF YOUR SURGERY PATIENT SIGNATURE_________________________________  NURSE SIGNATURE__________________________________  ________________________________________________________________________  WHAT IS A  BLOOD TRANSFUSION? Blood Transfusion Information  A transfusion is the replacement of blood or some of its parts. Blood is made up of multiple cells which provide different functions.  Red blood cells carry oxygen and are used for blood loss replacement.  White blood cells fight against infection.  Platelets control bleeding.  Plasma helps clot blood.  Other blood products are available for specialized needs, such as hemophilia or other clotting disorders. BEFORE THE TRANSFUSION  Who gives blood for transfusions?   Healthy volunteers who are fully evaluated to make sure their blood is safe. This is blood bank blood. Transfusion therapy is the safest it has ever been in the practice of medicine. Before blood is taken from a donor, a complete history is taken to make sure that person has no history of diseases nor engages in risky social behavior (examples are intravenous drug use or sexual activity with multiple partners). The donor's travel history is screened to minimize risk of transmitting infections, such as malaria. The donated blood is tested for signs of infectious diseases, such as HIV and hepatitis. The blood is then tested to be sure it is compatible with you in order to minimize the chance of a transfusion reaction. If you or a relative donates blood, this is often done in anticipation of surgery and is not appropriate for emergency situations. It takes many days to process the donated blood. RISKS AND COMPLICATIONS Although transfusion therapy is very safe and saves many lives, the main dangers of transfusion include:   Getting an infectious disease.  Developing a transfusion reaction. This  is an allergic reaction to something in the blood you were given. Every precaution is taken to prevent this. The decision to have a blood transfusion has been considered carefully by your caregiver before blood is given. Blood is not given unless the benefits outweigh the risks. AFTER THE  TRANSFUSION  Right after receiving a blood transfusion, you will usually feel much better and more energetic. This is especially true if your red blood cells have gotten low (anemic). The transfusion raises the level of the red blood cells which carry oxygen, and this usually causes an energy increase.  The nurse administering the transfusion will monitor you carefully for complications. HOME CARE INSTRUCTIONS  No special instructions are needed after a transfusion. You may find your energy is better. Speak with your caregiver about any limitations on activity for underlying diseases you may have. SEEK MEDICAL CARE IF:   Your condition is not improving after your transfusion.  You develop redness or irritation at the intravenous (IV) site. SEEK IMMEDIATE MEDICAL CARE IF:  Any of the following symptoms occur over the next 12 hours:  Shaking chills.  You have a temperature by mouth above 102 F (38.9 C), not controlled by medicine.  Chest, back, or muscle pain.  People around you feel you are not acting correctly or are confused.  Shortness of breath or difficulty breathing.  Dizziness and fainting.  You get a rash or develop hives.  You have a decrease in urine output.  Your urine turns a dark color or changes to pink, red, or brown. Any of the following symptoms occur over the next 10 days:  You have a temperature by mouth above 102 F (38.9 C), not controlled by medicine.  Shortness of breath.  Weakness after normal activity.  The white part of the eye turns yellow (jaundice).  You have a decrease in the amount of urine or are urinating less often.  Your urine turns a dark color or changes to pink, red, or brown. Document Released: 04/04/2000 Document Revised: 06/30/2011 Document Reviewed: 11/22/2007 ExitCare Patient Information 2014 [place].  _______________________________________________________________________  Incentive Spirometer  An incentive  spirometer is a tool that can help keep your lungs clear and active. This tool measures how well you are filling your lungs with each breath. Taking long deep breaths may help reverse or decrease the chance of developing breathing (pulmonary) problems (especially infection) following:  A long period of time when you are unable to move or be active. BEFORE THE PROCEDURE   If the spirometer includes an indicator to show your best effort, your nurse or respiratory therapist will set it to a desired goal.  If possible, sit up straight or lean slightly forward. Try not to slouch.  Hold the incentive spirometer in an upright position. INSTRUCTIONS FOR USE  1. Sit on the edge of your bed if possible, or sit up as far as you can in bed or on a chair. 2. Hold the incentive spirometer in an upright position. 3. Breathe out normally. 4. Place the mouthpiece in your mouth and seal your lips tightly around it. 5. Breathe in slowly and as deeply as possible, raising the piston or the ball toward the top of the column. 6. Hold your breath for 3-5 seconds or for as long as possible. Allow the piston or ball to fall to the bottom of the column. 7. Remove the mouthpiece from your mouth and breathe out normally. 8. Rest for a few seconds and repeat Steps 1  through 7 at least 10 times every 1-2 hours when you are awake. Take your time and take a few normal breaths between deep breaths. 9. The spirometer may include an indicator to show your best effort. Use the indicator as a goal to work toward during each repetition. 10. After each set of 10 deep breaths, practice coughing to be sure your lungs are clear. If you have an incision (the cut made at the time of surgery), support your incision when coughing by placing a pillow or rolled up towels firmly against it. Once you are able to get out of bed, walk around indoors and cough well. You may stop using the incentive spirometer when instructed by your caregiver.   RISKS AND COMPLICATIONS  Take your time so you do not get dizzy or light-headed.  If you are in pain, you may need to take or ask for pain medication before doing incentive spirometry. It is harder to take a deep breath if you are having pain. AFTER USE  Rest and breathe slowly and easily.  It can be helpful to keep track of a log of your progress. Your caregiver can provide you with a simple table to help with this. If you are using the spirometer at home, follow these instructions: [place] IF:   You are having difficultly using the spirometer.  You have trouble using the spirometer as often as instructed.  Your pain medication is not giving enough relief while using the spirometer.  You develop fever of 100.5 F (38.1 C) or higher. SEEK IMMEDIATE MEDICAL CARE IF:   You cough up bloody sputum that had not been present before.  You develop fever of 102 F (38.9 C) or greater.  You develop worsening pain at or near the incision site. MAKE SURE YOU:   Understand these instructions.  Will watch your condition.  Will get help right away if you are not doing well or get worse. Document Released: 08/18/2006 Document Revised: 06/30/2011 Document Reviewed: 10/19/2006 [facility] Patient Information 2014 [place], [place].   ________________________________________________________________________

## 2017-01-09 ENCOUNTER — Encounter (HOSPITAL_COMMUNITY)
Admission: RE | Admit: 2017-01-09 | Discharge: 2017-01-09 | Disposition: A | Discharge status: Home / Self Care | Payer: Medicare Other | Source: Ambulatory Visit | Attending: Gynecologic Oncology | Admitting: Gynecologic Oncology

## 2017-01-09 ENCOUNTER — Encounter (HOSPITAL_COMMUNITY): Payer: Self-pay

## 2017-01-09 DIAGNOSIS — Z01812 Encounter for preprocedural laboratory examination: Secondary | ICD-10-CM | POA: Diagnosis not present

## 2017-01-09 DIAGNOSIS — Z1501 Genetic susceptibility to malignant neoplasm of breast: Secondary | ICD-10-CM | POA: Diagnosis not present

## 2017-01-09 HISTORY — DX: Cardiac murmur, unspecified: R01.1

## 2017-01-09 LAB — CBC
HCT: 35 % — ABNORMAL LOW (ref 36.0–46.0)
Hemoglobin: 12.3 g/dL (ref 12.0–15.0)
MCH: 31.4 pg (ref 26.0–34.0)
MCHC: 35.1 g/dL (ref 30.0–36.0)
MCV: 89.3 fL (ref 78.0–100.0)
PLATELETS: 152 10*3/uL (ref 150–400)
RBC: 3.92 MIL/uL (ref 3.87–5.11)
RDW: 12.3 % (ref 11.5–15.5)
WBC: 4.2 10*3/uL (ref 4.0–10.5)

## 2017-01-09 LAB — COMPREHENSIVE METABOLIC PANEL
ALK PHOS: 73 U/L (ref 38–126)
ALT: 25 U/L (ref 14–54)
AST: 28 U/L (ref 15–41)
Albumin: 4.4 g/dL (ref 3.5–5.0)
Anion gap: 8 (ref 5–15)
BUN: 16 mg/dL (ref 6–20)
CALCIUM: 9.5 mg/dL (ref 8.9–10.3)
CO2: 27 mmol/L (ref 22–32)
CREATININE: 0.65 mg/dL (ref 0.44–1.00)
Chloride: 104 mmol/L (ref 101–111)
Glucose, Bld: 123 mg/dL — ABNORMAL HIGH (ref 65–99)
Potassium: 4.2 mmol/L (ref 3.5–5.1)
Sodium: 139 mmol/L (ref 135–145)
TOTAL PROTEIN: 7.2 g/dL (ref 6.5–8.1)
Total Bilirubin: 1.1 mg/dL (ref 0.3–1.2)

## 2017-01-09 LAB — ABO/RH: ABO/RH(D): O POS

## 2017-01-09 NOTE — Progress Notes (Signed)
ekg 04/29/16 epic Echo 09/01/16 epic

## 2017-01-13 ENCOUNTER — Ambulatory Visit (HOSPITAL_COMMUNITY)
Admission: RE | Admit: 2017-01-13 | Discharge: 2017-01-13 | Disposition: A | Discharge status: Home / Self Care | Payer: Medicare Other | Source: Ambulatory Visit | Attending: Gynecologic Oncology | Admitting: Gynecologic Oncology

## 2017-01-13 ENCOUNTER — Ambulatory Visit (HOSPITAL_COMMUNITY): Payer: Medicare Other | Admitting: Anesthesiology

## 2017-01-13 ENCOUNTER — Encounter (HOSPITAL_COMMUNITY): Payer: Self-pay

## 2017-01-13 ENCOUNTER — Encounter (HOSPITAL_COMMUNITY)
Admission: RE | Disposition: A | Discharge status: Home / Self Care | Payer: Self-pay | Source: Ambulatory Visit | Attending: Gynecologic Oncology

## 2017-01-13 DIAGNOSIS — Z79899 Other long term (current) drug therapy: Secondary | ICD-10-CM | POA: Insufficient documentation

## 2017-01-13 DIAGNOSIS — Z853 Personal history of malignant neoplasm of breast: Secondary | ICD-10-CM | POA: Diagnosis not present

## 2017-01-13 DIAGNOSIS — K659 Peritonitis, unspecified: Secondary | ICD-10-CM | POA: Diagnosis not present

## 2017-01-13 DIAGNOSIS — C50512 Malignant neoplasm of lower-outer quadrant of left female breast: Secondary | ICD-10-CM | POA: Diagnosis not present

## 2017-01-13 DIAGNOSIS — M1990 Unspecified osteoarthritis, unspecified site: Secondary | ICD-10-CM | POA: Diagnosis not present

## 2017-01-13 DIAGNOSIS — Z803 Family history of malignant neoplasm of breast: Secondary | ICD-10-CM | POA: Diagnosis not present

## 2017-01-13 DIAGNOSIS — Z4002 Encounter for prophylactic removal of ovary: Secondary | ICD-10-CM | POA: Diagnosis not present

## 2017-01-13 DIAGNOSIS — Z87442 Personal history of urinary calculi: Secondary | ICD-10-CM | POA: Diagnosis not present

## 2017-01-13 DIAGNOSIS — Z1501 Genetic susceptibility to malignant neoplasm of breast: Secondary | ICD-10-CM | POA: Diagnosis not present

## 2017-01-13 DIAGNOSIS — Z923 Personal history of irradiation: Secondary | ICD-10-CM | POA: Insufficient documentation

## 2017-01-13 DIAGNOSIS — R011 Cardiac murmur, unspecified: Secondary | ICD-10-CM | POA: Insufficient documentation

## 2017-01-13 DIAGNOSIS — E785 Hyperlipidemia, unspecified: Secondary | ICD-10-CM | POA: Insufficient documentation

## 2017-01-13 DIAGNOSIS — Z7982 Long term (current) use of aspirin: Secondary | ICD-10-CM | POA: Diagnosis not present

## 2017-01-13 DIAGNOSIS — M8580 Other specified disorders of bone density and structure, unspecified site: Secondary | ICD-10-CM | POA: Diagnosis not present

## 2017-01-13 DIAGNOSIS — I351 Nonrheumatic aortic (valve) insufficiency: Secondary | ICD-10-CM | POA: Insufficient documentation

## 2017-01-13 DIAGNOSIS — Z888 Allergy status to other drugs, medicaments and biological substances status: Secondary | ICD-10-CM | POA: Insufficient documentation

## 2017-01-13 DIAGNOSIS — Z885 Allergy status to narcotic agent status: Secondary | ICD-10-CM | POA: Diagnosis not present

## 2017-01-13 DIAGNOSIS — K219 Gastro-esophageal reflux disease without esophagitis: Secondary | ICD-10-CM | POA: Diagnosis not present

## 2017-01-13 DIAGNOSIS — Z1509 Genetic susceptibility to other malignant neoplasm: Secondary | ICD-10-CM

## 2017-01-13 DIAGNOSIS — Z1502 Genetic susceptibility to malignant neoplasm of ovary: Secondary | ICD-10-CM

## 2017-01-13 HISTORY — PX: ROBOTIC ASSISTED BILATERAL SALPINGO OOPHERECTOMY: SHX6078

## 2017-01-13 LAB — TYPE AND SCREEN
ABO/RH(D): O POS
ANTIBODY SCREEN: NEGATIVE

## 2017-01-13 SURGERY — SALPINGO-OOPHORECTOMY, BILATERAL, ROBOT-ASSISTED
Anesthesia: General | Laterality: Bilateral

## 2017-01-13 MED ORDER — SUGAMMADEX SODIUM 200 MG/2ML IV SOLN
INTRAVENOUS | Status: AC
  Filled 2017-01-13: qty 2

## 2017-01-13 MED ORDER — SUCCINYLCHOLINE CHLORIDE 200 MG/10ML IV SOSY
PREFILLED_SYRINGE | INTRAVENOUS | Status: DC | PRN
  Administered 2017-01-13: 100 mg via INTRAVENOUS

## 2017-01-13 MED ORDER — PROPOFOL 10 MG/ML IV BOLUS
INTRAVENOUS | Status: DC | PRN
  Administered 2017-01-13: 150 mg via INTRAVENOUS

## 2017-01-13 MED ORDER — ROCURONIUM BROMIDE 50 MG/5ML IV SOSY
PREFILLED_SYRINGE | INTRAVENOUS | Status: AC
Start: 2017-01-13 — End: 2017-01-13
  Filled 2017-01-13: qty 5

## 2017-01-13 MED ORDER — ONDANSETRON HCL 4 MG/2ML IJ SOLN
INTRAMUSCULAR | Status: DC | PRN
  Administered 2017-01-13: 4 mg via INTRAVENOUS

## 2017-01-13 MED ORDER — LIDOCAINE 2% (20 MG/ML) 5 ML SYRINGE
INTRAMUSCULAR | Status: DC | PRN
  Administered 2017-01-13: 80 mg via INTRAVENOUS

## 2017-01-13 MED ORDER — LACTATED RINGERS IV SOLN
INTRAVENOUS | Status: DC
  Administered 2017-01-13: 1000 mL via INTRAVENOUS
  Administered 2017-01-13: 08:00:00 via INTRAVENOUS

## 2017-01-13 MED ORDER — DEXAMETHASONE SODIUM PHOSPHATE 10 MG/ML IJ SOLN
INTRAMUSCULAR | Status: AC
  Filled 2017-01-13: qty 1

## 2017-01-13 MED ORDER — MIDAZOLAM HCL 5 MG/5ML IJ SOLN
INTRAMUSCULAR | Status: DC | PRN
  Administered 2017-01-13: 2 mg via INTRAVENOUS

## 2017-01-13 MED ORDER — SUGAMMADEX SODIUM 200 MG/2ML IV SOLN
INTRAVENOUS | Status: DC | PRN
  Administered 2017-01-13: 120 mg via INTRAVENOUS

## 2017-01-13 MED ORDER — LIDOCAINE 2% (20 MG/ML) 5 ML SYRINGE
INTRAMUSCULAR | Status: AC
Start: 2017-01-13 — End: 2017-01-13
  Filled 2017-01-13: qty 5

## 2017-01-13 MED ORDER — CEFAZOLIN SODIUM-DEXTROSE 2-4 GM/100ML-% IV SOLN
2.0000 g | INTRAVENOUS | Status: AC
  Administered 2017-01-13: 2 g via INTRAVENOUS
  Filled 2017-01-13: qty 100

## 2017-01-13 MED ORDER — FENTANYL CITRATE (PF) 250 MCG/5ML IJ SOLN
INTRAMUSCULAR | Status: AC
  Filled 2017-01-13: qty 5

## 2017-01-13 MED ORDER — ONDANSETRON HCL 4 MG/2ML IJ SOLN: 4.0000 mg | Freq: Once | INTRAMUSCULAR | Status: DC | PRN

## 2017-01-13 MED ORDER — PROPOFOL 10 MG/ML IV BOLUS
INTRAVENOUS | Status: AC
  Filled 2017-01-13: qty 20

## 2017-01-13 MED ORDER — EPHEDRINE SULFATE-NACL 50-0.9 MG/10ML-% IV SOSY
PREFILLED_SYRINGE | INTRAVENOUS | Status: DC | PRN
  Administered 2017-01-13 (×3): 5 mg via INTRAVENOUS

## 2017-01-13 MED ORDER — ARTIFICIAL TEARS OPHTHALMIC OINT
TOPICAL_OINTMENT | OPHTHALMIC | Status: AC
  Filled 2017-01-13: qty 3.5

## 2017-01-13 MED ORDER — OXYCODONE-ACETAMINOPHEN 5-325 MG PO TABS: 1.0000 | ORAL_TABLET | ORAL | 0 refills | Status: DC | PRN

## 2017-01-13 MED ORDER — STERILE WATER FOR IRRIGATION IR SOLN
Status: DC | PRN
  Administered 2017-01-13: 1000 mL

## 2017-01-13 MED ORDER — EPHEDRINE 5 MG/ML INJ
INTRAVENOUS | Status: AC
  Filled 2017-01-13: qty 10

## 2017-01-13 MED ORDER — ROCURONIUM BROMIDE 10 MG/ML (PF) SYRINGE
PREFILLED_SYRINGE | INTRAVENOUS | Status: DC | PRN
  Administered 2017-01-13: 10 mg via INTRAVENOUS
  Administered 2017-01-13: 30 mg via INTRAVENOUS

## 2017-01-13 MED ORDER — ONDANSETRON HCL 4 MG/2ML IJ SOLN
INTRAMUSCULAR | Status: AC
  Filled 2017-01-13: qty 2

## 2017-01-13 MED ORDER — DEXAMETHASONE SODIUM PHOSPHATE 10 MG/ML IJ SOLN
INTRAMUSCULAR | Status: DC | PRN
  Administered 2017-01-13: 10 mg via INTRAVENOUS

## 2017-01-13 MED ORDER — SUCCINYLCHOLINE CHLORIDE 200 MG/10ML IV SOSY
PREFILLED_SYRINGE | INTRAVENOUS | Status: AC
  Filled 2017-01-13: qty 10

## 2017-01-13 MED ORDER — MIDAZOLAM HCL 2 MG/2ML IJ SOLN
INTRAMUSCULAR | Status: AC
Start: 2017-01-13 — End: 2017-01-13
  Filled 2017-01-13: qty 2

## 2017-01-13 MED ORDER — GLYCOPYRROLATE 0.2 MG/ML IV SOSY
PREFILLED_SYRINGE | INTRAVENOUS | Status: AC
  Filled 2017-01-13: qty 5

## 2017-01-13 MED ORDER — FENTANYL CITRATE (PF) 100 MCG/2ML IJ SOLN: 25.0000 ug | INTRAMUSCULAR | Status: DC | PRN

## 2017-01-13 MED ORDER — FENTANYL CITRATE (PF) 100 MCG/2ML IJ SOLN
INTRAMUSCULAR | Status: DC | PRN
  Administered 2017-01-13 (×4): 50 ug via INTRAVENOUS

## 2017-01-13 MED ORDER — GLYCOPYRROLATE 0.2 MG/ML IV SOSY
PREFILLED_SYRINGE | INTRAVENOUS | Status: DC | PRN
  Administered 2017-01-13: .2 mg via INTRAVENOUS

## 2017-01-13 MED ORDER — LACTATED RINGERS IR SOLN
Status: DC | PRN
  Administered 2017-01-13: 1000 mL

## 2017-01-13 SURGICAL SUPPLY — 44 items
BAG LAPAROSCOPIC 12 15 PORT 16 (BASKET) IMPLANT
BAG RETRIEVAL 12/15 (BASKET)
BAG SPEC RTRVL LRG 6X4 10 (ENDOMECHANICALS) ×2
CATH FOLEY 2WAY SLVR  5CC 14FR (CATHETERS) ×1
CATH FOLEY 2WAY SLVR 5CC 14FR (CATHETERS) IMPLANT
CHLORAPREP W/TINT 26ML (MISCELLANEOUS) ×2 IMPLANT
COVER BACK TABLE 60X90IN (DRAPES) ×2 IMPLANT
COVER TIP SHEARS 8 DVNC (MISCELLANEOUS) ×1 IMPLANT
COVER TIP SHEARS 8MM DA VINCI (MISCELLANEOUS) ×1
DRAPE ARM DVNC X/XI (DISPOSABLE) ×4 IMPLANT
DRAPE COLUMN DVNC XI (DISPOSABLE) ×1 IMPLANT
DRAPE DA VINCI XI ARM (DISPOSABLE) ×4
DRAPE DA VINCI XI COLUMN (DISPOSABLE) ×1
DRAPE SHEET LG 3/4 BI-LAMINATE (DRAPES) ×3 IMPLANT
DRAPE SURG IRRIG POUCH 19X23 (DRAPES) ×2 IMPLANT
ELECT REM PT RETURN 15FT ADLT (MISCELLANEOUS) ×2 IMPLANT
GLOVE BIO SURGEON STRL SZ 6 (GLOVE) ×8 IMPLANT
GLOVE BIO SURGEON STRL SZ 6.5 (GLOVE) ×4 IMPLANT
GOWN STRL REUS W/ TWL LRG LVL3 (GOWN DISPOSABLE) ×3 IMPLANT
GOWN STRL REUS W/TWL LRG LVL3 (GOWN DISPOSABLE) ×6
HOLDER FOLEY CATH W/STRAP (MISCELLANEOUS) ×2 IMPLANT
IRRIG SUCT STRYKERFLOW 2 WTIP (MISCELLANEOUS) ×2
IRRIGATION SUCT STRKRFLW 2 WTP (MISCELLANEOUS) ×1 IMPLANT
MANIPULATOR UTERINE 4.5 ZUMI (MISCELLANEOUS) ×1 IMPLANT
OBTURATOR OPTICAL STANDARD 8MM (TROCAR) ×1
OBTURATOR OPTICAL STND 8 DVNC (TROCAR) ×1
OBTURATOR OPTICALSTD 8 DVNC (TROCAR) ×1 IMPLANT
PACK ROBOT GYN CUSTOM WL (TRAY / TRAY PROCEDURE) ×2 IMPLANT
PAD POSITIONING PINK XL (MISCELLANEOUS) ×2 IMPLANT
POUCH SPECIMEN RETRIEVAL 10MM (ENDOMECHANICALS) ×2 IMPLANT
SEAL CANN UNIV 5-8 DVNC XI (MISCELLANEOUS) ×4 IMPLANT
SEAL XI 5MM-8MM UNIVERSAL (MISCELLANEOUS) ×4
SET TRI-LUMEN FLTR TB AIRSEAL (TUBING) ×1 IMPLANT
SOLUTION ELECTROLUBE (MISCELLANEOUS) ×2 IMPLANT
SUT VIC AB 0 CT1 27 (SUTURE)
SUT VIC AB 0 CT1 27XBRD ANTBC (SUTURE) IMPLANT
SUT VICRYL 0 UR6 27IN ABS (SUTURE) ×2 IMPLANT
SYR BULB IRRIGATION 50ML (SYRINGE) ×1 IMPLANT
TOWEL OR NON WOVEN STRL DISP B (DISPOSABLE) ×2 IMPLANT
TRAP SPECIMEN MUCOUS 40CC (MISCELLANEOUS) ×1 IMPLANT
TRAY FOLEY W/METER SILVER 16FR (SET/KITS/TRAYS/PACK) ×2 IMPLANT
UNDERPAD 30X30 (UNDERPADS AND DIAPERS) ×2 IMPLANT
WATER STERILE IRR 1000ML POUR (IV SOLUTION) ×2 IMPLANT
YANKAUER SUCT BULB TIP NO VENT (SUCTIONS) ×1 IMPLANT

## 2017-01-13 NOTE — Interval H&P Note (Signed)
History and Physical Interval Note:  01/13/2017 9:28 AM  Cheryl [name]  has presented today for surgery, with the diagnosis of BRCA II mutation  The various methods of treatment have been discussed with the patient and family. After consideration of risks, benefits and other options for treatment, the patient has consented to  Procedure(s): XI ROBOTIC [place] (Bilateral) as a surgical intervention .  The patient's history has been reviewed, patient examined, no change in status, stable for surgery.  I have reviewed the patient's chart and labs.  Questions were answered to the patient's satisfaction.     [name]

## 2017-01-13 NOTE — Discharge Instructions (Signed)
01/13/2017  Return to work: 4 weeksGeneral Anesthesia, Adult, Care After These instructions provide you with information about caring for yourself after your procedure. Your health care provider may also give you more specific instructions. Your treatment has been planned according to current medical practices, but problems sometimes occur. Call your health care provider if you have any problems or questions after your procedure. What can I expect after the procedure? After the procedure, it is common to have:  Vomiting.  A sore throat.  Mental slowness.  It is common to feel:  Nauseous.  Cold or shivery.  Sleepy.  Tired.  Sore or achy, even in parts of your body where you did not have surgery.  Follow these instructions at home: For at least 24 hours after the procedure:  Do not: ? Participate in activities where you could fall or become injured. ? Drive. ? Use heavy machinery. ? Drink alcohol. ? Take sleeping pills or medicines that cause drowsiness. ? Make important decisions or sign legal documents. ? Take care of children on your own.  Rest. Eating and drinking  If you vomit, drink water, juice, or soup when you can drink without vomiting.  Drink enough fluid to keep your urine clear or pale yellow.  Make sure you have little or no nausea before eating solid foods.  Follow the diet recommended by your health care provider. General instructions  Have a responsible adult stay with you until you are awake and alert.  Return to your normal activities as told by your health care provider. Ask your health care provider what activities are safe for you.  Take over-the-counter and prescription medicines only as told by your health care provider.  If you smoke, do not smoke without supervision.  Keep all follow-up visits as told by your health care provider. This is important. Contact a health care provider if:  You continue to have nausea or vomiting at home,  and medicines are not helpful.  You cannot drink fluids or start eating again.  You cannot urinate after 8-12 hours.  You develop a skin rash.  You have fever.  You have increasing redness at the site of your procedure. Get help right away if:  You have difficulty breathing.  You have chest pain.  You have unexpected bleeding.  You feel that you are having a life-threatening or urgent problem. This information is not intended to replace advice given to you by your health care provider. Make sure you discuss any questions you have with your health care provider. Document Released: 07/14/2000 Document Revised: 09/10/2015 Document Reviewed: 03/22/2015 Elsevier Interactive Patient Education  2018 [employer].   Activity: 1. Be up and out of the bed during the day.  Take a nap if needed.  You may walk up steps but be careful and use the hand rail.  Stair climbing will tire you more than you think, you may need to stop part way and rest.   2. No lifting or straining for 6 weeks.  3. No driving for 1 weeks.  Do Not drive if you are taking narcotic pain medicine.  4. Shower daily.  Use soap and water on your incision and pat dry; don't rub.   5. No sexual activity and nothing in the vagina for 2 weeks.  Medications:  - Take ibuprofen and tylenol first line for pain control. Take these regularly (every 6 hours) to decrease the build up of pain.  - If necessary, for severe pain not relieved by  ibuprofen, take percocet.  - While taking percocet you should take sennakot every night to reduce the likelihood of constipation. If this causes diarrhea, stop its use.  Diet: 1. Low sodium Heart Healthy Diet is recommended.  2. It is safe to use a laxative if you have difficulty moving your bowels.   Wound Care: 1. Keep clean and dry.  Shower daily. 2. It is normal to have some bleeding for about 2 weeks.  Reasons to call the Doctor:   Fever - Oral temperature greater than 100.4  degrees Fahrenheit  Foul-smelling vaginal discharge  Difficulty urinating  Nausea and vomiting  Increased pain at the site of the incision that is unrelieved with pain medicine.  Difficulty breathing with or without chest pain  New calf pain especially if only on one side  Sudden, continuing increased vaginal bleeding with or without clots.   Follow-up: 1. See Everitt Amber in 4 weeks.  Contacts: For questions or concerns you should contact:  Dr. Everitt Amber at [phone] After hours and on week-ends call [phone] and ask to speak to the physician on call for Gynecologic Oncology   Bilateral Salpingo-Oophorectomy, Care After This sheet gives you information about how to care for yourself after your procedure. Your health care provider may also give you more specific instructions. If you have problems or questions, contact your health care provider. What can I expect after the procedure? After the procedure, it is common to have:  Abdominal pain.  Some occasional vaginal bleeding (spotting).  Tiredness.  Symptoms of menopause, such as hot flashes, night sweats, or mood swings.  Follow these instructions at home: Incision care  Keep your incision area and your bandage (dressing) clean and dry.  Follow instructions from your health care provider about how to take care of your incision. Make sure you: ? Wash your hands with soap and water before you change your dressing. If soap and water are not available, use hand sanitizer. ? Change your dressing as told by your health care provider. ? Leave stitches (sutures), staples, skin glue, or adhesive strips in place. These skin closures may need to stay in place for 2 weeks or longer. If adhesive strip edges start to loosen and curl up, you may trim the loose edges. Do not remove adhesive strips completely unless your health care provider tells you to do that.  Check your incision area every day for signs of infection. Check  for: ? Redness, swelling, or pain. ? Fluid or blood. ? Warmth. ? Pus or a bad smell. Activity  Do not drive or use heavy machinery while taking prescription pain medicine.  Do not drive for 24 hours if you received a medicine to help you relax (sedative) during your procedure.  Take frequent, short walks throughout the day. Rest when you get tired. Ask your health care provider what activities are safe for you.  Avoid activity that requires great effort. Also, avoid heavy lifting. Do not lift anything that is heavier than 10 lbs. (4.5 kg), or the limit that your health care provider tells you, until he or she says that it is safe to do so.  Do not douche, use tampons, or have sex until your health care provider approves. General instructions  To prevent or treat constipation while you are taking prescription pain medicine, your health care provider may recommend that you: ? Drink enough fluid to keep your urine clear or pale yellow. ? Take over-the-counter or prescription medicines. ? Eat foods  that are high in fiber, such as fresh fruits and vegetables, whole grains, and beans. ? Limit foods that are high in fat and processed sugars, such as fried and sweet foods.  Take over-the-counter and prescription medicines only as told by your health care provider.  Do not take baths, swim, or use a hot tub until your health care provider approves. Ask your health care provider if you can take showers. You may only be allowed to take sponge baths for bathing.  Wear compression stockings as told by your health care provider. These stockings help to prevent blood clots and reduce swelling in your legs.  Keep all follow-up visits as told by your health care provider. This is important. Contact a health care provider if:  You have pain when you urinate.  You have pus or a bad smelling discharge coming from your vagina.  You have redness, swelling, or pain around your incision.  You have  fluid or blood coming from your incision.  Your incision feels warm to the touch.  You have pus or a bad smell coming from your incision.  You have a fever.  Your incision starts to break open.  You have pain in the abdomen, and it gets worse or does not get better when you take medicine.  You develop a rash.  You develop nausea and vomiting.  You feel lightheaded. Get help right away if:  You develop pain in your chest or leg.  You become short of breath.  You faint.  You have increased bleeding from your vagina. Summary  After the procedure, it is common to have pain, bleeding in the vagina, and symptoms of menopause.  Follow instructions from your health care provider about how to take care of your incision.  Follow instructions from your health care provider about activities and restrictions.  Check your incision every day for signs of infection and report any symptoms to your health care provider. This information is not intended to replace advice given to you by your health care provider. Make sure you discuss any questions you have with your health care provider. Document Released: 04/07/2005 Document Revised: 05/12/2016 Document Reviewed: 05/12/2016 Elsevier Interactive Patient Education  [employer].

## 2017-01-13 NOTE — H&P (View-Only) (Signed)
Consult Note: Gyn-Onc  Consult was requested by Dr. Phineas Real for the evaluation of Cheryl [name] 66 y.o. female  CC:  Chief Complaint  Patient presents with  . BRCA2 positive    Assessment/Plan:  Cheryl [name]  is a 66 y.o.  year old with a history of an indeterminant BRCA 2 mutation and bilateral breast cancer (recently diagnosed and treated left sided lesion).  She is now ready for BSO.  She had originally planned on hysterectomy and BSO. I discussed that hysterectomy could be accomplished but was not medically necessary as part of the BRCA2 mutation (and her risk for endometrial cancer is consistent with population averages). I discussed that hysterectomy is associated with increased surgical risk and recovery. However, given her anxiety about potential uterine cancer, I offered her hysterectomy if she is interested in that. After learning of the increased risks and unclear medical virtue of hysterectomy in her case, she is electing for BSO. Given her history of TRAM flap with mesh, and her apparent large abdominal wall hernia, she may require extensive lysis of adhesions. She requires preop ancef to decrease risk of cellulitis in her right upper extremity.  HPI: Cheryl [name] is a very pleasant 66 year old G0 who is seen in consultation at the request of Dr Phineas Real for an indeterminent BRCA2 mutation in the setting of a new left sided breast cancer diagnosis.  The patient was tested in 2009 and the pannet revealed a mutation of [id number] which was considered of uncertain significance.  The patient has a personal history of right breast cancer, a mother with bilateral breast cancer and multiple great aunts with breast cancer. There is no ovarian cancer family history.  The patient is nulliparous. She is generally very healthy. Her only abdominal surgery was a TRAM flap reconstruction in 1999 at [facility] (mesh was used).   Her last abdominal US was normal in 2009.  She is a patient  of Dr Gearldine Shown for breast cancer management.   Interval Hx: She completed treatment with surgery, radiation and arimidex.  CA 125 in November, 2017 was normal at 63, and [place] in November, 2017 showed a normal uterus measuring 4.2x2.3x3.4cm and nonvisualized ovaries.  Current Meds:  Outpatient Encounter Prescriptions as of 12/24/2016  Medication Sig  . anastrozole (ARIMIDEX) 1 MG tablet Take 1 tablet (1 mg total) by mouth daily. Please give 3 month supply  . Coenzyme Q10 (COQ10) 100 MG CAPS Take 100 mg by mouth daily.  Marland Kitchen esomeprazole (NEXIUM) 20 MG capsule Take 20 mg by mouth daily before breakfast.   . ezetimibe (ZETIA) 10 MG tablet Take 1 tablet (10 mg total) by mouth daily.  . fexofenadine (ALLEGRA) 180 MG tablet Take 180 mg by mouth daily as needed (for sinus/allergies.).  Marland Kitchen non-metallic deodorant (ALRA) MISC Apply 1 application topically daily as needed.  . propranolol (INDERAL LA) 80 MG 24 hr capsule Take 80 mg by mouth daily with breakfast.   . rizatriptan (MAXALT) 10 MG tablet Take 10 mg by mouth as needed. May repeat in 2 hours if needed  . rosuvastatin (CRESTOR) 40 MG tablet Take 1 tablet (40 mg total) by mouth daily.  Marland Kitchen VITAMIN D, ERGOCALCIFEROL, PO Take 2-3 drops by mouth 2 (two) times daily. 3 drops in the morning & 2 drops in the evening  . cephALEXin (KEFLEX) 500 MG capsule Take 500 mg by mouth 4 (four) times daily as needed. cellulitis flare up.  . [DISCONTINUED] aspirin EC 81 MG tablet Take 1  tablet (81 mg total) by mouth daily.  . [DISCONTINUED] cephALEXin (KEFLEX) 500 MG capsule Take 500 mg by mouth 4 (four) times daily as needed (take as directed when cellulitis flares up). Four times daily for 3 weeks for cellulitis flare up due to lymphedema   No facility-administered encounter medications on file as of 12/24/2016.     Allergy:  Allergies  Allergen Reactions  . Banthine [Methantheline] Anaphylaxis  . Demerol Hives  . Theophyllines Palpitations    Social Hx:    Social History   Social History  . Marital status: Single    Spouse name: N/A  . Number of children: N/A  . Years of education: N/A   Occupational History  . Nurse [employer]   Social History Main Topics  . Smoking status: Never Smoker  . Smokeless tobacco: Never Used  . Alcohol use 0.0 oz/week     Comment: VERY RARE  . Drug use: No  . Sexual activity: No     Comment:  [place]   Other Topics Concern  . Not on file   Social History Narrative  . No narrative on file    Past Surgical Hx:  Past Surgical History:  Procedure Laterality Date  . BREAST LUMPECTOMY WITH RADIOACTIVE SEED AND SENTINEL LYMPH NODE BIOPSY Left 05/07/2016   Procedure: LEFT BREAST LUMPECTOMY WITH RADIOACTIVE SEED AND LEFT AXILLARY SENTINEL LYMPH NODE BIOPSY;  Surgeon: Fanny Skates, MD;  Location: [facility];  Service: General;  Laterality: Left;  . BREAST SURGERY     Mastectomy with TRAM  . CYSTOGRAM    . FINGER SURGERY Right 01/1999   finger on the hand  . FOOT SURGERY  2004,2015  . FOOT SURGERY Left 07/2005   3 toes  . left thumb  1/06  . right mastectomy  10-27-97   with tram flap  . right thumb  1/05  . TONSILLECTOMY AND ADENOIDECTOMY      Past Medical Hx:  Past Medical History:  Diagnosis Date  . Arthritis   . Breast cancer ([place]) 1999   right breast, Adriamycin Therapy  . Breast cancer of lower-outer quadrant of left female breast ([place]) 05/07/2016  . Cellulitis   . Chronic acquired lymphedema    rt arm, s/p right breast cancer  . GERD (gastroesophageal reflux disease)   . Headache   . 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 History of external beam radiation therapy 06/12/16-07/28/16   left breast 50.4 Gy in 28 fractions, left breast boost 10 Gy in 5 fractions  . History of hiatal hernia   . History of kidney stones   . History of migraine   . Hyperlipidemia   . Kidney problem 1969   history of left kidney bleeding  . Osteopenia 02/2015   T score -2.0. Prior DEXA 2010 T score -1.7.  FRAX 20%/2.1%. Based upon prior history of Fosamax treatment will hold on treatment now with repeat at 2 year interval for stability  . Strep throat    age 48  . Ventricular ectopy    chronic    Past Gynecological History:  G0 No LMP recorded. Patient is postmenopausal.  Family Hx:  Family History  Problem Relation Age of Onset  . Breast cancer Mother 36       dx 3 times, bilateral  . Heart disease Father        CABG  . Hypertension Father   . Dementia Father   . Hyperlipidemia Sister   . Tuberculosis Paternal Grandmother   .  Heart attack Paternal Grandfather   . Breast cancer Other        MGM's sister  . Breast cancer Other        MGMs sister  . Breast cancer Other        MGMs sister  . Prostate cancer Other        MGMs father    Review of Systems:  Constitutional  Feels well,    ENT Normal appearing ears and nares bilaterally Skin/Breast  No rash, sores, jaundice, itching, dryness Cardiovascular  No chest pain, shortness of breath, or edema  Pulmonary  No cough or wheeze.  Gastro Intestinal  No nausea, vomitting, or diarrhoea. No bright red blood per rectum, no abdominal pain, change in bowel movement, or constipation.  Genito Urinary  No frequency, urgency, dysuria, no bleeding Musculo Skeletal  No myalgia, arthralgia, joint swelling or pain  Neurologic  No weakness, numbness, change in gait,  Psychology  No depression, anxiety, insomnia.   Vitals:  Blood pressure 125/69, pulse 67, temperature 97.7 F (36.5 C), temperature source Oral, resp. rate 20, weight 136 lb 8 oz (61.9 kg), SpO2 98 %.  Physical Exam: WD in NAD Neck  Supple NROM, without any enlargements.  Lymph Node Survey No cervical supraclavicular or inguinal adenopathy Cardiovascular  Pulse normal rate, regularity and rhythm. S1 and S2 normal.  Lungs  Clear to auscultation bilateraly, without wheezes/crackles/rhonchi. Good air movement.  Skin  No rash/lesions/breakdown  Psychiatry   Alert and oriented to person, place, and time  Abdomen  Normoactive bowel sounds, abdomen soft, non-tender and obese without evidence of hernia.  Back No CVA tenderness Genito Urinary  Vulva/vagina: Normal external female genitalia.  No lesions. No discharge or bleeding.  Bladder/urethra:  No lesions or masses, well supported bladder  Vagina: narrow introitus.  Cervix: Normal appearing, no lesions.  Uterus: Small, mobile, no parametrial involvement or nodularity.  Adnexa: no palpable masses. Rectal  deferred Extremities  No bilateral cyanosis, clubbing or edema.   [name], MD  12/24/2016, 4:06 PM

## 2017-01-13 NOTE — Transfer of Care (Signed)
Immediate Anesthesia Transfer of Care Note  Patient: Cheryl [name]  Procedure(s) Performed: Procedure(s): XI ROBOTIC ASSISTED BILATERAL SALPINGO OOPHORECTOMY REPAIR OF CERVICAL LACERATION (Bilateral)  Patient Location: PACU  Anesthesia Type:General  Level of Consciousness: awake, alert , oriented and patient cooperative  Airway & Oxygen Therapy: Patient Spontanous Breathing and Patient connected to face mask oxygen  Post-op Assessment: Report given to RN, Post -op Vital signs reviewed and stable and Patient moving all extremities  Post vital signs: Reviewed and stable  Last Vitals:  Vitals:   01/13/17 0750  BP: (!) 142/73  Pulse: 67  Resp: 20  Temp: 37.6 C  SpO2: 100%    Last Pain:  Vitals:   01/13/17 0915  TempSrc:   PainSc: 0-No pain      Patients Stated Pain Goal: 4 (36/43/83 7793)  Complications: No apparent anesthesia complications

## 2017-01-13 NOTE — Op Note (Addendum)
OPERATIVE NOTE  Date: 01/13/17  Preoperative Diagnosis: BRCA2 germline mutation   Postoperative Diagnosis:  same  Procedure(s) Performed: Robotic-assisted laparoscopic bilateral salpingo-oophorectomy  Surgeon: Everitt Amber, M.D.  Assistant Surgeon: Lahoma Crocker M.D. (an MD assistant was necessary for tissue manipulation, management of robotic instrumentation, retraction and positioning due to the complexity of the case and hospital policies).   Anesthesia: Gen. endotracheal.  Specimens: Bilateral ovaries, fallopian tubes, pelvic washings  Estimated Blood Loss: <50 mL. Blood Replacement: None  Complications: none  Indication for Procedure:  BRCA 2 germline mutation  Operative Findings: normal appearing tubes and ovaries, normal appendix, normal omentum and upper abdomen, normal 5cm uterus.   Procedure: The patient's taken to the operating room and placed under general endotracheal anesthesia testing difficulty. She is placed in a dorsolithotomy position and cervical acromial pad was placed. The arms were tucked with care taken to pad the olecranon process. And prepped and draped in usual sterile fashion. A uterine manipulator (zumi) was placed vaginally. A 15m incision was made in the left upper quadrant palmer's point and a 5 mm Optiview trocar used to enter the abdomen under direct visualization. With entry into the abdomen and then maintenance of 15 mm of mercury the patient was placed in Trendelenburg position. An incision was made in the umbilicus and a 112AEtrochar was placed through this site. Two incisions were made lateral to the umbilical incision in the left and right abdomen measuring 840m These incisions were made approximately 10 cm lateral to the umbilical incision. 8 mm robotic trochars were inserted. The robot was docked.  The abdomen was inspected as was the pelvis.  Pelvic washings were obtained. An incision was made on the right pelvic side wall peritoneum parallel  to the IP ligament and the retroperitoneal space entered. The right ureter was identified and the para-rectal space was developed. A window was created in the right broad ligament above the ureter. The right infundibulopelvic vessels were skeletonized cauterized and transected. The utero-ovarian ligaments similarly were cauterized and transected. Specimen was placed in an Endo Catch bag.  In a similar manner the left peritoneum and the side wall was incised, and the retroperitoneal space entered. The left ureter was identified and the left pararectal space was developed. The utero-ovarian ligament was skeletonized cauterized and transected. The left utero-ovarian ligaments were cauterized and transected in the left adnexa was placed in an Endo Catch bag.  The abdomen was copiously irrigated and drained and all operative sites inspected and hemostasis was assured  The robot was undocked. The contents of the Endo Catch bags were removed from the abdominal cavity through the LUQ incision.  The ports were all remove. The fascial closure at the left upper quadrant port was made with 0 Vicryl.  All incisions were closed with a running subcuticular Monocryl suture. Dermabond was applied.  The cervix was bleeding from the anterior cervix where the tenaculum had been placed. This was sutured for hemostasis vaginally with 0-vicryl on a UR needle. Sponge, lap and needle counts were correct x 3.    The patient had sequential compression devices for VTE prophylaxis.         Disposition: PACU          Condition: stable  RoDonaciano EvaMD

## 2017-01-13 NOTE — Anesthesia Postprocedure Evaluation (Signed)
Anesthesia Post Note  Patient: Cheryl [name]  Procedure(s) Performed: Procedure(s) (LRB): XI ROBOTIC ASSISTED BILATERAL SALPINGO OOPHORECTOMY REPAIR OF CERVICAL LACERATION (Bilateral)     Patient location during evaluation: PACU Anesthesia Type: General Level of consciousness: awake and alert Pain management: pain level controlled Vital Signs Assessment: post-procedure vital signs reviewed and stable Respiratory status: spontaneous breathing, nonlabored ventilation and respiratory function stable Cardiovascular status: blood pressure returned to baseline and stable Postop Assessment: no apparent nausea or vomiting Anesthetic complications: no    Last Vitals:  Vitals:   01/13/17 1215 01/13/17 1230  BP: 132/71 130/82  Pulse: 60 (!) 59  Resp: 12 20  Temp:    SpO2: 100% 98%    Last Pain:  Vitals:   01/13/17 1215  TempSrc:   PainSc: 0-No pain                 Audry Pili

## 2017-01-13 NOTE — Addendum Note (Signed)
Addendum  created 01/13/17 1400 by Victoriano Lain, CRNA   Anesthesia Intra Flowsheets edited

## 2017-01-13 NOTE — Anesthesia Procedure Notes (Signed)
Procedure Name: Intubation Date/Time: 01/13/2017 10:13 AM Performed by: Carleene Cooper A Pre-anesthesia Checklist: Patient identified, Emergency Drugs available, Suction available, Patient being monitored and Timeout performed Patient Re-evaluated:Patient Re-evaluated prior to induction Oxygen Delivery Method: Circle system utilized Preoxygenation: Pre-oxygenation with 100% oxygen Induction Type: IV induction Ventilation: Mask ventilation without difficulty Laryngoscope Size: Mac and 4 Grade View: Grade II Tube type: Oral Tube size: 7.5 mm Number of attempts: 2 Airway Equipment and Method: Stylet Placement Confirmation: ETT inserted through vocal cords under direct vision,  positive ETCO2 and breath sounds checked- equal and bilateral Secured at: 22 cm Tube secured with: Tape Dental Injury: Teeth and Oropharynx as per pre-operative assessment  Comments: DL X 1 by Sonia Side, EMS student. -  ETCO2. ETT removed. Easy mask. DL X 1 by CRNA. Grade 2 view. BBS=, + ETCO2. ATOI. VSS

## 2017-01-13 NOTE — Anesthesia Preprocedure Evaluation (Addendum)
Anesthesia Evaluation  Patient identified by MRN, date of birth, ID band Patient awake    Reviewed: Allergy & Precautions, NPO status , Patient's Chart, lab work & pertinent test results  Airway Mallampati: III  TM Distance: >3 FB Neck ROM: Full    Dental  (+) Dental Advisory Given   Pulmonary neg pulmonary ROS,    breath sounds clear to auscultation       Cardiovascular Exercise Tolerance: Good + Valvular Problems/Murmurs AI  Rhythm:Regular Rate:Normal  TTE 2018 - Estimated ejection fraction was in the range of 60% to 65%. Grade 1 diastolic dysfunction. Mild aortic regurgitation.     Neuro/Psych  Headaches, negative psych ROS   GI/Hepatic hiatal hernia, GERD  Controlled and Medicated,(+) Hepatitis -  Endo/Other  negative endocrine ROS  Renal/GU Renal disease  negative genitourinary   Musculoskeletal  (+) Arthritis ,   Abdominal   Peds  Hematology negative hematology ROS (+)   Anesthesia Other Findings Breast cancer  Reproductive/Obstetrics                            Anesthesia Physical  Anesthesia Plan  ASA: III  Anesthesia Plan: General   Post-op Pain Management:    Induction: Intravenous  PONV Risk Score and Plan: 4 or greater and Ondansetron, Dexamethasone, Midazolam, Scopolamine patch - Pre-op and Treatment may vary due to age or medical condition  Airway Management Planned: Oral ETT  Additional Equipment: None  Intra-op Plan:   Post-operative Plan: Extubation in OR  Informed Consent: I have reviewed the patients History and Physical, chart, labs and discussed the procedure including the risks, benefits and alternatives for the proposed anesthesia with the patient or authorized representative who has indicated his/her understanding and acceptance.   Dental advisory given  Plan Discussed with: CRNA  Anesthesia Plan Comments:         Anesthesia Quick Evaluation

## 2017-01-14 ENCOUNTER — Encounter (HOSPITAL_COMMUNITY): Payer: Self-pay | Admitting: Gynecologic Oncology

## 2017-01-17 DIAGNOSIS — Z23 Encounter for immunization: Secondary | ICD-10-CM | POA: Diagnosis not present

## 2017-01-21 MED FILL — ANASTROZOLE 1 MG TABLET: 1 | 90 days supply | Qty: 90 | Fill #2

## 2017-02-03 MED FILL — EZETIMIBE 10 MG TABS: 10 | 30 days supply | Qty: 30 | Fill #5

## 2017-02-03 MED FILL — ROSUVASTATIN CALCIUM 40 MG: 40 | 30 days supply | Qty: 30 | Fill #5

## 2017-02-09 ENCOUNTER — Ambulatory Visit: Payer: Medicare Other | Admitting: Gynecologic Oncology

## 2017-02-09 ENCOUNTER — Ambulatory Visit: Payer: Medicare Other | Attending: Gynecologic Oncology | Admitting: Gynecologic Oncology

## 2017-02-09 ENCOUNTER — Encounter: Payer: Self-pay | Admitting: Gynecologic Oncology

## 2017-02-09 VITALS — BP 122/69 | HR 70 | Temp 98.2°F | Resp 16 | Ht 60.0 in | Wt 136.6 lb

## 2017-02-09 DIAGNOSIS — M8580 Other specified disorders of bone density and structure, unspecified site: Secondary | ICD-10-CM | POA: Diagnosis not present

## 2017-02-09 DIAGNOSIS — Z90722 Acquired absence of ovaries, bilateral: Secondary | ICD-10-CM

## 2017-02-09 DIAGNOSIS — Z1502 Genetic susceptibility to malignant neoplasm of ovary: Secondary | ICD-10-CM

## 2017-02-09 DIAGNOSIS — Z7982 Long term (current) use of aspirin: Secondary | ICD-10-CM | POA: Insufficient documentation

## 2017-02-09 DIAGNOSIS — E785 Hyperlipidemia, unspecified: Secondary | ICD-10-CM | POA: Diagnosis not present

## 2017-02-09 DIAGNOSIS — Z803 Family history of malignant neoplasm of breast: Secondary | ICD-10-CM | POA: Diagnosis not present

## 2017-02-09 DIAGNOSIS — Z79899 Other long term (current) drug therapy: Secondary | ICD-10-CM | POA: Diagnosis not present

## 2017-02-09 DIAGNOSIS — C50512 Malignant neoplasm of lower-outer quadrant of left female breast: Secondary | ICD-10-CM | POA: Insufficient documentation

## 2017-02-09 DIAGNOSIS — Z1501 Genetic susceptibility to malignant neoplasm of breast: Secondary | ICD-10-CM | POA: Insufficient documentation

## 2017-02-09 DIAGNOSIS — Z853 Personal history of malignant neoplasm of breast: Secondary | ICD-10-CM | POA: Insufficient documentation

## 2017-02-09 DIAGNOSIS — Z1509 Genetic susceptibility to other malignant neoplasm: Secondary | ICD-10-CM

## 2017-02-09 DIAGNOSIS — K219 Gastro-esophageal reflux disease without esophagitis: Secondary | ICD-10-CM | POA: Insufficient documentation

## 2017-02-09 DIAGNOSIS — H25013 Cortical age-related cataract, bilateral: Secondary | ICD-10-CM | POA: Diagnosis not present

## 2017-02-09 DIAGNOSIS — Z9011 Acquired absence of right breast and nipple: Secondary | ICD-10-CM | POA: Diagnosis not present

## 2017-02-09 DIAGNOSIS — H524 Presbyopia: Secondary | ICD-10-CM | POA: Diagnosis not present

## 2017-02-09 NOTE — Patient Instructions (Signed)
You are released for all activity.  Follow-up with Dr Benay Spice as scheduled and with Dr Phineas Real annually.  Call Dr Serita Grit office at [phone] with questions.

## 2017-02-09 NOTE — Progress Notes (Signed)
Consult Note: Gyn-Onc  Consult was requested by Dr. Phineas Real for the evaluation of Cheryl [name] 66 y.o. female  CC:  Chief Complaint  Patient presents with  . BRCA2 positive    Assessment/Plan:  Cheryl [name]  is a 66 y.o.  year old with a history of an indeterminant BRCA 2 mutation and bilateral breast cancer (recently diagnosed and treated left sided lesion).  S/p BSO on 01/13/17. Normal intraop findings. Benign ovaries on permanent pathology.  Plan is for released care to OBGYN (Dr Phineas Real) and Dr Benay Spice from Medical Oncology.  HPI: Cheryl [name] is a very pleasant 66 year old G0 who is seen in consultation at the request of Dr Phineas Real for an indeterminent BRCA2 mutation in the setting of a new left sided breast cancer diagnosis.  The patient was tested in 2009 and the pannet revealed a mutation of [id number] which was considered of uncertain significance.  The patient has a personal history of right breast cancer, a mother with bilateral breast cancer and multiple great aunts with breast cancer. There is no ovarian cancer family history.  The patient is nulliparous. She is generally very healthy. Her only abdominal surgery was a TRAM flap reconstruction in 1999 at [facility] (mesh was used).   Her last abdominal US was normal in 2009.  She is a patient of Dr Gearldine Shown for breast cancer management.   She completed treatment with surgery, radiation and arimidex.  CA 125 in November, 2017 was normal at 4, and [place] in November, 2017 showed a normal uterus measuring 4.2x2.3x3.4cm and nonvisualized ovaries.  Interval Hx: On 01/13/17 she underwent robotic BSO. No intraop adhesions were noted. Hysterectomy was not performed as this was not indicated as part of risk reducing surgery for BRCA2 and her narrow vagina precluded straightforward vaginal retrieval.  Current Meds:  Outpatient Encounter Prescriptions as of 02/09/2017  Medication Sig  . anastrozole (ARIMIDEX) 1 MG tablet  Take 1 tablet (1 mg total) by mouth daily. Please give 3 month supply  . aspirin EC 81 MG tablet Take 81 mg by mouth daily.  . cephALEXin (KEFLEX) 500 MG capsule Take 500 mg by mouth 4 (four) times daily as needed. cellulitis flare up.  . Coenzyme Q10 (COQ10) 100 MG CAPS Take 100 mg by mouth daily.  Marland Kitchen esomeprazole (NEXIUM) 20 MG capsule Take 20 mg by mouth daily before breakfast.   . ezetimibe (ZETIA) 10 MG tablet Take 1 tablet (10 mg total) by mouth daily.  . fexofenadine (ALLEGRA) 180 MG tablet Take 180 mg by mouth daily as needed (for sinus/allergies.).  Marland Kitchen non-metallic deodorant (ALRA) MISC Apply 1 application topically daily as needed.  . propranolol (INDERAL LA) 80 MG 24 hr capsule Take 80 mg by mouth daily with breakfast.   . rizatriptan (MAXALT) 10 MG tablet Take 10 mg by mouth as needed. May repeat in 2 hours if needed  . rosuvastatin (CRESTOR) 40 MG tablet Take 1 tablet (40 mg total) by mouth daily.  Marland Kitchen VITAMIN D, ERGOCALCIFEROL, PO Take 1-2 drops by mouth See admin instructions. 2 drops in the morning & 1 drop in the evening   1 drop = 2000 iu  . [DISCONTINUED] oxyCODONE-acetaminophen (PERCOCET/ROXICET) 5-325 MG tablet Take 1-2 tablets by mouth every 4 (four) hours as needed for severe pain.   No facility-administered encounter medications on file as of 02/09/2017.     Allergy:  Allergies  Allergen Reactions  . Banthine [Methantheline] Anaphylaxis  . Demerol Hives  . Theophyllines Palpitations  Social Hx:   Social History   Social History  . Marital status: Single    Spouse name: N/A  . Number of children: N/A  . Years of education: N/A   Occupational History  . Nurse [employer]   Social History Main Topics  . Smoking status: Never Smoker  . Smokeless tobacco: Never Used  . Alcohol use 0.6 oz/week    1 Glasses of wine per week     Comment: VERY RARE  . Drug use: No  . Sexual activity: No     Comment:  [place]   Other Topics Concern  . Not on file    Social History Narrative  . No narrative on file    Past Surgical Hx:  Past Surgical History:  Procedure Laterality Date  . BREAST LUMPECTOMY WITH RADIOACTIVE SEED AND SENTINEL LYMPH NODE BIOPSY Left 05/07/2016   Procedure: LEFT BREAST LUMPECTOMY WITH RADIOACTIVE SEED AND LEFT AXILLARY SENTINEL LYMPH NODE BIOPSY;  Surgeon: Fanny Skates, MD;  Location: [facility];  Service: General;  Laterality: Left;  . BREAST SURGERY     Mastectomy with TRAM  . CYSTOGRAM    . FINGER SURGERY Right 01/1999   finger on the hand  . FOOT SURGERY  2004,2015  . FOOT SURGERY Left 07/2005   3 toes  . left thumb  04/2004   joint surgery bil thumbs  . MASTECTOMY    . right mastectomy  10-27-97   with tram flap  . right thumb  1/05  . ROBOTIC ASSISTED BILATERAL SALPINGO OOPHERECTOMY Bilateral 01/13/2017   Procedure: XI ROBOTIC ASSISTED BILATERAL SALPINGO OOPHORECTOMY REPAIR OF CERVICAL LACERATION;  Surgeon: Everitt Amber, MD;  Location: [facility];  Service: Gynecology;  Laterality: Bilateral;  . TONSILLECTOMY AND ADENOIDECTOMY      Past Medical Hx:  Past Medical History:  Diagnosis Date  . Arthritis   . Breast cancer ([place]) 1999   right breast, Adriamycin Therapy  . Breast cancer of lower-outer quadrant of left female breast ([place]) 05/07/2016  . Cellulitis   . Chronic acquired lymphedema    rt arm, s/p right breast cancer  . GERD (gastroesophageal reflux disease)   . Headache   . Heart murmur    hx of  . 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 History of external beam radiation therapy 06/12/16-07/28/16   left breast 50.4 Gy in 28 fractions, left breast boost 10 Gy in 5 fractions  . History of hiatal hernia   . History of kidney stones   . History of migraine   . Hyperlipidemia   . Kidney problem 1969   history of left kidney bleeding   1969  . Osteopenia 02/2015   T score -2.0. Prior DEXA 2010 T score -1.7. FRAX 20%/2.1%. Based upon prior history of Fosamax treatment will hold on treatment now with  repeat at 2 year interval for stability  . Strep throat    age 52  . Ventricular ectopy    chronic    Past Gynecological History:  G0 No LMP recorded. Patient is postmenopausal.  Family Hx:  Family History  Problem Relation Age of Onset  . Breast cancer Mother 40       dx 3 times, bilateral  . Heart disease Father        CABG  . Hypertension Father   . Dementia Father   . Hyperlipidemia Sister   . Tuberculosis Paternal Grandmother   . Heart attack Paternal Grandfather   . Breast cancer Other  MGM's sister  . Breast cancer Other        MGMs sister  . Breast cancer Other        MGMs sister  . Prostate cancer Other        MGMs father    Review of Systems:  Constitutional  Feels well,    ENT Normal appearing ears and nares bilaterally Skin/Breast  No rash, sores, jaundice, itching, dryness Cardiovascular  No chest pain, shortness of breath, or edema  Pulmonary  No cough or wheeze.  Gastro Intestinal  No nausea, vomitting, or diarrhoea. No bright red blood per rectum, no abdominal pain, change in bowel movement, or constipation.  Genito Urinary  No frequency, urgency, dysuria, no bleeding Musculo Skeletal  No myalgia, arthralgia, joint swelling or pain  Neurologic  No weakness, numbness, change in gait,  Psychology  No depression, anxiety, insomnia.   Vitals:  Blood pressure 122/69, pulse 70, temperature 98.2 F (36.8 C), temperature source Oral, resp. rate 16, height 5' (1.524 m), weight 136 lb 9.6 oz (62 kg), SpO2 99 %.  Physical Exam: WD in NAD Neck  Supple NROM, without any enlargements.  Lymph Node Survey No cervical supraclavicular or inguinal adenopathy Cardiovascular  Pulse normal rate, regularity and rhythm. S1 and S2 normal.  Lungs  Clear to auscultation bilateraly, without wheezes/crackles/rhonchi. Good air movement.  Skin  No rash/lesions/breakdown  Psychiatry  Alert and oriented to person, place, and time  Abdomen  Normoactive  bowel sounds, abdomen soft, non-tender and obese without evidence of hernia. Incisions well healed Back No CVA tenderness Genito Urinary  deferred Rectal  deferred Extremities  No bilateral cyanosis, clubbing or edema.   [name], MD  02/09/2017, 2:27 PM

## 2017-02-16 ENCOUNTER — Encounter: Payer: Medicare Other | Admitting: Gynecology

## 2017-03-10 MED FILL — PROPRANOLOL ER 80 MG CAP: 80 | 90 days supply | Qty: 90 | Fill #2

## 2017-03-17 MED FILL — ROSUVASTATIN CALCIUM 40 MG: 40 | 30 days supply | Qty: 30 | Fill #6

## 2017-03-17 MED FILL — EZETIMIBE 10 MG TABS: 10 | 30 days supply | Qty: 30 | Fill #6

## 2017-03-19 ENCOUNTER — Other Ambulatory Visit: Payer: Self-pay | Admitting: General Surgery

## 2017-03-19 DIAGNOSIS — Z853 Personal history of malignant neoplasm of breast: Secondary | ICD-10-CM

## 2017-03-24 ENCOUNTER — Other Ambulatory Visit: Payer: Self-pay | Admitting: Gynecology

## 2017-03-24 ENCOUNTER — Ambulatory Visit (INDEPENDENT_AMBULATORY_CARE_PROVIDER_SITE_OTHER): Payer: Medicare Other

## 2017-03-24 ENCOUNTER — Telehealth: Payer: Self-pay | Admitting: Gynecology

## 2017-03-24 ENCOUNTER — Encounter: Payer: Self-pay | Admitting: Gynecology

## 2017-03-24 DIAGNOSIS — Z780 Asymptomatic menopausal state: Secondary | ICD-10-CM

## 2017-03-24 DIAGNOSIS — M8589 Other specified disorders of bone density and structure, multiple sites: Secondary | ICD-10-CM

## 2017-03-24 DIAGNOSIS — M8580 Other specified disorders of bone density and structure, unspecified site: Secondary | ICD-10-CM

## 2017-03-24 NOTE — Telephone Encounter (Signed)
Tell patient her most recent bone density overall is stable from her prior study.  At this point I would suggest continue to monitor and repeat the bone density in 2 years.

## 2017-03-25 NOTE — Telephone Encounter (Signed)
Pt informed,

## 2017-03-26 DIAGNOSIS — Z6825 Body mass index (BMI) 25.0-25.9, adult: Secondary | ICD-10-CM | POA: Diagnosis not present

## 2017-03-26 DIAGNOSIS — I890 Lymphedema, not elsewhere classified: Secondary | ICD-10-CM | POA: Diagnosis not present

## 2017-03-26 DIAGNOSIS — M859 Disorder of bone density and structure, unspecified: Secondary | ICD-10-CM | POA: Diagnosis not present

## 2017-03-26 DIAGNOSIS — E7849 Other hyperlipidemia: Secondary | ICD-10-CM | POA: Diagnosis not present

## 2017-03-26 DIAGNOSIS — G43909 Migraine, unspecified, not intractable, without status migrainosus: Secondary | ICD-10-CM | POA: Diagnosis not present

## 2017-03-26 DIAGNOSIS — E663 Overweight: Secondary | ICD-10-CM | POA: Diagnosis not present

## 2017-03-26 DIAGNOSIS — M1990 Unspecified osteoarthritis, unspecified site: Secondary | ICD-10-CM | POA: Diagnosis not present

## 2017-03-26 DIAGNOSIS — C50911 Malignant neoplasm of unspecified site of right female breast: Secondary | ICD-10-CM | POA: Diagnosis not present

## 2017-04-09 ENCOUNTER — Ambulatory Visit (HOSPITAL_BASED_OUTPATIENT_CLINIC_OR_DEPARTMENT_OTHER): Payer: Medicare Other | Admitting: Oncology

## 2017-04-09 ENCOUNTER — Telehealth: Payer: Self-pay | Admitting: Oncology

## 2017-04-09 VITALS — BP 118/67 | HR 69 | Temp 98.5°F | Resp 17 | Ht 60.0 in | Wt 134.5 lb

## 2017-04-09 DIAGNOSIS — Z79811 Long term (current) use of aromatase inhibitors: Secondary | ICD-10-CM | POA: Diagnosis not present

## 2017-04-09 DIAGNOSIS — Z170 Estrogen receptor positive status [ER+]: Secondary | ICD-10-CM | POA: Diagnosis not present

## 2017-04-09 DIAGNOSIS — C50512 Malignant neoplasm of lower-outer quadrant of left female breast: Secondary | ICD-10-CM

## 2017-04-09 DIAGNOSIS — M8580 Other specified disorders of bone density and structure, unspecified site: Secondary | ICD-10-CM

## 2017-04-09 NOTE — Telephone Encounter (Signed)
Gave avs and calendar for July 2019

## 2017-04-09 NOTE — Progress Notes (Signed)
  [facility] OFFICE PROGRESS NOTE   Diagnosis: Breast cancer  INTERVAL HISTORY:   Ms. Boller returns as scheduled.  She continues on Arimidex.  She reports vague upper abdominal discomfort that she describes as a hunger pain.  This occurred when she took tamoxifen and Femara.  No associated symptoms.  No other complaint. She continues to have right arm lymphedema.  She has intermittent episodes of cellulitis treated with Keflex.  She underwent prophylactic bilateral oophorectomy on 01/13/2017.  The pathology was negative for malignancy. A bone density scan on 03/24/2017 was stable. Objective:  Vital signs in last 24 hours:  Blood pressure 118/67, pulse 69, temperature 98.5 F (36.9 C), temperature source Oral, resp. rate 17, height 5' (1.524 m), weight 134 lb 8 oz (61 kg), SpO2 100 %.    HEENT: Neck without mass Lymphatics: No cervical, supraclavicular, or axillary nodes Resp: Lungs clear bilaterally Cardio: Regular rate and rhythm GI: No hepatomegaly, no mass, nontender Vascular: Mild edema of the right arm and hand with a lymphedema sleeve in place Chest: Status post right mastectomy with a TRAM reconstruction.  Firm tissue at the medial aspect of the TRAM.  Left breast without mass.    Medications: I have reviewed the patient's current medications.   Assessment/Plan: 1.Stage III right-sided breast cancer diagnosed in 1999. She remains in clinical remission. She completed adjuvant Femara therapy at the end of August 2009.  2. Left renal lesion on CT scan in April 2008 - a renal ultrasound confirmed bilateral renal cysts.  3. Osteopenia - she continues vitamin D.  4. 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  5. BRCA2 variant of unclear clinical significance, with a significant family history of breast cancer. Negative breast/ovarian gene panel 06/15/2015 6. History of hematuria - reports being diagnosed with a renal stone by Dr Diona Fanti.    7. Left breast cancer-invasive lobular carcinoma, HER-2 negative, ER positive, PR positive, Ki-20 percent confirmed on a core biopsy of a left lower outer quadrant mass on 03/10/2016  Left lumpectomy an sentinel lymph node biopsy01/17/2018,1.5 cm lobular carcinoma, grade 2,pT1c,pN0  Oncotype-score 17, low risk  Initiation of adjuvant left breast radiation 06/12/2016, completed 07/28/2016  Initiation of adjuvant Arimidex  07/28/2016   Disposition: Ms. Eckman appears stable.  She will continue Arimidex.  She is scheduled to see Dr. Dalbert Batman in January.  She will return for an office visit here in July 2019.  She will be referred for a left breast mammogram in January.  She will continue Keflex as needed for the right arm/chest wall cellulitis.  15 minutes were spent with the patient today.  The majority of the time was used for counseling and coordination of care.  Betsy Coder, MD  04/09/2017  8:26 AM

## 2017-04-17 ENCOUNTER — Ambulatory Visit
Admission: RE | Admit: 2017-04-17 | Discharge: 2017-04-17 | Disposition: A | Discharge status: Home / Self Care | Payer: Medicare Other | Source: Ambulatory Visit | Attending: General Surgery | Admitting: General Surgery

## 2017-04-17 DIAGNOSIS — Z853 Personal history of malignant neoplasm of breast: Secondary | ICD-10-CM

## 2017-04-17 DIAGNOSIS — R922 Inconclusive mammogram: Secondary | ICD-10-CM | POA: Diagnosis not present

## 2017-04-17 HISTORY — DX: Personal history of irradiation: Z92.3

## 2017-04-22 MED FILL — ROSUVASTATIN CALCIUM 40 MG: 40 | 30 days supply | Qty: 30 | Fill #7

## 2017-04-22 MED FILL — ANASTROZOLE 1 MG TABLET: 1 | 90 days supply | Qty: 90 | Fill #3

## 2017-04-22 MED FILL — EZETIMIBE 10 MG TABS: 10 | 30 days supply | Qty: 30 | Fill #7

## 2017-04-23 DIAGNOSIS — I972 Postmastectomy lymphedema syndrome: Secondary | ICD-10-CM | POA: Diagnosis not present

## 2017-04-23 DIAGNOSIS — Z803 Family history of malignant neoplasm of breast: Secondary | ICD-10-CM | POA: Diagnosis not present

## 2017-04-23 DIAGNOSIS — Z9011 Acquired absence of right breast and nipple: Secondary | ICD-10-CM | POA: Diagnosis not present

## 2017-04-23 DIAGNOSIS — C50512 Malignant neoplasm of lower-outer quadrant of left female breast: Secondary | ICD-10-CM | POA: Diagnosis not present

## 2017-04-23 DIAGNOSIS — Z87442 Personal history of urinary calculi: Secondary | ICD-10-CM | POA: Diagnosis not present

## 2017-05-27 MED FILL — CEPHALEXIN 500 MG CAPSULE: 500 | 14 days supply | Qty: 56 | Fill #0

## 2017-06-05 DIAGNOSIS — N200 Calculus of kidney: Secondary | ICD-10-CM | POA: Diagnosis not present

## 2017-06-05 DIAGNOSIS — N281 Cyst of kidney, acquired: Secondary | ICD-10-CM | POA: Diagnosis not present

## 2017-06-08 MED FILL — PROPRANOLOL ER 80 MG CAP: 80 | 90 days supply | Qty: 90 | Fill #3

## 2017-06-08 MED FILL — EZETIMIBE 10 MG TABS: 10 | 30 days supply | Qty: 30 | Fill #8

## 2017-06-08 MED FILL — ROSUVASTATIN CALCIUM 40 MG: 40 | 30 days supply | Qty: 30 | Fill #8

## 2017-06-11 DIAGNOSIS — N281 Cyst of kidney, acquired: Secondary | ICD-10-CM | POA: Diagnosis not present

## 2017-06-11 DIAGNOSIS — N200 Calculus of kidney: Secondary | ICD-10-CM | POA: Diagnosis not present

## 2017-06-19 ENCOUNTER — Other Ambulatory Visit: Payer: Self-pay | Admitting: Urology

## 2017-06-19 DIAGNOSIS — N281 Cyst of kidney, acquired: Secondary | ICD-10-CM

## 2017-06-25 ENCOUNTER — Ambulatory Visit (HOSPITAL_COMMUNITY)
Admission: RE | Admit: 2017-06-25 | Discharge: 2017-06-25 | Disposition: A | Discharge status: Home / Self Care | Payer: Medicare Other | Source: Ambulatory Visit | Attending: Urology | Admitting: Urology

## 2017-06-25 DIAGNOSIS — K7689 Other specified diseases of liver: Secondary | ICD-10-CM | POA: Diagnosis not present

## 2017-06-25 DIAGNOSIS — K449 Diaphragmatic hernia without obstruction or gangrene: Secondary | ICD-10-CM | POA: Insufficient documentation

## 2017-06-25 DIAGNOSIS — K8689 Other specified diseases of pancreas: Secondary | ICD-10-CM | POA: Insufficient documentation

## 2017-06-25 DIAGNOSIS — N281 Cyst of kidney, acquired: Secondary | ICD-10-CM

## 2017-06-25 DIAGNOSIS — N2889 Other specified disorders of kidney and ureter: Secondary | ICD-10-CM | POA: Diagnosis not present

## 2017-06-25 MED ORDER — GADOBENATE DIMEGLUMINE 529 MG/ML IV SOLN
15.0000 mL | Freq: Once | INTRAVENOUS | Status: AC | PRN
  Administered 2017-06-25: 13 mL via INTRAVENOUS

## 2017-07-07 MED FILL — EZETIMIBE 10 MG TABS: 10 | 30 days supply | Qty: 30 | Fill #9

## 2017-07-07 MED FILL — ROSUVASTATIN CALCIUM 40 MG: 40 | 30 days supply | Qty: 30 | Fill #9

## 2017-07-08 MED FILL — RIZATRIPTAN 10 MG ODT: 10 | 30 days supply | Qty: 12 | Fill #0

## 2017-07-21 ENCOUNTER — Other Ambulatory Visit: Payer: Self-pay | Admitting: Oncology

## 2017-07-22 MED FILL — ANASTROZOLE 1 MG TABLET: 1 | 90 days supply | Qty: 90 | Fill #0

## 2017-08-17 MED FILL — EZETIMIBE 10 MG TABLET: 10 | 30 days supply | Qty: 30 | Fill #10

## 2017-08-17 MED FILL — ROSUVASTATIN CALCIUM 40 MG: 40 | 30 days supply | Qty: 30 | Fill #10

## 2017-08-25 NOTE — Progress Notes (Signed)
 Cardiology Office Note    Date:  08/27/2017   ID:  Cheryl [name], DOB 05/16/1950, MRN [id number]  PCP:  Aronson, Richard, MD  Cardiologist:  Peter Jordan, MD    History of Present Illness:  Cheryl [name] is a 66 y.o. female seen for follow up. She has a  history of PVCs and hyperlipidemia.  She had normal stress Echos done in 2009 and 2012. Repeat Echo in 2015 was normal. She has a history of PVCs.   She is s/p mastectomy for breast CA and does have a  history of Adriamycin use.    She has chronic lymphedema in her right arm.  She has only rare palpitations on propranolol which she takes for migraine HAs.   She was diagnosed with left breast CA and underwent lumpectomy in January 2018 and has completed RT. She is now on hormonal therapy. She underwent bilateral BSO in September 2018 due to positive BRCA2 gene mutation. She has a strong family history of heart disease She remains very active walking, doing yoga, and her own housework and mowing her grass. No chest pain or SOB. No edema. No palpitations. Feels well.     Past Medical History:  Diagnosis Date  . Arthritis   . Breast cancer ([facility]) 1999   right breast, Adriamycin Therapy  . Breast cancer ([facility]) 2017   left breast  . Breast cancer of lower-outer quadrant of left female breast ([facility]) 05/07/2016  . Cellulitis   . Chronic acquired lymphedema    rt arm, s/p right breast cancer  . GERD (gastroesophageal reflux disease)   . Headache   . Heart murmur    hx of  . 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 History of external beam radiation therapy 06/12/16-07/28/16   left breast 50.4 Gy in 28 fractions, left breast boost 10 Gy in 5 fractions  . History of hiatal hernia   . History of kidney stones   . History of migraine   . Hyperlipidemia   . Kidney problem 1969   history of left kidney bleeding   1969  . Osteopenia Valve/2018   T score -2.2 FRAX recognizing prior Fosamax use 10% / 1.6% overall stable from prior DEXA  .  Personal history of radiation therapy 2018  . Personal history of radiation therapy 1999  . Strep throat    age 8  . Ventricular ectopy    chronic    Past Surgical History:  Procedure Laterality Date  . BREAST LUMPECTOMY Left 04/2016  . BREAST LUMPECTOMY WITH RADIOACTIVE SEED AND SENTINEL LYMPH NODE BIOPSY Left 05/07/2016   Procedure: LEFT BREAST LUMPECTOMY WITH RADIOACTIVE SEED AND LEFT AXILLARY SENTINEL LYMPH NODE BIOPSY;  Surgeon: Haywood Ingram, MD;  Location: [facility];  Service: General;  Laterality: Left;  . BREAST SURGERY     Mastectomy with TRAM  . CYSTOGRAM    . FINGER SURGERY Right 01/1999   finger on the hand  . FOOT SURGERY  2004,2015  . FOOT SURGERY Left 07/2005   3 toes  . left thumb  04/2004   joint surgery bil thumbs  . MASTECTOMY Right 1999   TRAM  . right mastectomy  10-27-97   with tram flap  . right thumb  1/05  . ROBOTIC ASSISTED BILATERAL SALPINGO OOPHERECTOMY Bilateral 01/13/2017   Procedure: XI ROBOTIC ASSISTED BILATERAL SALPINGO OOPHORECTOMY REPAIR OF CERVICAL LACERATION;  Surgeon: Rossi, Emma, MD;  Location: [facility];  Service: Gynecology;  Laterality: Bilateral;  . TONSILLECTOMY AND   ADENOIDECTOMY      Current Medications: Outpatient Medications Prior to Visit  Medication Sig Dispense Refill  . anastrozole (ARIMIDEX) 1 MG tablet TAKE 1 TABLET BY MOUTH DAILY 90 tablet 3  . aspirin EC 81 MG tablet Take 81 mg by mouth 3 (three) times a week.     . cephALEXin (KEFLEX) 500 MG capsule Take 500 mg by mouth 4 (four) times daily as needed. cellulitis flare up.    . Coenzyme Q10 (COQ10) 100 MG CAPS Take 100 mg by mouth daily.    Marland Kitchen esomeprazole (NEXIUM) 20 MG capsule Take 20 mg by mouth daily before breakfast.     . ezetimibe (ZETIA) 10 MG tablet Take 1 tablet (10 mg total) by mouth daily. 90 tablet 3  . fexofenadine (ALLEGRA) 180 MG tablet Take 180 mg by mouth daily as needed (for sinus/allergies.).    Marland Kitchen propranolol (INDERAL LA) 80 MG 24 hr capsule Take 80 mg by  mouth daily with breakfast.     . rizatriptan (MAXALT) 10 MG tablet Take 10 mg by mouth as needed. May repeat in 2 hours if needed    . rosuvastatin (CRESTOR) 40 MG tablet Take 1 tablet (40 mg total) by mouth daily. 90 tablet 3  . VITAMIN D, ERGOCALCIFEROL, PO Take 1-2 drops by mouth See admin instructions. 2 drops in the morning & 1 drop in the evening   1 drop = 2000 iu     No facility-administered medications prior to visit.      Allergies:   Banthine [methantheline]; Demerol; and Theophyllines   Social History   Socioeconomic History  . Marital status: Single    Spouse name: Not on file  . Number of children: Not on file  . Years of education: Not on file  . Highest education level: Not on file  Occupational History  . Occupation: Optician, dispensing: [employer]  . Financial resource strain: Not on file  . Food insecurity:    Worry: Not on file    Inability: Not on file  . Transportation needs:    Medical: Not on file    Non-medical: Not on file  Tobacco Use  . Smoking status: Never Smoker  . Smokeless tobacco: Never Used  Substance and Sexual Activity  . Alcohol use: Yes    Alcohol/week: 0.6 oz    Types: 1 Glasses of wine per week    Comment: VERY RARE  . Drug use: No  . Sexual activity: Never    Comment:  [place]  Lifestyle  . Physical activity:    Days per week: Not on file    Minutes per session: Not on file  . Stress: Not on file  Relationships  . Social connections:    Talks on phone: Not on file    Gets together: Not on file    Attends religious service: Not on file    Active member of club or organization: Not on file    Attends meetings of clubs or organizations: Not on file    Relationship status: Not on file  Other Topics Concern  . Not on file  Social History Narrative  . Not on file     Family History:  The patient's family history includes Breast cancer in her other, other, and other; Breast cancer (age of onset: 68) in her  mother; Dementia in her father; Heart attack in her paternal grandfather; Heart disease in her father; Hyperlipidemia in her sister; Hypertension in her  father; Prostate cancer in her other; Tuberculosis in her paternal grandmother.   ROS:   Please see the history of present illness.    ROS All other systems reviewed and are negative.   PHYSICAL EXAM:   VS:  BP (!) 106/56   Pulse 77   Ht 5' (1.524 m)   Wt 139 lb 12.8 oz (63.4 kg)   BMI 27.30 kg/m    GENERAL:  Well appearing HEENT:  PERRL, EOMI, sclera are clear. Oropharynx is clear. NECK:  No jugular venous distention, carotid upstroke brisk and symmetric, no bruits, no thyromegaly or adenopathy LUNGS:  Clear to auscultation bilaterally CHEST:  Unremarkable HEART:  RRR,  PMI not displaced or sustained,S1 and S2 within normal limits, no S3, no S4: no clicks, no rubs, no murmurs ABD:  Soft, nontender. BS +, no masses or bruits. No hepatomegaly, no splenomegaly EXT:  2 + pulses throughout, no edema, no cyanosis no clubbing, has a compression sleeve on right arm. SKIN:  Warm and dry.  No rashes NEURO:  Alert and oriented x 3. Cranial nerves II through XII intact. PSYCH:  Cognitively intact    Wt Readings from Last 3 Encounters:  08/27/17 139 lb 12.8 oz (63.4 kg)  04/09/17 134 lb 8 oz (61 kg)  02/09/17 136 lb 9.6 oz (62 kg)      Studies/Labs Reviewed:   EKG:  EKG is ordered today.  It demonstrates NSR with LAD and LVH. I have personally reviewed and interpreted this study.   Recent Labs: 01/09/2017: ALT 25; BUN 16; Creatinine, Ser 0.65; Hemoglobin 12.3; Platelets 152; Potassium 4.2; Sodium 139   Lipid Panel    Component Value Date/Time   CHOL 168 04/18/2014 0852   TRIG 132.0 04/18/2014 0852   HDL 62.60 04/18/2014 0852   CHOLHDL 3 04/18/2014 0852   VLDL 26.4 04/18/2014 0852   LDLCALC 79 04/18/2014 0852   Labs dated 08/18/15: cholesterol 193, triglycerides 118, HDL 59, LDL 110. TSH normal Dated 09/03/16: cholesterol 184,  triglycerides 101, HDL 67, LDL 97.   Additional studies/ records that were reviewed today include:  Echo 5/14/18Study Conclusions  - Left ventricle: The cavity size was normal. Systolic function was   normal. The estimated ejection fraction was in the range of 60%   to 65%. Wall motion was normal; there were no regional wall   motion abnormalities. Doppler parameters are consistent with   abnormal left ventricular relaxation (grade 1 diastolic   dysfunction). Doppler parameters are consistent with mildly   elevated ventricular end-diastolic filling pressure. - Aortic valve: There was mild regurgitation. - Aortic root: The aortic root was normal in size. - Right ventricle: The cavity size was normal. Wall thickness was   normal. Systolic function was normal. - Tricuspid valve: There was no regurgitation. - Pulmonary arteries: Systolic pressure was within the normal   range. - Inferior vena cava: The vessel was normal in size. - Pericardium, extracardiac: There was no pericardial effusion.  Impressions:  - Global longitudinal strain: -23%, unchanged from prior on   11/21/2013.   Lateral S prime: 11 cm/sec.  PLAN:  In order of problems listed above:  1. She is on appropriate therapy with Crestor and Zetia. Labs followed by Dr. Reynaldo Minium and she is due for repeat labs next month.  2. Asymptomatic 3. Patient has a history of Breast CA and is s/p chemoRx. She has also had RT x 2. Echo last year looked good.  Follow up in one year.  Medication Adjustments/Labs and Tests Ordered: Current medicines are reviewed at length with the patient today.  Concerns regarding medicines are outlined above.  Medication changes, Labs and Tests ordered today are listed in the Patient Instructions below. Patient Instructions  Continued your current therapy  I will see you in one year.    Signed, Peter Martinique, MD  08/27/2017 11:23 AM    [facility] [address]  [place], [place], [address] [phone]

## 2017-08-27 ENCOUNTER — Encounter: Payer: Self-pay | Admitting: Cardiology

## 2017-08-27 ENCOUNTER — Ambulatory Visit (INDEPENDENT_AMBULATORY_CARE_PROVIDER_SITE_OTHER): Payer: Medicare Other | Admitting: Cardiology

## 2017-08-27 VITALS — BP 106/56 | HR 77 | Ht 60.0 in | Wt 139.8 lb

## 2017-08-27 DIAGNOSIS — I493 Ventricular premature depolarization: Secondary | ICD-10-CM

## 2017-08-27 DIAGNOSIS — E7800 Pure hypercholesterolemia, unspecified: Secondary | ICD-10-CM

## 2017-08-27 NOTE — Patient Instructions (Signed)
Continued your current therapy  I will see you in one year.

## 2017-09-07 MED FILL — PROPRANOLOL ER 80 MG CAP: 80 | 90 days supply | Qty: 90 | Fill #0

## 2017-09-10 DIAGNOSIS — M859 Disorder of bone density and structure, unspecified: Secondary | ICD-10-CM | POA: Diagnosis not present

## 2017-09-10 DIAGNOSIS — E7849 Other hyperlipidemia: Secondary | ICD-10-CM | POA: Diagnosis not present

## 2017-09-12 ENCOUNTER — Encounter (HOSPITAL_COMMUNITY): Payer: Self-pay | Admitting: Oncology

## 2017-09-17 DIAGNOSIS — Z0000 Encounter for general adult medical examination without abnormal findings: Secondary | ICD-10-CM | POA: Diagnosis not present

## 2017-09-17 DIAGNOSIS — I890 Lymphedema, not elsewhere classified: Secondary | ICD-10-CM | POA: Diagnosis not present

## 2017-09-17 DIAGNOSIS — G43909 Migraine, unspecified, not intractable, without status migrainosus: Secondary | ICD-10-CM | POA: Diagnosis not present

## 2017-09-17 DIAGNOSIS — Z6826 Body mass index (BMI) 26.0-26.9, adult: Secondary | ICD-10-CM | POA: Diagnosis not present

## 2017-09-17 DIAGNOSIS — E7849 Other hyperlipidemia: Secondary | ICD-10-CM | POA: Diagnosis not present

## 2017-09-17 DIAGNOSIS — E663 Overweight: Secondary | ICD-10-CM | POA: Diagnosis not present

## 2017-09-17 DIAGNOSIS — M859 Disorder of bone density and structure, unspecified: Secondary | ICD-10-CM | POA: Diagnosis not present

## 2017-09-17 DIAGNOSIS — C50911 Malignant neoplasm of unspecified site of right female breast: Secondary | ICD-10-CM | POA: Diagnosis not present

## 2017-09-17 DIAGNOSIS — Z1389 Encounter for screening for other disorder: Secondary | ICD-10-CM | POA: Diagnosis not present

## 2017-09-17 DIAGNOSIS — M1990 Unspecified osteoarthritis, unspecified site: Secondary | ICD-10-CM | POA: Diagnosis not present

## 2017-09-18 MED FILL — SHINGRIX VIAL KIT: 50 | 1 days supply | Qty: 1 | Fill #0

## 2017-09-22 DIAGNOSIS — Z1212 Encounter for screening for malignant neoplasm of rectum: Secondary | ICD-10-CM | POA: Diagnosis not present

## 2017-09-23 ENCOUNTER — Telehealth: Payer: Self-pay | Admitting: Oncology

## 2017-09-23 NOTE — Telephone Encounter (Signed)
Spoke w/ pt.  ([facility] PAL)  Resch GS appt from 7/19 to 7/23 @ 8 am.

## 2017-09-25 MED FILL — CEPHALEXIN 500 MG CAPSULE: 500 | 14 days supply | Qty: 56 | Fill #0

## 2017-10-01 ENCOUNTER — Other Ambulatory Visit: Payer: Self-pay | Admitting: Cardiology

## 2017-10-01 MED FILL — ROSUVASTATIN CALCIUM 40 MG: 40 | 90 days supply | Qty: 90 | Fill #0

## 2017-10-01 MED FILL — EZETIMIBE 10 MG TABLET: 10 | 90 days supply | Qty: 90 | Fill #0

## 2017-10-01 NOTE — Telephone Encounter (Signed)
REFILL 

## 2017-10-23 MED FILL — ANASTROZOLE 1 MG TABLET: 1 | 90 days supply | Qty: 90 | Fill #1

## 2017-11-05 ENCOUNTER — Encounter: Payer: Self-pay | Admitting: Gynecology

## 2017-11-06 ENCOUNTER — Ambulatory Visit: Payer: Medicare Other | Admitting: Oncology

## 2017-11-10 ENCOUNTER — Inpatient Hospital Stay: Payer: Medicare Other | Attending: Oncology | Admitting: Oncology

## 2017-11-10 VITALS — BP 133/92 | HR 61 | Temp 98.3°F | Resp 18 | Ht 60.0 in | Wt 136.3 lb

## 2017-11-10 DIAGNOSIS — Z853 Personal history of malignant neoplasm of breast: Secondary | ICD-10-CM | POA: Diagnosis not present

## 2017-11-10 DIAGNOSIS — C50512 Malignant neoplasm of lower-outer quadrant of left female breast: Secondary | ICD-10-CM

## 2017-11-10 DIAGNOSIS — Z170 Estrogen receptor positive status [ER+]: Secondary | ICD-10-CM | POA: Diagnosis not present

## 2017-11-10 DIAGNOSIS — Z9223 Personal history of estrogen therapy: Secondary | ICD-10-CM | POA: Diagnosis not present

## 2017-11-10 DIAGNOSIS — Z79899 Other long term (current) drug therapy: Secondary | ICD-10-CM | POA: Insufficient documentation

## 2017-11-10 DIAGNOSIS — N281 Cyst of kidney, acquired: Secondary | ICD-10-CM | POA: Insufficient documentation

## 2017-11-10 DIAGNOSIS — I890 Lymphedema, not elsewhere classified: Secondary | ICD-10-CM | POA: Diagnosis not present

## 2017-11-10 DIAGNOSIS — M8580 Other specified disorders of bone density and structure, unspecified site: Secondary | ICD-10-CM | POA: Diagnosis not present

## 2017-11-10 NOTE — Progress Notes (Addendum)
Cheryl [name] OFFICE PROGRESS NOTE   Diagnosis: Breast cancer  INTERVAL HISTORY:   Cheryl [name] returns as scheduled.  She feels well.  She is taking arimidex.  No change over the chest wall.  No arthralgias.  She has a hot feeling in the subxiphoid area intermittently.  This feels like the sensation she experienced while taking tamoxifen. She is followed by Dr. Diona Fanti for a renal cyst.  A CT of the abdomen/pelvis on 06/11/2017 revealed bilateral renal cyst.  A posterior lesion in the left mid kidney was felt to potentially have internal enhancement and a renal protocol MRI was recommended.  The MRI on 06/25/2017 revealed changes compatible with a Bosniak category 2 lesion.  Small cystic lesions were noted in the pancreas measuring up to 6 mm.  A 61-monthfollow-up MRI was recommended.  Objective:  Vital signs in last 24 hours:  Blood pressure (!) 133/92, pulse 61, temperature 98.3 F (36.8 C), temperature source Oral, resp. rate 18, height 5' (1.524 m), weight 136 lb 4.8 oz (61.8 kg), SpO2 100 %.    HEENT: Neck without mass Lymphatics: No cervical, supraclavicular, or axillary nodes Resp: Lungs clear bilaterally Cardio: Regular rate and rhythm GI: No hepatomegaly, nontender, no mass Vascular: No leg edema.  Mild edema of the right arm with a lymphedema sleeve in place Breast: Status post right mastectomy with a TRAM reconstruction.  Firm tissue at the medial aspect of the TRAM.  Left breast without mass.   Lab Results:  Lab Results  Component Value Date   WBC 4.2 01/09/2017   HGB 12.3 01/09/2017   HCT 35.0 (L) 01/09/2017   MCV 89.3 01/09/2017   PLT 152 01/09/2017   NEUTROABS 4.1 04/29/2016    CMP  Lab Results  Component Value Date   NA 139 01/09/2017   K 4.2 01/09/2017   CL 104 01/09/2017   CO2 27 01/09/2017   GLUCOSE 123 (H) 01/09/2017   BUN 16 01/09/2017   CREATININE 0.65 01/09/2017   CALCIUM 9.5 01/09/2017   PROT 7.2 01/09/2017   ALBUMIN 4.4  01/09/2017   AST 28 01/09/2017   ALT 25 01/09/2017   ALKPHOS 73 01/09/2017   BILITOT 1.1 01/09/2017   GFRNONAA >60 01/09/2017   GFRAA >60 01/09/2017    No results found for: CEA1  No results found for: INR  Imaging:  No results found.  Medications: I have reviewed the patient's current medications.   Assessment/Plan: 1.Stage III right-sided breast cancer diagnosed in 1999. She remains in clinical remission. She completed adjuvant Femara therapy at the end of August 2009.  2. Left renal lesion on CT scan in April 2008 - a renal ultrasound confirmed bilateral renal cysts. Followed by Dr. DDiona Fantiwith repeat imaging to follow multiple renal cysts 3. Osteopenia - she continues vitamin D.  4. 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  5. BRCA2 variant of unclear clinical significance, with a significant family history of breast cancer. Negative breast/ovarian gene panel 06/15/2015 6. History of hematuria - reports being diagnosed with a renal stone by Dr DDiona Fanti  7. Left breast cancer-invasive lobular carcinoma, HER-2 negative, ER positive, PR positive, Ki-20 percent confirmed on a core biopsy of a left lower outer quadrant mass on 03/10/2016  Left lumpectomy an sentinel lymph node biopsy01/17/2018,1.5 cm lobular carcinoma, grade 2,pT1c,pN0  Oncotype-score 17, low risk  Initiation of adjuvant left breast radiation 06/12/2016, completed 07/28/2016  Initiation of adjuvantArimidex 07/28/2016  8.  Small pancreas cysts noted on MRI  06/25/2017- 2-year follow-up MRI recommended  9.   Left pleural thickening at the major fissure noted on the [facility] CT 06/11/2017-I reviewed the CT images and radiology, this area of thickening was present on a CT from 2008 and is slightly "thicker ".  Low clinical suspicion for malignancy.  Disposition: Cheryl [name] is in clinical remission from breast cancer.  She will continue Arimidex.  She will schedule a left mammogram  for December 2018.  She will return for an office visit in 1 year.  She will follow-up with Dr. Watt Climes to discuss repeat of the pancreas cysts.  Betsy Coder, MD  11/10/2017  8:25 AM

## 2017-12-08 MED FILL — PROPRANOLOL ER 80 MG CAP: 80 | 90 days supply | Qty: 90 | Fill #1

## 2018-01-02 DIAGNOSIS — Z23 Encounter for immunization: Secondary | ICD-10-CM | POA: Diagnosis not present

## 2018-01-08 MED FILL — SHINGRIX VIAL KIT: 50 | 1 days supply | Qty: 1 | Fill #1

## 2018-01-19 MED FILL — ANASTROZOLE 1 MG TABLET: 1 | 90 days supply | Qty: 90 | Fill #2

## 2018-01-25 MED FILL — EZETIMIBE 10 MG TABLET: 10 | 90 days supply | Qty: 90 | Fill #1

## 2018-01-25 MED FILL — ROSUVASTATIN CALCIUM 40 MG: 40 | 90 days supply | Qty: 90 | Fill #1

## 2018-02-11 DIAGNOSIS — H52203 Unspecified astigmatism, bilateral: Secondary | ICD-10-CM | POA: Diagnosis not present

## 2018-02-11 DIAGNOSIS — H2513 Age-related nuclear cataract, bilateral: Secondary | ICD-10-CM | POA: Diagnosis not present

## 2018-03-08 ENCOUNTER — Other Ambulatory Visit: Payer: Self-pay | Admitting: Oncology

## 2018-03-08 DIAGNOSIS — Z853 Personal history of malignant neoplasm of breast: Secondary | ICD-10-CM

## 2018-03-08 MED FILL — PROPRANOLOL ER 80 MG CAP: 80 | 90 days supply | Qty: 90 | Fill #0

## 2018-03-29 DIAGNOSIS — C50911 Malignant neoplasm of unspecified site of right female breast: Secondary | ICD-10-CM | POA: Diagnosis not present

## 2018-03-29 DIAGNOSIS — Z6826 Body mass index (BMI) 26.0-26.9, adult: Secondary | ICD-10-CM | POA: Diagnosis not present

## 2018-03-29 DIAGNOSIS — E7849 Other hyperlipidemia: Secondary | ICD-10-CM | POA: Diagnosis not present

## 2018-03-29 DIAGNOSIS — M859 Disorder of bone density and structure, unspecified: Secondary | ICD-10-CM | POA: Diagnosis not present

## 2018-03-29 DIAGNOSIS — M1990 Unspecified osteoarthritis, unspecified site: Secondary | ICD-10-CM | POA: Diagnosis not present

## 2018-03-29 DIAGNOSIS — E663 Overweight: Secondary | ICD-10-CM | POA: Diagnosis not present

## 2018-03-29 DIAGNOSIS — I890 Lymphedema, not elsewhere classified: Secondary | ICD-10-CM | POA: Diagnosis not present

## 2018-03-29 DIAGNOSIS — G43909 Migraine, unspecified, not intractable, without status migrainosus: Secondary | ICD-10-CM | POA: Diagnosis not present

## 2018-04-19 ENCOUNTER — Ambulatory Visit
Admission: RE | Admit: 2018-04-19 | Discharge: 2018-04-19 | Disposition: A | Discharge status: Home / Self Care | Payer: Medicare Other | Source: Ambulatory Visit | Attending: Oncology | Admitting: Oncology

## 2018-04-19 DIAGNOSIS — C50912 Malignant neoplasm of unspecified site of left female breast: Secondary | ICD-10-CM | POA: Diagnosis not present

## 2018-04-19 DIAGNOSIS — R928 Other abnormal and inconclusive findings on diagnostic imaging of breast: Secondary | ICD-10-CM | POA: Diagnosis not present

## 2018-04-19 DIAGNOSIS — Z853 Personal history of malignant neoplasm of breast: Secondary | ICD-10-CM

## 2018-04-19 DIAGNOSIS — Z803 Family history of malignant neoplasm of breast: Secondary | ICD-10-CM | POA: Diagnosis not present

## 2018-04-19 MED FILL — ANASTROZOLE 1 MG TABLET: 1 | 90 days supply | Qty: 90 | Fill #3

## 2018-05-18 DIAGNOSIS — I972 Postmastectomy lymphedema syndrome: Secondary | ICD-10-CM | POA: Diagnosis not present

## 2018-05-18 DIAGNOSIS — Z87442 Personal history of urinary calculi: Secondary | ICD-10-CM | POA: Diagnosis not present

## 2018-05-18 DIAGNOSIS — Z853 Personal history of malignant neoplasm of breast: Secondary | ICD-10-CM | POA: Diagnosis not present

## 2018-05-18 DIAGNOSIS — C50512 Malignant neoplasm of lower-outer quadrant of left female breast: Secondary | ICD-10-CM | POA: Diagnosis not present

## 2018-05-18 DIAGNOSIS — Z803 Family history of malignant neoplasm of breast: Secondary | ICD-10-CM | POA: Diagnosis not present

## 2018-06-07 MED FILL — ROSUVASTATIN CALCIUM 40 MG: 40 | 90 days supply | Qty: 90 | Fill #2

## 2018-06-07 MED FILL — EZETIMIBE 10 MG TABS: 10 | 90 days supply | Qty: 90 | Fill #2

## 2018-06-07 MED FILL — PROPRANOLOL ER 80 MG CAP: 80 | 90 days supply | Qty: 90 | Fill #1

## 2018-07-14 ENCOUNTER — Other Ambulatory Visit: Payer: Self-pay | Admitting: Oncology

## 2018-07-14 MED FILL — ANASTROZOLE 1 MG TABLET: 1 | 90 days supply | Qty: 90 | Fill #0

## 2018-08-02 DIAGNOSIS — M79642 Pain in left hand: Secondary | ICD-10-CM | POA: Diagnosis not present

## 2018-08-02 DIAGNOSIS — I890 Lymphedema, not elsewhere classified: Secondary | ICD-10-CM | POA: Diagnosis not present

## 2018-08-02 MED FILL — CEPHALEXIN 500 MG CAPSULE: 500 | 14 days supply | Qty: 56 | Fill #0

## 2018-08-04 DIAGNOSIS — M79642 Pain in left hand: Secondary | ICD-10-CM | POA: Diagnosis not present

## 2018-08-12 DIAGNOSIS — M25532 Pain in left wrist: Secondary | ICD-10-CM | POA: Diagnosis not present

## 2018-08-26 DIAGNOSIS — M25642 Stiffness of left hand, not elsewhere classified: Secondary | ICD-10-CM | POA: Diagnosis not present

## 2018-08-31 MED FILL — PROPRANOLOL HCL ER 80 MG CP: 80 | 90 days supply | Qty: 90 | Fill #0

## 2018-09-07 ENCOUNTER — Telehealth: Payer: Self-pay | Admitting: Cardiology

## 2018-09-07 NOTE — Progress Notes (Signed)
Virtual Visit via Video Note   This visit type was conducted due to national recommendations for restrictions regarding the COVID-19 Pandemic (e.g. social distancing) in an effort to limit this patient's exposure and mitigate transmission in our community.  Due to her co-morbid illnesses, this patient is at least at moderate risk for complications without adequate follow up.  This format is felt to be most appropriate for this patient at this time.  All issues noted in this document were discussed and addressed.  A limited physical exam was performed with this format.  Please refer to the patient's chart for her consent to telehealth for [facility].   Date:  09/09/2018   ID:  Cheryl [name], DOB Oct 10, 1950, MRN [id number]  Patient Location: Home Provider Location: Home  PCP:  Burnard Bunting, MD  Cardiologist: Mikhala Kenan Martinique MD Electrophysiologist:  None   Evaluation Performed:  Follow-Up Visit  Chief Complaint:  Follow up PVCs  History of Present Illness:    Cheryl [name] is a 68 y.o. female with a  history of PVCs and hyperlipidemia. She had normal stress Echos done in 2009 and 2012. Repeat Echo in 2015 was normal. She has a history of PVCs.  She is s/p mastectomy for breast CA and does have a  history of Adriamycin use.  She has chronic lymphedema in her right arm. She has only rare palpitations on propranolol which she takes for migraine HAs.  She was diagnosed with left breast CA and underwent lumpectomy in January 2018 and has completed RT. She is now on hormonal therapy. She underwent bilateral BSO in September 2018 due to positive BRCA2 gene mutation. She has a strong family history of heart disease  On follow up today she is feeling well. No chest pain or SOB. Denies any palpitations. No edema or orthopnea.  The patient does not have symptoms concerning for COVID-19 infection (fever, chills, cough, or new shortness of breath).    Past Medical History:   Diagnosis Date  . Arthritis   . Breast cancer ([place]) 1999   right breast, Adriamycin Therapy  . Breast cancer ([place]) 2017   left breast  . Breast cancer of lower-outer quadrant of left female breast ([place]) 05/07/2016  . Cellulitis   . Chronic acquired lymphedema    rt arm, s/p right breast cancer  . GERD (gastroesophageal reflux disease)   . Headache   . Heart murmur    hx of  . 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 History of external beam radiation therapy 06/12/16-07/28/16   left breast 50.4 Gy in 28 fractions, left breast boost 10 Gy in 5 fractions  . History of hiatal hernia   . History of kidney stones   . History of migraine   . Hyperlipidemia   . Kidney problem 1969   history of left kidney bleeding   1969  . Osteopenia Valve/2018   T score -2.2 FRAX recognizing prior Fosamax use 10% / 1.6% overall stable from prior DEXA  . Personal history of radiation therapy 2018  . Personal history of radiation therapy 1999  . Strep throat    age 49  . Ventricular ectopy    chronic   Past Surgical History:  Procedure Laterality Date  . BREAST LUMPECTOMY Left 04/2016  . BREAST LUMPECTOMY WITH RADIOACTIVE SEED AND SENTINEL LYMPH NODE BIOPSY Left 05/07/2016   Procedure: LEFT BREAST LUMPECTOMY WITH RADIOACTIVE SEED AND LEFT AXILLARY SENTINEL LYMPH NODE BIOPSY;  Surgeon: Fanny Skates, MD;  Location:  [facility];  Service: General;  Laterality: Left;  . BREAST SURGERY     Mastectomy with TRAM  . CYSTOGRAM    . FINGER SURGERY Right 01/1999   finger on the hand  . FOOT SURGERY  2004,2015  . FOOT SURGERY Left 07/2005   3 toes  . left thumb  04/2004   joint surgery bil thumbs  . MASTECTOMY Right 1999   TRAM  . right mastectomy  10-27-97   with tram flap  . right thumb  1/05  . ROBOTIC ASSISTED BILATERAL SALPINGO OOPHERECTOMY Bilateral 01/13/2017   Procedure: XI ROBOTIC ASSISTED BILATERAL SALPINGO OOPHORECTOMY REPAIR OF CERVICAL LACERATION;  Surgeon: Everitt Amber, MD;  Location: [facility];   Service: Gynecology;  Laterality: Bilateral;  . TONSILLECTOMY AND ADENOIDECTOMY       Current Meds  Medication Sig  . anastrozole (ARIMIDEX) 1 MG tablet TAKE 1 TABLET BY MOUTH DAILY  . aspirin EC 81 MG tablet Take 81 mg by mouth 3 (three) times a week.   . cephALEXin (KEFLEX) 500 MG capsule Take 500 mg by mouth 4 (four) times daily as needed. cellulitis flare up.  . Coenzyme Q10 (COQ10) 100 MG CAPS Take 100 mg by mouth daily.  Marland Kitchen ezetimibe (ZETIA) 10 MG tablet TAKE 1 TABLET BY MOUTH DAILY.  . fexofenadine (ALLEGRA) 180 MG tablet Take 180 mg by mouth daily as needed (for sinus/allergies.).  Marland Kitchen propranolol (INDERAL LA) 80 MG 24 hr capsule Take 80 mg by mouth daily with breakfast.   . rizatriptan (MAXALT) 10 MG tablet Take 10 mg by mouth as needed. May repeat in 2 hours if needed  . rosuvastatin (CRESTOR) 40 MG tablet TAKE 1 TABLET BY MOUTH DAILY.  Marland Kitchen VITAMIN D, ERGOCALCIFEROL, PO Take 1-2 drops by mouth See admin instructions. 2 drops in the morning & 1 drop in the evening   1 drop = 2000 iu     Allergies:   Banthine [methantheline]; Demerol; and Theophyllines   Social History   Tobacco Use  . Smoking status: Never Smoker  . Smokeless tobacco: Never Used  Substance Use Topics  . Alcohol use: Yes    Alcohol/week: 1.0 standard drinks    Types: 1 Glasses of wine per week    Comment: VERY RARE  . Drug use: No     Family Hx: The patient's family history includes Breast cancer in some other family members; Breast cancer (age of onset: 67) in her mother; Dementia in her father; Heart attack in her paternal grandfather; Heart disease in her father; Hyperlipidemia in her sister; Hypertension in her father; Prostate cancer in an other family member; Tuberculosis in her paternal grandmother.  ROS:   Please see the history of present illness.    All other systems reviewed and are negative.   Prior CV studies:   The following studies were reviewed today:  Echo 09/01/16: Study Conclusions   - Left ventricle: The cavity size was normal. Systolic function was   normal. The estimated ejection fraction was in the range of 60%   to 65%. Wall motion was normal; there were no regional wall   motion abnormalities. Doppler parameters are consistent with   abnormal left ventricular relaxation (grade 1 diastolic   dysfunction). Doppler parameters are consistent with mildly   elevated ventricular end-diastolic filling pressure. - Aortic valve: There was mild regurgitation. - Aortic root: The aortic root was normal in size. - Right ventricle: The cavity size was normal. Wall thickness was   normal. Systolic function  was normal. - Tricuspid valve: There was no regurgitation. - Pulmonary arteries: Systolic pressure was within the normal   range. - Inferior vena cava: The vessel was normal in size. - Pericardium, extracardiac: There was no pericardial effusion.  Impressions:  - Global longitudinal strain: -23%, unchanged from prior on   11/21/2013.   Lateral S prime: 11 cm/sec.  Labs/Other Tests and Data Reviewed:    EKG:  No ECG reviewed.  Recent Labs: No results found for requested labs within last 8760 hours.   Recent Lipid Panel Lab Results  Component Value Date/Time   CHOL 168 04/18/2014 08:52 AM   TRIG 132.0 04/18/2014 08:52 AM   HDL 62.60 04/18/2014 08:52 AM   CHOLHDL 3 04/18/2014 08:52 AM   LDLCALC 79 04/18/2014 08:52 AM   Dated 09/10/17: cholesterol 175, triglycerides 73, HDL 57, LDL 103. Normal CBC, CMET and TSH  Wt Readings from Last 3 Encounters:  09/09/18 130 lb (59 kg)  11/10/17 136 lb 4.8 oz (61.8 kg)  08/27/17 139 lb 12.8 oz (63.4 kg)     Objective:    Vital Signs:  Ht 5' (1.524 m)   Wt 130 lb (59 kg)   BMI 25.39 kg/m    VITAL SIGNS:  reviewed  General no distress HEENT is normal. Respiration unlabored. Skin no rashes Neuro alert and oriented x 3. Nonfocal. Mood normal  ASSESSMENT & PLAN:    1. Hyperlipidemia- plan to check lab work  with Dr Reynaldo Minium in 2 weeks.  2. PVCs- asymptomatic  3. history of Breast CA and is s/p chemoRx. She has also had RT x 2.  stable.  COVID-19 Education: The signs and symptoms of COVID-19 were discussed with the patient and how to seek care for testing (follow up with PCP or arrange E-visit). The importance of social distancing was discussed today.  Time:   Today, I have spent 10 minutes with the patient with telehealth technology discussing the above problems.     Medication Adjustments/Labs and Tests Ordered: Current medicines are reviewed at length with the patient today.  Concerns regarding medicines are outlined above.   Tests Ordered: No orders of the defined types were placed in this encounter.   Medication Changes: No orders of the defined types were placed in this encounter.   Disposition:  Follow up in 1 year(s)  Signed, Teniola Tseng Martinique, MD  09/09/2018 8:05 AM    [facility]

## 2018-09-08 NOTE — Telephone Encounter (Signed)
Home phone/ my chart via email/ consent/ pre reg completed

## 2018-09-09 ENCOUNTER — Encounter: Payer: Self-pay | Admitting: Cardiology

## 2018-09-09 ENCOUNTER — Telehealth (INDEPENDENT_AMBULATORY_CARE_PROVIDER_SITE_OTHER): Payer: Medicare Other | Admitting: Cardiology

## 2018-09-09 ENCOUNTER — Other Ambulatory Visit: Payer: Self-pay

## 2018-09-09 VITALS — Ht 60.0 in | Wt 130.0 lb

## 2018-09-09 DIAGNOSIS — I493 Ventricular premature depolarization: Secondary | ICD-10-CM

## 2018-09-09 DIAGNOSIS — E7800 Pure hypercholesterolemia, unspecified: Secondary | ICD-10-CM

## 2018-09-09 NOTE — Patient Instructions (Addendum)
Continue your current therapy  Follow up in one year     Call 3 months before to schedule

## 2018-09-20 DIAGNOSIS — M859 Disorder of bone density and structure, unspecified: Secondary | ICD-10-CM | POA: Diagnosis not present

## 2018-09-20 DIAGNOSIS — E7849 Other hyperlipidemia: Secondary | ICD-10-CM | POA: Diagnosis not present

## 2018-09-22 MED FILL — ROSUVASTATIN CALCIUM 40 MG: 40 | 90 days supply | Qty: 90 | Fill #0

## 2018-09-22 MED FILL — EZETIMIBE 10 MG TABS: 10 | 90 days supply | Qty: 90 | Fill #0

## 2018-09-27 DIAGNOSIS — M1990 Unspecified osteoarthritis, unspecified site: Secondary | ICD-10-CM | POA: Diagnosis not present

## 2018-09-27 DIAGNOSIS — M8580 Other specified disorders of bone density and structure, unspecified site: Secondary | ICD-10-CM | POA: Diagnosis not present

## 2018-09-27 DIAGNOSIS — E785 Hyperlipidemia, unspecified: Secondary | ICD-10-CM | POA: Diagnosis not present

## 2018-09-27 DIAGNOSIS — Z0000 Encounter for general adult medical examination without abnormal findings: Secondary | ICD-10-CM | POA: Diagnosis not present

## 2018-09-27 DIAGNOSIS — C50911 Malignant neoplasm of unspecified site of right female breast: Secondary | ICD-10-CM | POA: Diagnosis not present

## 2018-09-27 DIAGNOSIS — R7301 Impaired fasting glucose: Secondary | ICD-10-CM | POA: Diagnosis not present

## 2018-09-27 DIAGNOSIS — I890 Lymphedema, not elsewhere classified: Secondary | ICD-10-CM | POA: Diagnosis not present

## 2018-09-27 MED FILL — RIZATRIPTAN 10 MG ODT: 10 | 30 days supply | Qty: 12 | Fill #0

## 2018-10-12 MED FILL — ANASTROZOLE 1 MG TABLET: 1 | 90 days supply | Qty: 90 | Fill #1

## 2018-11-06 ENCOUNTER — Other Ambulatory Visit: Payer: Self-pay | Admitting: Gastroenterology

## 2018-11-06 DIAGNOSIS — K869 Disease of pancreas, unspecified: Secondary | ICD-10-CM

## 2018-11-09 ENCOUNTER — Inpatient Hospital Stay: Payer: Medicare Other | Attending: Oncology | Admitting: Oncology

## 2018-11-09 ENCOUNTER — Other Ambulatory Visit: Payer: Self-pay

## 2018-11-09 ENCOUNTER — Telehealth: Payer: Self-pay | Admitting: Oncology

## 2018-11-09 VITALS — BP 151/74 | HR 63 | Temp 98.2°F | Resp 17 | Ht 60.0 in | Wt 131.3 lb

## 2018-11-09 DIAGNOSIS — M8580 Other specified disorders of bone density and structure, unspecified site: Secondary | ICD-10-CM | POA: Diagnosis not present

## 2018-11-09 DIAGNOSIS — Z170 Estrogen receptor positive status [ER+]: Secondary | ICD-10-CM | POA: Diagnosis not present

## 2018-11-09 DIAGNOSIS — N281 Cyst of kidney, acquired: Secondary | ICD-10-CM | POA: Diagnosis not present

## 2018-11-09 DIAGNOSIS — Z9223 Personal history of estrogen therapy: Secondary | ICD-10-CM

## 2018-11-09 DIAGNOSIS — I890 Lymphedema, not elsewhere classified: Secondary | ICD-10-CM | POA: Diagnosis not present

## 2018-11-09 DIAGNOSIS — Z79899 Other long term (current) drug therapy: Secondary | ICD-10-CM | POA: Diagnosis not present

## 2018-11-09 DIAGNOSIS — C50512 Malignant neoplasm of lower-outer quadrant of left female breast: Secondary | ICD-10-CM

## 2018-11-09 DIAGNOSIS — Z853 Personal history of malignant neoplasm of breast: Secondary | ICD-10-CM | POA: Diagnosis not present

## 2018-11-09 NOTE — Telephone Encounter (Signed)
Scheduled per los. Mailed printout  °

## 2018-11-09 NOTE — Progress Notes (Signed)
[place] OFFICE PROGRESS NOTE   Diagnosis: Breast cancer  INTERVAL HISTORY:   Cheryl [name] returns as scheduled.  She continues Arimidex.  She feels well.  No hot flashes or arthralgias.  She continues to have right arm lymphedema, no recent infection. A left mammogram on 04/19/2018 was negative. Objective:  Vital signs in last 24 hours:  Blood pressure (!) 151/74, pulse 63, temperature 98.2 F (36.8 C), temperature source Oral, resp. rate 17, height 5' (1.524 m), weight 131 lb 4.8 oz (59.6 kg), SpO2 97 %.   Limited physical examination secondary to distancing with the COVID pandemic HEENT: Neck without mass Lymphatics: No cervical, supraclavicular, or axillary nodes GI: No hepatomegaly Vascular: No leg edema, mild edema of the right arm with a sleeve in place Breast: Left breast without mass.  Status post right mastectomy with a TRAM reconstruction.  Firm tissue at the medial aspect of the TRAM.    Lab Results:  Lab Results  Component Value Date   WBC 4.2 01/09/2017   HGB 12.3 01/09/2017   HCT 35.0 (L) 01/09/2017   MCV 89.3 01/09/2017   PLT 152 01/09/2017   NEUTROABS 4.1 04/29/2016    CMP  Lab Results  Component Value Date   NA 139 01/09/2017   K 4.2 01/09/2017   CL 104 01/09/2017   CO2 27 01/09/2017   GLUCOSE 123 (H) 01/09/2017   BUN 16 01/09/2017   CREATININE 0.65 01/09/2017   CALCIUM 9.5 01/09/2017   PROT 7.2 01/09/2017   ALBUMIN 4.4 01/09/2017   AST 28 01/09/2017   ALT 25 01/09/2017   ALKPHOS 73 01/09/2017   BILITOT 1.1 01/09/2017   GFRNONAA >60 01/09/2017   GFRAA >60 01/09/2017    Medications: I have reviewed the patient's current medications.   Assessment/Plan: 1.Stage III right-sided breast cancer diagnosed in 1999. She remains in clinical remission. She completed adjuvant Femara therapy at the end of August 2009.  2. Left renal lesion on CT scan in April 2008 - a renal ultrasound confirmed bilateral renal cysts. Followed by  Cheryl [name] with repeat imaging to follow multiple renal cysts 3. Osteopenia - she continues vitamin D.  4. 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  5. BRCA2 variant of unclear clinical significance, with a significant family history of breast cancer. Negative breast/ovarian gene panel 06/15/2015 6. History of hematuria - reports being diagnosed with a renal stone by Dr Diona [name].  7. Left breast cancer-invasive lobular carcinoma, HER-2 negative, ER positive, PR positive, Ki-20 percent confirmed on a core biopsy of a left lower outer quadrant mass on 03/10/2016  Left lumpectomy an sentinel lymph node biopsy01/17/2018,1.5 cm lobular carcinoma, grade 2,pT1c,pN0  Oncotype-score 17, low risk  Initiation of adjuvant left breast radiation 06/12/2016, completed 07/28/2016  Initiation of adjuvantArimidex 07/28/2016  8.  Small pancreas cysts noted on MRI 06/25/2017- 2-year follow-up MRI recommended  9.   Left pleural thickening at the major fissure noted on the [facility] CT 06/11/2017-I reviewed the CT images and radiology, this area of thickening was present on a CT from 2008 and is slightly "thicker ".  Low clinical suspicion for malignancy.    Disposition: Cheryl [name] is in clinical remission from breast cancer.  She will continue Arimidex.  She is no longer followed by Cheryl [name].  She would like to change to a 77-monthfollow-up schedule here.  She is scheduled for a follow-up pancreas MRI next month.  She will schedule a left mammogram for late December or  early January.  Cheryl Coder, MD  11/09/2018  8:34 AM

## 2018-11-27 ENCOUNTER — Other Ambulatory Visit: Payer: Self-pay

## 2018-11-27 ENCOUNTER — Ambulatory Visit
Admission: RE | Admit: 2018-11-27 | Discharge: 2018-11-27 | Disposition: A | Discharge status: Home / Self Care | Payer: Medicare Other | Source: Ambulatory Visit | Attending: Gastroenterology | Admitting: Gastroenterology

## 2018-11-27 ENCOUNTER — Other Ambulatory Visit: Payer: Self-pay | Admitting: Gastroenterology

## 2018-11-27 DIAGNOSIS — K8689 Other specified diseases of pancreas: Secondary | ICD-10-CM | POA: Diagnosis not present

## 2018-11-27 DIAGNOSIS — N281 Cyst of kidney, acquired: Secondary | ICD-10-CM | POA: Diagnosis not present

## 2018-11-27 DIAGNOSIS — K869 Disease of pancreas, unspecified: Secondary | ICD-10-CM

## 2018-11-27 MED ORDER — GADOBENATE DIMEGLUMINE 529 MG/ML IV SOLN: 11.0000 mL | Freq: Once | INTRAVENOUS | Status: DC | PRN

## 2018-11-30 MED FILL — CEPHALEXIN 500 MG CAPSULE: 500 | 14 days supply | Qty: 56 | Fill #0

## 2018-12-01 MED FILL — PROPRANOLOL ER 80 MG CAP: 80 | 90 days supply | Qty: 90 | Fill #0

## 2019-01-01 DIAGNOSIS — Z23 Encounter for immunization: Secondary | ICD-10-CM | POA: Diagnosis not present

## 2019-01-10 ENCOUNTER — Other Ambulatory Visit: Payer: Self-pay | Admitting: Cardiology

## 2019-01-10 MED FILL — EZETIMIBE 10 MG TABS: 10 | 90 days supply | Qty: 90 | Fill #0

## 2019-01-10 MED FILL — ROSUVASTATIN CALCIUM 40 MG: 40 | 90 days supply | Qty: 90 | Fill #0

## 2019-01-17 MED FILL — ANASTROZOLE 1 MG TABLET: 1 | 90 days supply | Qty: 90 | Fill #0

## 2019-01-19 DIAGNOSIS — E785 Hyperlipidemia, unspecified: Secondary | ICD-10-CM | POA: Diagnosis not present

## 2019-01-19 DIAGNOSIS — M8580 Other specified disorders of bone density and structure, unspecified site: Secondary | ICD-10-CM | POA: Diagnosis not present

## 2019-01-19 DIAGNOSIS — M1990 Unspecified osteoarthritis, unspecified site: Secondary | ICD-10-CM | POA: Diagnosis not present

## 2019-01-19 DIAGNOSIS — G43909 Migraine, unspecified, not intractable, without status migrainosus: Secondary | ICD-10-CM | POA: Diagnosis not present

## 2019-01-19 DIAGNOSIS — I890 Lymphedema, not elsewhere classified: Secondary | ICD-10-CM | POA: Diagnosis not present

## 2019-01-19 DIAGNOSIS — E1169 Type 2 diabetes mellitus with other specified complication: Secondary | ICD-10-CM | POA: Diagnosis not present

## 2019-01-19 DIAGNOSIS — C50911 Malignant neoplasm of unspecified site of right female breast: Secondary | ICD-10-CM | POA: Diagnosis not present

## 2019-01-19 MED FILL — metFORMIN HCL ER 500 MG TB2: 500 | 90 days supply | Qty: 90 | Fill #0

## 2019-01-26 ENCOUNTER — Encounter: Payer: Self-pay | Admitting: Gynecology

## 2019-02-17 DIAGNOSIS — H2513 Age-related nuclear cataract, bilateral: Secondary | ICD-10-CM | POA: Diagnosis not present

## 2019-02-17 DIAGNOSIS — E119 Type 2 diabetes mellitus without complications: Secondary | ICD-10-CM | POA: Diagnosis not present

## 2019-02-17 DIAGNOSIS — H52203 Unspecified astigmatism, bilateral: Secondary | ICD-10-CM | POA: Diagnosis not present

## 2019-02-28 MED FILL — PROPRANOLOL ER 80 MG CAP: 80 | 90 days supply | Qty: 90 | Fill #1

## 2019-03-11 ENCOUNTER — Other Ambulatory Visit: Payer: Self-pay | Admitting: Oncology

## 2019-03-11 DIAGNOSIS — Z9889 Other specified postprocedural states: Secondary | ICD-10-CM

## 2019-03-25 MED FILL — CEPHALEXIN 500 MG CAPSULE: 500 | 14 days supply | Qty: 56 | Fill #0

## 2019-04-11 MED FILL — ANASTROZOLE 1 MG TABLET: 1 | 90 days supply | Qty: 90 | Fill #1

## 2019-04-11 MED FILL — METFORMIN HCL ER 500 MG TB2: 500 | 90 days supply | Qty: 90 | Fill #1

## 2019-04-21 ENCOUNTER — Other Ambulatory Visit: Payer: Self-pay

## 2019-04-21 ENCOUNTER — Ambulatory Visit
Admission: RE | Admit: 2019-04-21 | Discharge: 2019-04-21 | Disposition: A | Discharge status: Home / Self Care | Payer: Medicare Other | Source: Ambulatory Visit | Attending: Oncology | Admitting: Oncology

## 2019-04-21 DIAGNOSIS — Z9889 Other specified postprocedural states: Secondary | ICD-10-CM

## 2019-05-09 MED FILL — ROSUVASTATIN CALCIUM 40 MG: 40 | 90 days supply | Qty: 90 | Fill #1

## 2019-05-09 MED FILL — EZETIMIBE 10 MG TABS: 10 | 90 days supply | Qty: 90 | Fill #1

## 2019-05-12 ENCOUNTER — Other Ambulatory Visit: Payer: Self-pay

## 2019-05-12 ENCOUNTER — Inpatient Hospital Stay: Payer: Medicare Other | Attending: Oncology | Admitting: Oncology

## 2019-05-12 VITALS — BP 135/73 | HR 71 | Temp 98.9°F | Resp 16 | Ht 60.0 in | Wt 116.2 lb

## 2019-05-12 DIAGNOSIS — E119 Type 2 diabetes mellitus without complications: Secondary | ICD-10-CM | POA: Diagnosis not present

## 2019-05-12 DIAGNOSIS — L03113 Cellulitis of right upper limb: Secondary | ICD-10-CM | POA: Diagnosis not present

## 2019-05-12 DIAGNOSIS — Z170 Estrogen receptor positive status [ER+]: Secondary | ICD-10-CM | POA: Diagnosis not present

## 2019-05-12 DIAGNOSIS — I890 Lymphedema, not elsewhere classified: Secondary | ICD-10-CM | POA: Diagnosis not present

## 2019-05-12 DIAGNOSIS — C50911 Malignant neoplasm of unspecified site of right female breast: Secondary | ICD-10-CM | POA: Insufficient documentation

## 2019-05-12 DIAGNOSIS — Z79811 Long term (current) use of aromatase inhibitors: Secondary | ICD-10-CM | POA: Diagnosis not present

## 2019-05-12 DIAGNOSIS — C50512 Malignant neoplasm of lower-outer quadrant of left female breast: Secondary | ICD-10-CM

## 2019-05-12 DIAGNOSIS — C50912 Malignant neoplasm of unspecified site of left female breast: Secondary | ICD-10-CM | POA: Diagnosis not present

## 2019-05-12 DIAGNOSIS — M8580 Other specified disorders of bone density and structure, unspecified site: Secondary | ICD-10-CM | POA: Diagnosis not present

## 2019-05-12 NOTE — Progress Notes (Signed)
[place] OFFICE PROGRESS NOTE   Diagnosis: Breast cancer  INTERVAL HISTORY:   Cheryl [name] returns as scheduled.  She continues Arimidex.  She does not have hot flashes or significant arthralgias.  Improved since she began massage therapy.  She reports several episodes of cellulitis over the past year.  She has Keflex on hand to take as needed. A left mammogram 04/21/2019 was negative. Pancreas cyst were stable on MRI 11/27/2018.  She reports intentional weight loss after being diagnosed with diabetes by Dr. Reynaldo Minium. Objective:  Vital signs in last 24 hours:  Blood pressure 135/73, pulse 71, temperature 98.9 F (37.2 C), temperature source Temporal, resp. rate 16, height 5' (1.524 m), weight 116 lb 3.2 oz (52.7 kg), SpO2 100 %.    Limited physical examination secondary to distancing with the Covid pandemic Lymphatics: No cervical, supraclavicular, or axillary nodes Breasts: Left breast without mass, no evidence of local tumor recurrence at the lumpectomy scar.  Status post right mastectomy with a TRAM reconstruction.  Firm tissue in the medial aspect of the cranium. GI: No hepatomegaly Vascular: No leg edema, right arm with a lymphedema sleeve   Lab Results:  Lab Results  Component Value Date   WBC 4.2 01/09/2017   HGB 12.3 01/09/2017   HCT 35.0 (L) 01/09/2017   MCV 89.3 01/09/2017   PLT 152 01/09/2017   NEUTROABS 4.1 04/29/2016    CMP  Lab Results  Component Value Date   NA 139 01/09/2017   K 4.2 01/09/2017   CL 104 01/09/2017   CO2 27 01/09/2017   GLUCOSE 123 (H) 01/09/2017   BUN 16 01/09/2017   CREATININE 0.65 01/09/2017   CALCIUM 9.5 01/09/2017   PROT 7.2 01/09/2017   ALBUMIN 4.4 01/09/2017   AST 28 01/09/2017   ALT 25 01/09/2017   ALKPHOS 73 01/09/2017   BILITOT 1.1 01/09/2017   GFRNONAA >60 01/09/2017   GFRAA >60 01/09/2017     Medications: I have reviewed the patient's current medications.   Assessment/Plan:  1.Stage III  right-sided breast cancer diagnosed in 1999. She remains in clinical remission. She completed adjuvant Femara therapy at the end of August 2009.  2. Left renal lesion on CT scan in April 2008 - a renal ultrasound confirmed bilateral renal cysts. Followed by Dr. Diona Fanti with repeat imaging to follow multiple renal cysts 3. Osteopenia - she continues vitamin D.  4. 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  5. BRCA2 variant of unclear clinical significance, with a significant family history of breast cancer. Negative breast/ovarian gene panel 06/15/2015 6. History of hematuria - reports being diagnosed with a renal stone by Dr Diona Fanti.  7. Left breast cancer-invasive lobular carcinoma, HER-2 negative, ER positive, PR positive, Ki-20 percent confirmed on a core biopsy of a left lower outer quadrant mass on 03/10/2016  Left lumpectomy an sentinel lymph node biopsy01/17/2018,1.5 cm lobular carcinoma, grade 2,pT1c,pN0  Oncotype-score 17, low risk  Initiation of adjuvant left breast radiation 06/12/2016, completed 07/28/2016  Initiation of adjuvantArimidex 07/28/2016  8.  Small pancreas cysts noted on MRI 06/25/2017-stable on follow-up MRI 11/27/2018  9.   Left pleural thickening at the major fissure noted on the [facility] CT 06/11/2017-I reviewed the CT images and radiology, this area of thickening was present on a CT from 2008 and is slightly "thicker ".  Low clinical suspicion for malignancy. 10.  Diabetes     Disposition: Cheryl [name] appears stable.  She will continue Arimidex.  She will return for  an office visit in 8 months.  Betsy Coder, MD  05/12/2019  4:01 PM

## 2019-05-16 ENCOUNTER — Telehealth: Payer: Self-pay | Admitting: Oncology

## 2019-05-16 NOTE — Telephone Encounter (Signed)
Scheduled per los. Called and left msg. Mailed printout  °

## 2019-05-21 ENCOUNTER — Ambulatory Visit: Payer: Medicare Other

## 2019-05-29 ENCOUNTER — Ambulatory Visit: Payer: Medicare Other | Attending: Internal Medicine

## 2019-05-29 DIAGNOSIS — Z23 Encounter for immunization: Secondary | ICD-10-CM | POA: Insufficient documentation

## 2019-05-29 NOTE — Progress Notes (Signed)
   Covid-19 Vaccination Clinic  Name:  Madeira Ferns Damas    MRN: [id number] DOB: Nov 09, 1950  05/29/2019  Ms. Rallo was observed post Covid-19 immunization for 15 minutes without incidence. She was provided with Vaccine Information Sheet and instruction to access the V-Safe system.   Ms. Zaucha was instructed to call 911 with any severe reactions post vaccine: Marland Kitchen Difficulty breathing  . Swelling of your face and throat  . A fast heartbeat  . A bad rash all over your body  . Dizziness and weakness    Immunizations Administered    Name Date Dose VIS Date Route   Pfizer COVID-19 Vaccine 05/29/2019  8:33 AM 0.3 mL 04/01/2019 Intramuscular   Manufacturer: [place]   Lot: [id number]   [place]: [id number]

## 2019-06-01 ENCOUNTER — Ambulatory Visit: Payer: Medicare Other

## 2019-06-02 MED FILL — PROPRANOLOL ER 80 MG CAP: 80 | 90 days supply | Qty: 90 | Fill #2

## 2019-06-23 ENCOUNTER — Ambulatory Visit: Payer: Medicare Other | Attending: Internal Medicine

## 2019-06-23 DIAGNOSIS — Z23 Encounter for immunization: Secondary | ICD-10-CM | POA: Insufficient documentation

## 2019-06-23 NOTE — Progress Notes (Signed)
   Covid-19 Vaccination Clinic  Name:  Cheryl [name]    MRN: [id number] DOB: 06-Feb-1951  06/23/2019  Cheryl [name] was observed post Covid-19 immunization for 15 minutes without incident. She was provided with Vaccine Information Sheet and instruction to access the V-Safe system.   Cheryl [name] was instructed to call 911 with any severe reactions post vaccine: Marland Kitchen Difficulty breathing  . Swelling of face and throat  . A fast heartbeat  . A bad rash all over body  . Dizziness and weakness   Immunizations Administered    Name Date Dose VIS Date Route   Pfizer COVID-19 Vaccine 06/23/2019  8:34 AM 0.3 mL 04/01/2019 Intramuscular   Manufacturer: [place]   Lot: [id number]   [place]: [id number]

## 2019-07-15 ENCOUNTER — Other Ambulatory Visit: Payer: Self-pay | Admitting: Oncology

## 2019-07-15 MED FILL — METFORMIN HCL ER 500 MG TB2: 500 | 90 days supply | Qty: 90 | Fill #2

## 2019-07-18 MED FILL — ANASTROZOLE 1 MG TABLET: 1 | 90 days supply | Qty: 90 | Fill #0

## 2019-08-23 MED FILL — EZETIMIBE 10 MG TABS: 10 | 90 days supply | Qty: 90 | Fill #2

## 2019-08-30 ENCOUNTER — Other Ambulatory Visit (HOSPITAL_COMMUNITY): Payer: Self-pay | Admitting: Internal Medicine

## 2019-09-27 DIAGNOSIS — M859 Disorder of bone density and structure, unspecified: Secondary | ICD-10-CM | POA: Diagnosis not present

## 2019-09-27 DIAGNOSIS — E1169 Type 2 diabetes mellitus with other specified complication: Secondary | ICD-10-CM | POA: Diagnosis not present

## 2019-09-27 DIAGNOSIS — E7849 Other hyperlipidemia: Secondary | ICD-10-CM | POA: Diagnosis not present

## 2019-10-03 DIAGNOSIS — E1169 Type 2 diabetes mellitus with other specified complication: Secondary | ICD-10-CM | POA: Diagnosis not present

## 2019-10-03 DIAGNOSIS — M8580 Other specified disorders of bone density and structure, unspecified site: Secondary | ICD-10-CM | POA: Diagnosis not present

## 2019-10-03 DIAGNOSIS — E785 Hyperlipidemia, unspecified: Secondary | ICD-10-CM | POA: Diagnosis not present

## 2019-10-03 DIAGNOSIS — M1990 Unspecified osteoarthritis, unspecified site: Secondary | ICD-10-CM | POA: Diagnosis not present

## 2019-10-03 DIAGNOSIS — Z0000 Encounter for general adult medical examination without abnormal findings: Secondary | ICD-10-CM | POA: Diagnosis not present

## 2019-10-03 DIAGNOSIS — I890 Lymphedema, not elsewhere classified: Secondary | ICD-10-CM | POA: Diagnosis not present

## 2019-10-03 DIAGNOSIS — Z23 Encounter for immunization: Secondary | ICD-10-CM | POA: Diagnosis not present

## 2019-10-03 DIAGNOSIS — C50911 Malignant neoplasm of unspecified site of right female breast: Secondary | ICD-10-CM | POA: Diagnosis not present

## 2019-10-03 DIAGNOSIS — G43909 Migraine, unspecified, not intractable, without status migrainosus: Secondary | ICD-10-CM | POA: Diagnosis not present

## 2019-10-11 MED FILL — ANASTROZOLE 1 MG TABLET: 1 | 90 days supply | Qty: 90 | Fill #1

## 2019-10-12 ENCOUNTER — Other Ambulatory Visit (HOSPITAL_COMMUNITY): Payer: Self-pay | Admitting: Internal Medicine

## 2019-10-12 MED FILL — METFORMIN HCL ER 500 MG TB2: 500 | 90 days supply | Qty: 90 | Fill #0

## 2019-10-14 NOTE — Progress Notes (Signed)
Cardiology Office Note    Date:  10/17/2019   ID:  Cheryl [name], DOB 09/30/1950, MRN [id number]  PCP:  Burnard Bunting, MD  Cardiologist:  Adrik Khim Martinique, MD    History of Present Illness:  Cheryl [name] is a 69 y.o. female seen for follow up. She has a  history of PVCs and hyperlipidemia.  She had normal stress Echos done in 2009 and 2012. Repeat Echo in 2015 and 2018 were normal. She has a history of PVCs.   She is s/p mastectomy for breast CA and does have a  history of Adriamycin use.    She has chronic lymphedema in her right arm.  She takes Inderal for migraine HAs.   She was diagnosed with left breast CA and underwent lumpectomy in January 2018 and has completed RT. She is now on hormonal therapy. She underwent bilateral BSO in September 2018 due to positive BRCA2 gene mutation. She has a strong family history of heart disease She remains very active walking, doing yoga, and her own housework. No chest pain or SOB. No edema. No palpitations. Enjoys knitting with her 58 year old mother.   Past Medical History:  Diagnosis Date  . Arthritis   . Breast cancer ([place]) 1999   right breast, Adriamycin Therapy  . Breast cancer ([place]) 2017   left breast  . Breast cancer of lower-outer quadrant of left female breast ([place]) 05/07/2016  . Cellulitis   . Chronic acquired lymphedema    rt arm, s/p right breast cancer  . GERD (gastroesophageal reflux disease)   . Headache   . Heart murmur    hx of  . 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 History of external beam radiation therapy 06/12/16-07/28/16   left breast 50.4 Gy in 28 fractions, left breast boost 10 Gy in 5 fractions  . History of hiatal hernia   . History of kidney stones   . History of migraine   . Hyperlipidemia   . Kidney problem 1969   history of left kidney bleeding   1969  . Osteopenia Valve/2018   T score -2.2 FRAX recognizing prior Fosamax use 10% / 1.6% overall stable from prior DEXA  . Personal history of  radiation therapy 2018  . Personal history of radiation therapy 1999  . Strep throat    age 87  . Ventricular ectopy    chronic    Past Surgical History:  Procedure Laterality Date  . BREAST LUMPECTOMY Left 04/2016  . BREAST LUMPECTOMY WITH RADIOACTIVE SEED AND SENTINEL LYMPH NODE BIOPSY Left 05/07/2016   Procedure: LEFT BREAST LUMPECTOMY WITH RADIOACTIVE SEED AND LEFT AXILLARY SENTINEL LYMPH NODE BIOPSY;  Surgeon: Fanny Skates, MD;  Location: [facility];  Service: General;  Laterality: Left;  . BREAST SURGERY     Mastectomy with TRAM  . CYSTOGRAM    . FINGER SURGERY Right 01/1999   finger on the hand  . FOOT SURGERY  2004,2015  . FOOT SURGERY Left 07/2005   3 toes  . left thumb  04/2004   joint surgery bil thumbs  . MASTECTOMY Right 1999   TRAM  . right mastectomy  10-27-97   with tram flap  . right thumb  1/05  . ROBOTIC ASSISTED BILATERAL SALPINGO OOPHERECTOMY Bilateral 01/13/2017   Procedure: XI ROBOTIC ASSISTED BILATERAL SALPINGO OOPHORECTOMY REPAIR OF CERVICAL LACERATION;  Surgeon: Everitt Amber, MD;  Location: [facility];  Service: Gynecology;  Laterality: Bilateral;  . TONSILLECTOMY AND ADENOIDECTOMY  Current Medications: Outpatient Medications Prior to Visit  Medication Sig Dispense Refill  . anastrozole (ARIMIDEX) 1 MG tablet TAKE 1 TABLET BY MOUTH DAILY 90 tablet 1  . cephALEXin (KEFLEX) 500 MG capsule Take 500 mg by mouth 4 (four) times daily as needed. cellulitis flare up.    . Coenzyme Q10 (COQ10) 100 MG CAPS Take 100 mg by mouth daily.    Marland Kitchen ezetimibe (ZETIA) 10 MG tablet TAKE 1 TABLET BY MOUTH DAILY. 90 tablet 2  . famotidine (PEPCID) 20 MG tablet Take 20 mg by mouth daily.    . fexofenadine (ALLEGRA) 180 MG tablet Take 180 mg by mouth daily as needed (for sinus/allergies.).    Marland Kitchen metFORMIN (GLUCOPHAGE-XR) 500 MG 24 hr tablet Take 500 mg by mouth daily.    . propranolol (INDERAL LA) 80 MG 24 hr capsule Take 80 mg by mouth daily with breakfast.     . rizatriptan  (MAXALT) 10 MG tablet Take 10 mg by mouth as needed. May repeat in 2 hours if needed    . rosuvastatin (CRESTOR) 40 MG tablet TAKE 1 TABLET BY MOUTH DAILY. 90 tablet 2  . VITAMIN D, ERGOCALCIFEROL, PO Take 1-2 drops by mouth See admin instructions. 2 drops in the morning & 1 drop in the evening   1 drop = 2000 iu    . aspirin EC 81 MG tablet Take 81 mg by mouth 3 (three) times a week.     . esomeprazole (NEXIUM) 20 MG capsule Take 20 mg by mouth daily before breakfast.      No facility-administered medications prior to visit.     Allergies:   Banthine [methantheline], Demerol, and Theophyllines   Social History   Socioeconomic History  . Marital status: Single    Spouse name: Not on file  . Number of children: Not on file  . Years of education: Not on file  . Highest education level: Not on file  Occupational History  . Occupation: Optician, dispensing: [employer]  Tobacco Use  . Smoking status: Never Smoker  . Smokeless tobacco: Never Used  Vaping Use  . Vaping Use: Never used  Substance and Sexual Activity  . Alcohol use: Yes    Alcohol/week: 1.0 standard drink    Types: 1 Glasses of wine per week    Comment: VERY RARE  . Drug use: No  . Sexual activity: Never    Comment:  [place]  Other Topics Concern  . Not on file  Social History Narrative  . Not on file   Social Determinants of Health   Financial Resource Strain:   . Difficulty of Paying Living Expenses:   Food Insecurity:   . Worried About Charity fundraiser in the Last Year:   . Arboriculturist in the Last Year:   Transportation Needs:   . Film/video editor (Medical):   Marland Kitchen Lack of Transportation (Non-Medical):   Physical Activity:   . Days of Exercise per Week:   . Minutes of Exercise per Session:   Stress:   . Feeling of Stress :   Social Connections:   . Frequency of Communication with Friends and Family:   . Frequency of Social Gatherings with Friends and Family:   . Attends Religious Services:    . Active Member of Clubs or Organizations:   . Attends Archivist Meetings:   Marland Kitchen Marital Status:      Family History:  The patient's family history includes Breast cancer  in some other family members; Breast cancer (age of onset: 74) in her mother; Dementia in her father; Heart attack in her paternal grandfather; Heart disease in her father; Hyperlipidemia in her sister; Hypertension in her father; Prostate cancer in an other family member; Tuberculosis in her paternal grandmother.   ROS:   Please see the history of present illness.    ROS All other systems reviewed and are negative.   PHYSICAL EXAM:   VS:  BP 124/80 (BP Location: Left Arm, Patient Position: Sitting, Cuff Size: Normal)   Pulse 64   Ht 5' (1.524 m)   Wt 112 lb (50.8 kg)   BMI 21.87 kg/m    GENERAL:  Well appearing WF in NAD HEENT:  PERRL, EOMI, sclera are clear. Oropharynx is clear. NECK:  No jugular venous distention, carotid upstroke brisk and symmetric, no bruits, no thyromegaly or adenopathy LUNGS:  Clear to auscultation bilaterally CHEST:  Unremarkable HEART:  RRR,  PMI not displaced or sustained,S1 and S2 within normal limits, no S3, no S4: no clicks, no rubs, no murmurs ABD:  Soft, nontender. BS +, no masses or bruits. No hepatomegaly, no splenomegaly EXT:  2 + pulses throughout, no edema, no cyanosis no clubbing, has a compression sleeve on right arm. SKIN:  Warm and dry.  No rashes NEURO:  Alert and oriented x 3. Cranial nerves II through XII intact. PSYCH:  Cognitively intact    Wt Readings from Last 3 Encounters:  10/17/19 112 lb (50.8 kg)  05/12/19 116 lb 3.2 oz (52.7 kg)  11/09/18 131 lb 4.8 oz (59.6 kg)      Studies/Labs Reviewed:   EKG:  EKG is ordered today.  It demonstrates NSR with LAD and LVH. Rate 64. I have personally reviewed and interpreted this study.   Recent Labs: No results found for requested labs within last 8760 hours.   Lipid Panel    Component Value  Date/Time   CHOL 168 04/18/2014 0852   TRIG 132.0 04/18/2014 0852   HDL 62.60 04/18/2014 0852   CHOLHDL 3 04/18/2014 0852   VLDL 26.4 04/18/2014 0852   LDLCALC 79 04/18/2014 0852   Labs dated 08/18/15: cholesterol 193, triglycerides 118, HDL 59, LDL 110. TSH normal Dated 09/03/16: cholesterol 184, triglycerides 101, HDL 67, LDL 97.  Dated 09/27/19: cholesterol 149, triglycerides 72, HDL 69, LDL 66. A1c 6.7%. CBC, CMET, TSH normal.  Additional studies/ records that were reviewed today include:  Echo 5/14/18Study Conclusions  - Left ventricle: The cavity size was normal. Systolic function was   normal. The estimated ejection fraction was in the range of 60%   to 65%. Wall motion was normal; there were no regional wall   motion abnormalities. Doppler parameters are consistent with   abnormal left ventricular relaxation (grade 1 diastolic   dysfunction). Doppler parameters are consistent with mildly   elevated ventricular end-diastolic filling pressure. - Aortic valve: There was mild regurgitation. - Aortic root: The aortic root was normal in size. - Right ventricle: The cavity size was normal. Wall thickness was   normal. Systolic function was normal. - Tricuspid valve: There was no regurgitation. - Pulmonary arteries: Systolic pressure was within the normal   range. - Inferior vena cava: The vessel was normal in size. - Pericardium, extracardiac: There was no pericardial effusion.  Impressions:  - Global longitudinal strain: -23%, unchanged from prior on   11/21/2013.   Lateral S prime: 11 cm/sec.  PLAN:    1. Hyperlipidemia- on Crestor and Zetia- excellent  control  2. PVCs- asymptomatic  3. history of Breast CA and is s/p chemoRx. She has also had RT x 2. stable.    Follow up in one year.     Medication Adjustments/Labs and Tests Ordered: Current medicines are reviewed at length with the patient today.  Concerns regarding medicines are outlined above.  Medication  changes, Labs and Tests ordered today are listed in the Patient Instructions below. There are no Patient Instructions on file for this visit.   Signed, Jaycen Vercher Martinique, MD  10/17/2019 2:55 PM    [facility] [address] [place], [place], [address] [phone]

## 2019-10-17 ENCOUNTER — Ambulatory Visit (INDEPENDENT_AMBULATORY_CARE_PROVIDER_SITE_OTHER): Payer: Medicare Other | Admitting: Cardiology

## 2019-10-17 ENCOUNTER — Encounter: Payer: Self-pay | Admitting: Cardiology

## 2019-10-17 ENCOUNTER — Other Ambulatory Visit: Payer: Self-pay

## 2019-10-17 VITALS — BP 124/80 | HR 64 | Ht 60.0 in | Wt 112.0 lb

## 2019-10-17 DIAGNOSIS — E7800 Pure hypercholesterolemia, unspecified: Secondary | ICD-10-CM | POA: Diagnosis not present

## 2019-10-17 DIAGNOSIS — I493 Ventricular premature depolarization: Secondary | ICD-10-CM

## 2019-10-21 DIAGNOSIS — Z1212 Encounter for screening for malignant neoplasm of rectum: Secondary | ICD-10-CM | POA: Diagnosis not present

## 2019-11-28 MED FILL — PROPRANOLOL HCL ER 80 MG CP: 80 | 90 days supply | Qty: 90 | Fill #1

## 2019-12-22 ENCOUNTER — Other Ambulatory Visit: Payer: Self-pay | Admitting: Cardiology

## 2019-12-23 ENCOUNTER — Other Ambulatory Visit: Payer: Self-pay | Admitting: Cardiology

## 2019-12-23 MED FILL — EZETIMIBE 10 MG TABS: 10 | 90 days supply | Qty: 90 | Fill #0

## 2019-12-27 ENCOUNTER — Other Ambulatory Visit: Payer: Self-pay | Admitting: Cardiology

## 2019-12-27 ENCOUNTER — Other Ambulatory Visit: Payer: Self-pay | Admitting: Cardiovascular Disease

## 2019-12-27 MED FILL — ROSUVASTATIN CALCIUM 40 MG: 40 | 90 days supply | Qty: 90 | Fill #0

## 2020-01-09 ENCOUNTER — Other Ambulatory Visit: Payer: Self-pay | Admitting: Oncology

## 2020-01-09 MED FILL — METFORMIN HCL ER 500 MG TB2: 500 | 90 days supply | Qty: 90 | Fill #1

## 2020-01-09 MED FILL — ANASTROZOLE 1 MG TABLET: 1 | 90 days supply | Qty: 90 | Fill #0

## 2020-01-10 ENCOUNTER — Other Ambulatory Visit: Payer: Self-pay

## 2020-01-10 ENCOUNTER — Telehealth: Payer: Self-pay | Admitting: Oncology

## 2020-01-10 ENCOUNTER — Inpatient Hospital Stay: Payer: Medicare Other | Attending: Oncology | Admitting: Oncology

## 2020-01-10 VITALS — BP 153/64 | HR 63 | Temp 97.7°F | Resp 16 | Ht 60.0 in | Wt 109.5 lb

## 2020-01-10 DIAGNOSIS — Z23 Encounter for immunization: Secondary | ICD-10-CM | POA: Insufficient documentation

## 2020-01-10 DIAGNOSIS — Z170 Estrogen receptor positive status [ER+]: Secondary | ICD-10-CM

## 2020-01-10 DIAGNOSIS — C50512 Malignant neoplasm of lower-outer quadrant of left female breast: Secondary | ICD-10-CM

## 2020-01-10 DIAGNOSIS — Z1231 Encounter for screening mammogram for malignant neoplasm of breast: Secondary | ICD-10-CM | POA: Diagnosis not present

## 2020-01-10 MED ORDER — INFLUENZA VAC A&B SA ADJ QUAD 0.5 ML IM PRSY
0.5000 mL | PREFILLED_SYRINGE | Freq: Once | INTRAMUSCULAR | Status: AC
  Administered 2020-01-10: 0.5 mL via INTRAMUSCULAR

## 2020-01-10 MED ORDER — INFLUENZA VAC A&B SA ADJ QUAD 0.5 ML IM PRSY
PREFILLED_SYRINGE | INTRAMUSCULAR | Status: AC
  Filled 2020-01-10: qty 0.5

## 2020-01-10 NOTE — Progress Notes (Signed)
  [facility] OFFICE PROGRESS NOTE   Diagnosis: Breast cancer  INTERVAL HISTORY:   Cheryl [name] returns as scheduled.  She continues Arimidex.  She reports weight loss secondary to a change in diet and exercise.  Good appetite.  The area of fat necrosis at the right TRAM is more prominent following the weight loss.  Stable edema in the right arm.  No recent episode of cellulitis.  Objective:  Vital signs in last 24 hours:  Blood pressure (!) 153/64, pulse 63, temperature 97.7 F (36.5 C), temperature source Tympanic, resp. rate 16, height 5' (1.524 m), weight 109 lb 8 oz (49.7 kg), SpO2 100 %.   Lymphatics: No cervical, supraclavicular, or axillary nodes Resp: Lungs clear bilaterally Cardio: Regular rate and rhythm GI: No hepatomegaly Vascular: No leg edema, lymphedema sleeve of the right arm Breast: Left breast without mass.  Right mastectomy with a TRAM reconstruction.  Firm tissue at the medial aspect of the TRAM.   Medications: I have reviewed the patient's current medications.   Assessment/Plan: 1. Stage III right-sided breast cancer diagnosed in 1999. She remains in clinical remission. She completed adjuvant Femara therapy at the end of August 2009.  2. Left renal lesion on CT scan in April 2008 - a renal ultrasound confirmed bilateral renal cysts. Followed by Dr. Diona Fanti with repeat imaging to follow multiple renal cysts 3. Osteopenia - she continues vitamin D.  4. 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  5. BRCA2 variant of unclear clinical significance, with a significant family history of breast cancer. Negative breast/ovarian gene panel 06/15/2015 6. History of hematuria - reports being diagnosed with a renal stone by Dr Diona Fanti.  7. Left breast cancer-invasive lobular carcinoma, HER-2 negative, ER positive, PR positive, Ki-20 percent confirmed on a core biopsy of a left lower outer quadrant mass on 03/10/2016  Left lumpectomy an  sentinel lymph node biopsy01/17/2018,1.5 cm lobular carcinoma, grade 2,pT1c,pN0  Oncotype-score 17, low risk  Initiation of adjuvant left breast radiation 06/12/2016, completed 07/28/2016  Initiation of adjuvantArimidex 07/28/2016  8.  Small pancreas cysts noted on MRI 06/25/2017-stable on follow-up MRI 11/27/2018  9.   Left pleural thickening at the major fissure noted on the [facility] CT 06/11/2017-I reviewed the CT images and radiology, this area of thickening was present on a CT from 2008 and is slightly "thicker ".  Low clinical suspicion for malignancy. 10.  Diabetes    Disposition: Cheryl [name] is in remission from breast cancer.  She will continue Arimidex.  She will be scheduled for a mammogram in early January of next year.  We will be sure she is having a bone density follow-up via her gynecologist.  Cheryl [name] received an influenza vaccine today.  She will return for an office visit in 8 months.  Betsy Coder, MD  01/10/2020  12:05 PM

## 2020-01-10 NOTE — Telephone Encounter (Signed)
Scheduled appointment per 9/21 los. Spoke with patient who is aware of upcoming appointment. Gave patient calendar print out.

## 2020-01-11 ENCOUNTER — Telehealth: Payer: Self-pay | Admitting: *Deleted

## 2020-01-11 ENCOUNTER — Other Ambulatory Visit: Payer: Self-pay | Admitting: Oncology

## 2020-01-11 DIAGNOSIS — Z170 Estrogen receptor positive status [ER+]: Secondary | ICD-10-CM

## 2020-01-11 DIAGNOSIS — Z1231 Encounter for screening mammogram for malignant neoplasm of breast: Secondary | ICD-10-CM

## 2020-01-11 NOTE — Telephone Encounter (Signed)
Confirmed w/her that bone density scan has not been done since 2018. She will reach out to her GYN office to get this scheduled.

## 2020-01-17 ENCOUNTER — Ambulatory Visit: Payer: Medicare Other

## 2020-02-20 DIAGNOSIS — H2513 Age-related nuclear cataract, bilateral: Secondary | ICD-10-CM | POA: Diagnosis not present

## 2020-02-20 DIAGNOSIS — H5203 Hypermetropia, bilateral: Secondary | ICD-10-CM | POA: Diagnosis not present

## 2020-02-20 DIAGNOSIS — E119 Type 2 diabetes mellitus without complications: Secondary | ICD-10-CM | POA: Diagnosis not present

## 2020-02-20 DIAGNOSIS — H25013 Cortical age-related cataract, bilateral: Secondary | ICD-10-CM | POA: Diagnosis not present

## 2020-02-23 MED FILL — PROPRANOLOL HCL ER 80 MG CP: 80 | 90 days supply | Qty: 90 | Fill #2

## 2020-02-28 ENCOUNTER — Other Ambulatory Visit: Payer: Self-pay

## 2020-02-28 ENCOUNTER — Inpatient Hospital Stay: Payer: Medicare Other | Attending: Oncology

## 2020-02-28 DIAGNOSIS — Z23 Encounter for immunization: Secondary | ICD-10-CM | POA: Diagnosis not present

## 2020-02-28 NOTE — Progress Notes (Signed)
   Covid-19 Vaccination Clinic  Name:  Aoife Bold Coone    MRN: [id number] DOB: Dec 13, 1950  02/28/2020  Ms. Monterosso was observed post Covid-19 immunization for 15 minutes without incident. She was provided with Vaccine Information Sheet and instruction to access the V-Safe system.   Ms. Garro was instructed to call 911 with any severe reactions post vaccine: Marland Kitchen Difficulty breathing  . Swelling of face and throat  . A fast heartbeat  . A bad rash all over body  . Dizziness and weakness

## 2020-04-02 DIAGNOSIS — M1990 Unspecified osteoarthritis, unspecified site: Secondary | ICD-10-CM | POA: Diagnosis not present

## 2020-04-02 DIAGNOSIS — E1169 Type 2 diabetes mellitus with other specified complication: Secondary | ICD-10-CM | POA: Diagnosis not present

## 2020-04-02 DIAGNOSIS — I890 Lymphedema, not elsewhere classified: Secondary | ICD-10-CM | POA: Diagnosis not present

## 2020-04-09 MED FILL — ANASTROZOLE 1 MG TABLET: 1 | 90 days supply | Qty: 90 | Fill #1

## 2020-04-09 MED FILL — METFORMIN HCL ER 500 MG TB2: 500 | 90 days supply | Qty: 90 | Fill #2

## 2020-04-23 MED FILL — ROSUVASTATIN CALCIUM 40 MG: 40 | 90 days supply | Qty: 90 | Fill #1

## 2020-04-23 MED FILL — EZETIMIBE 10 MG TABS: 10 | 90 days supply | Qty: 90 | Fill #1

## 2020-04-24 ENCOUNTER — Other Ambulatory Visit: Payer: Self-pay

## 2020-04-24 ENCOUNTER — Ambulatory Visit
Admission: RE | Admit: 2020-04-24 | Discharge: 2020-04-24 | Disposition: A | Discharge status: Home / Self Care | Payer: Medicare Other | Source: Ambulatory Visit | Attending: Oncology | Admitting: Oncology

## 2020-04-24 DIAGNOSIS — R922 Inconclusive mammogram: Secondary | ICD-10-CM | POA: Diagnosis not present

## 2020-04-24 DIAGNOSIS — Z1231 Encounter for screening mammogram for malignant neoplasm of breast: Secondary | ICD-10-CM

## 2020-04-24 DIAGNOSIS — C50512 Malignant neoplasm of lower-outer quadrant of left female breast: Secondary | ICD-10-CM

## 2020-05-23 ENCOUNTER — Other Ambulatory Visit (HOSPITAL_COMMUNITY): Payer: Self-pay | Admitting: Internal Medicine

## 2020-05-23 MED FILL — PROPRANOLOL HCL ER 80 MG CP: 80 | 90 days supply | Qty: 90 | Fill #0

## 2020-06-05 ENCOUNTER — Other Ambulatory Visit: Payer: Self-pay | Admitting: Gastroenterology

## 2020-06-05 DIAGNOSIS — K862 Cyst of pancreas: Secondary | ICD-10-CM

## 2020-06-26 ENCOUNTER — Ambulatory Visit
Admission: RE | Admit: 2020-06-26 | Discharge: 2020-06-26 | Disposition: A | Discharge status: Home / Self Care | Payer: Medicare Other | Source: Ambulatory Visit | Attending: Gastroenterology | Admitting: Gastroenterology

## 2020-06-26 ENCOUNTER — Other Ambulatory Visit: Payer: Self-pay

## 2020-06-26 DIAGNOSIS — K862 Cyst of pancreas: Secondary | ICD-10-CM

## 2020-06-26 MED ORDER — GADOBENATE DIMEGLUMINE 529 MG/ML IV SOLN
9.0000 mL | Freq: Once | INTRAVENOUS | Status: AC | PRN
  Administered 2020-06-26: 9 mL via INTRAVENOUS

## 2020-07-10 ENCOUNTER — Other Ambulatory Visit: Payer: Self-pay | Admitting: Oncology

## 2020-07-10 ENCOUNTER — Ambulatory Visit: Payer: Medicare Other | Admitting: Oncology

## 2020-07-10 ENCOUNTER — Other Ambulatory Visit (HOSPITAL_COMMUNITY): Payer: Self-pay | Admitting: Internal Medicine

## 2020-07-10 MED FILL — METFORMIN HCL ER 500 MG TB2: 500 | 90 days supply | Qty: 90 | Fill #0

## 2020-07-10 MED FILL — ANASTROZOLE 1 MG TABLET: 1 | 90 days supply | Qty: 90 | Fill #0

## 2020-08-14 MED FILL — Rosuvastatin Calcium Tab 40 MG: ORAL | 90 days supply | Qty: 90 | Fill #0 | Status: AC

## 2020-08-14 MED FILL — Ezetimibe Tab 10 MG: ORAL | 90 days supply | Qty: 90 | Fill #0 | Status: AC

## 2020-08-15 ENCOUNTER — Other Ambulatory Visit (HOSPITAL_COMMUNITY): Payer: Self-pay

## 2020-08-24 ENCOUNTER — Other Ambulatory Visit (HOSPITAL_COMMUNITY): Payer: Self-pay

## 2020-08-24 MED FILL — Propranolol HCl Cap ER 24HR 80 MG: ORAL | 90 days supply | Qty: 90 | Fill #0 | Status: AC

## 2020-09-10 ENCOUNTER — Ambulatory Visit: Payer: Medicare Other | Admitting: Oncology

## 2020-09-11 ENCOUNTER — Other Ambulatory Visit (HOSPITAL_BASED_OUTPATIENT_CLINIC_OR_DEPARTMENT_OTHER): Payer: Self-pay

## 2020-09-11 ENCOUNTER — Other Ambulatory Visit: Payer: Self-pay

## 2020-09-11 ENCOUNTER — Ambulatory Visit: Payer: Medicare Other | Attending: Internal Medicine

## 2020-09-11 ENCOUNTER — Inpatient Hospital Stay: Payer: Medicare Other | Attending: Oncology | Admitting: Oncology

## 2020-09-11 VITALS — BP 126/75 | HR 58 | Temp 98.3°F | Resp 18 | Ht 60.0 in | Wt 109.0 lb

## 2020-09-11 DIAGNOSIS — C50911 Malignant neoplasm of unspecified site of right female breast: Secondary | ICD-10-CM | POA: Diagnosis not present

## 2020-09-11 DIAGNOSIS — C50512 Malignant neoplasm of lower-outer quadrant of left female breast: Secondary | ICD-10-CM

## 2020-09-11 DIAGNOSIS — E119 Type 2 diabetes mellitus without complications: Secondary | ICD-10-CM | POA: Insufficient documentation

## 2020-09-11 DIAGNOSIS — L03113 Cellulitis of right upper limb: Secondary | ICD-10-CM | POA: Diagnosis not present

## 2020-09-11 DIAGNOSIS — M8580 Other specified disorders of bone density and structure, unspecified site: Secondary | ICD-10-CM | POA: Diagnosis not present

## 2020-09-11 DIAGNOSIS — Z803 Family history of malignant neoplasm of breast: Secondary | ICD-10-CM | POA: Diagnosis not present

## 2020-09-11 DIAGNOSIS — Z79811 Long term (current) use of aromatase inhibitors: Secondary | ICD-10-CM | POA: Diagnosis not present

## 2020-09-11 DIAGNOSIS — K862 Cyst of pancreas: Secondary | ICD-10-CM | POA: Insufficient documentation

## 2020-09-11 DIAGNOSIS — Z170 Estrogen receptor positive status [ER+]: Secondary | ICD-10-CM

## 2020-09-11 DIAGNOSIS — Z23 Encounter for immunization: Secondary | ICD-10-CM

## 2020-09-11 DIAGNOSIS — N281 Cyst of kidney, acquired: Secondary | ICD-10-CM | POA: Insufficient documentation

## 2020-09-11 MED ORDER — PFIZER-BIONT COVID-19 VAC-TRIS 30 MCG/0.3ML IM SUSP
INTRAMUSCULAR | 0 refills | Status: DC
  Filled 2020-09-11: qty 0.3, 1d supply, fill #0

## 2020-09-11 NOTE — Progress Notes (Signed)
[facility] OFFICE PROGRESS NOTE   Diagnosis: Breast cancer  INTERVAL HISTORY:   Cheryl [name] returns as scheduled.  A left mammogram on 04/24/2020 was negative.  She continues Arimidex.  No recent episode of cellulitis.  She is scheduled to see Dr. Amedeo Plenty within the next few months.  Good appetite.  She has changed her diet since the death of her mother.  She fell walking her dog and injured the left chest wall.  She continues to have soreness.  Objective:  Vital signs in last 24 hours:  Blood pressure 126/75, pulse (!) 58, temperature 98.3 F (36.8 C), temperature source Oral, resp. rate 18, height 5' (1.524 m), weight 109 lb (49.4 kg), SpO2 96 %.    Lymphatics: No cervical, supraclavicular, or axillary nodes Resp: Lungs clear bilaterally Cardio: Regular rate and rhythm GI: No hepatomegaly Vascular: No leg edema.  Lymphedema sleeve in place at the right arm Breast: Status post right mastectomy with a TRAM reconstruction.  Nodular fat necrosis at the medial aspect of the TRAM.  Left breast without mass.  Portacath/PICC-without erythema  Lab Results:  Lab Results  Component Value Date   WBC 4.2 01/09/2017   HGB 12.3 01/09/2017   HCT 35.0 (L) 01/09/2017   MCV 89.3 01/09/2017   PLT 152 01/09/2017   NEUTROABS 4.1 04/29/2016    CMP  Lab Results  Component Value Date   NA 139 01/09/2017   K 4.2 01/09/2017   CL 104 01/09/2017   CO2 27 01/09/2017   GLUCOSE 123 (H) 01/09/2017   BUN 16 01/09/2017   CREATININE 0.65 01/09/2017   CALCIUM 9.5 01/09/2017   PROT 7.2 01/09/2017   ALBUMIN 4.4 01/09/2017   AST 28 01/09/2017   ALT 25 01/09/2017   ALKPHOS 73 01/09/2017   BILITOT 1.1 01/09/2017   GFRNONAA >60 01/09/2017   GFRAA >60 01/09/2017    Medications: I have reviewed the patient's current medications.   Assessment/Plan:  1. Stage III right-sided breast cancer diagnosed in 1999. She remains in clinical remission. She completed adjuvant Femara therapy at  the end of August 2009.  2. Left renal lesion on CT scan in April 2008 - a renal ultrasound confirmed bilateral renal cysts. Followed by Dr. Diona Fanti with repeat imaging to follow multiple renal cysts 3. Osteopenia - she continues vitamin D.  4. 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  5. BRCA2 variant of unclear clinical significance, with a significant family history of breast cancer. Negative breast/ovarian gene panel 06/15/2015 6. History of hematuria - reports being diagnosed with a renal stone by Dr Diona Fanti.  7. Left breast cancer-invasive lobular carcinoma, HER-2 negative, ER positive, PR positive, Ki-20 percent confirmed on a core biopsy of a left lower outer quadrant mass on 03/10/2016  Left lumpectomy an sentinel lymph node biopsy01/17/2018,1.5 cm lobular carcinoma, grade 2,pT1c,pN0  Oncotype-score 17, low risk  Initiation of adjuvant left breast radiation 06/12/2016, completed 07/28/2016  Initiation of adjuvantArimidex 07/28/2016  8.  Small pancreas cysts noted on MRI 06/25/2017-stable on follow-up MRI 11/27/2018  9.   Left pleural thickening at the major fissure noted on the [facility] CT 06/11/2017-I reviewed the CT images and radiology, this area of thickening was present on a CT from 2008 and is slightly "thicker ".  Low clinical suspicion for malignancy. 10.  Diabetes    Disposition: Cheryl [name] remains in clinical remission from breast cancer.  She continues adjuvant Arimidex.  She will schedule a bone density scan at her next  gynecology appointment.  She will return for an office visit in 8 months.  She plans to obtain another COVID-19 booster vaccine today.  Cheryl Coder, MD  09/11/2020  8:08 AM

## 2020-09-11 NOTE — Progress Notes (Signed)
   Covid-19 Vaccination Clinic  Name:  Cheryl [name]    MRN: [id number] DOB: 1951-01-07  09/11/2020  Cheryl [name] was observed post Covid-19 immunization for 15 minutes without incident. She was provided with Vaccine Information Sheet and instruction to access the V-Safe system.   Cheryl [name] was instructed to call 911 with any severe reactions post vaccine: Marland Kitchen Difficulty breathing  . Swelling of face and throat  . A fast heartbeat  . A bad rash all over body  . Dizziness and weakness   Immunizations Administered    Name Date Dose VIS Date Route   PFIZER Comrnaty(Gray TOP) Covid-19 Vaccine 09/11/2020  8:45 AM 0.3 mL 03/29/2020 Intramuscular   Manufacturer: [employer], [employer]   Lot: [id number]   NDC: [phone]

## 2020-10-03 DIAGNOSIS — E1169 Type 2 diabetes mellitus with other specified complication: Secondary | ICD-10-CM | POA: Diagnosis not present

## 2020-10-03 DIAGNOSIS — E785 Hyperlipidemia, unspecified: Secondary | ICD-10-CM | POA: Diagnosis not present

## 2020-10-03 DIAGNOSIS — R82998 Other abnormal findings in urine: Secondary | ICD-10-CM | POA: Diagnosis not present

## 2020-10-03 DIAGNOSIS — M859 Disorder of bone density and structure, unspecified: Secondary | ICD-10-CM | POA: Diagnosis not present

## 2020-10-03 DIAGNOSIS — Z0000 Encounter for general adult medical examination without abnormal findings: Secondary | ICD-10-CM | POA: Diagnosis not present

## 2020-10-09 ENCOUNTER — Other Ambulatory Visit (HOSPITAL_COMMUNITY): Payer: Self-pay

## 2020-10-09 MED FILL — Metformin HCl Tab ER 24HR 500 MG: ORAL | 90 days supply | Qty: 90 | Fill #0 | Status: AC

## 2020-10-09 MED FILL — Anastrozole Tab 1 MG: ORAL | 90 days supply | Qty: 90 | Fill #0 | Status: AC

## 2020-10-10 DIAGNOSIS — E785 Hyperlipidemia, unspecified: Secondary | ICD-10-CM | POA: Diagnosis not present

## 2020-10-10 DIAGNOSIS — Z0000 Encounter for general adult medical examination without abnormal findings: Secondary | ICD-10-CM | POA: Diagnosis not present

## 2020-10-10 DIAGNOSIS — E1169 Type 2 diabetes mellitus with other specified complication: Secondary | ICD-10-CM | POA: Diagnosis not present

## 2020-10-10 DIAGNOSIS — C50911 Malignant neoplasm of unspecified site of right female breast: Secondary | ICD-10-CM | POA: Diagnosis not present

## 2020-10-10 DIAGNOSIS — I890 Lymphedema, not elsewhere classified: Secondary | ICD-10-CM | POA: Diagnosis not present

## 2020-10-11 NOTE — Progress Notes (Signed)
Cardiology Office Note    Date:  10/16/2020   ID:  Cheryl [name], DOB 1950-11-24, MRN [id number]  PCP:  Burnard Bunting, MD  Cardiologist:  Elver Stadler [place], MD    History of Present Illness:  Cheryl [name] is a 70 y.o. female seen for follow up. She has a  history of PVCs and hyperlipidemia.  She had normal stress Echos done in 2009 and 2012. Repeat Echo in 2015 and 2018 were normal. She has a history of PVCs.   She is s/p mastectomy for breast CA and does have a  history of Adriamycin use.    She has chronic lymphedema in her right arm.  She takes Inderal for migraine HAs.   She was diagnosed with left breast CA and underwent lumpectomy in January 2018 and has completed RT. She is now on hormonal therapy. She underwent bilateral BSO in September 2018 due to positive BRCA2 gene mutation. She has a strong family history of heart disease She remains very active walking, doing yoga, and her own housework. No chest pain or SOB. No edema. No palpitations.    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   History of external beam radiation therapy 06/12/16-07/28/16   left breast 50.4 Gy in 28 fractions, left breast boost 10 Gy in 5 fractions   History of hiatal hernia    History of kidney stones    History of migraine    Hyperlipidemia    Kidney problem 1969   history of left kidney bleeding   1969   Osteopenia Valve/2018   T score -2.2 FRAX recognizing prior Fosamax use 10% / 1.6% overall stable from prior DEXA   Personal history of radiation therapy 2018   Personal history of radiation therapy  1999   Strep throat    age 61   Ventricular ectopy    chronic    Past Surgical History:  Procedure Laterality Date   BREAST LUMPECTOMY Left 04/2016   BREAST LUMPECTOMY WITH RADIOACTIVE SEED AND SENTINEL LYMPH NODE BIOPSY Left 05/07/2016   Procedure: LEFT BREAST LUMPECTOMY WITH RADIOACTIVE SEED AND LEFT AXILLARY SENTINEL LYMPH NODE BIOPSY;  Surgeon: Fanny Skates, MD;  Location: [facility];  Service: General;  Laterality: Left;   BREAST SURGERY     Mastectomy with TRAM   CYSTOGRAM     FINGER SURGERY Right 01/1999   finger on the hand   FOOT SURGERY  2004,2015   FOOT SURGERY Left 07/2005   3 toes   left thumb  04/2004   joint surgery bil thumbs   MASTECTOMY Right 1999   TRAM   right mastectomy  10-27-97   with tram flap   right thumb  1/05   ROBOTIC ASSISTED BILATERAL SALPINGO OOPHERECTOMY Bilateral 01/13/2017   Procedure: XI ROBOTIC ASSISTED BILATERAL [place];  Surgeon: Everitt Amber, MD;  Location: [facility];  Service: Gynecology;  Laterality: Bilateral;   TONSILLECTOMY AND ADENOIDECTOMY      Current Medications: Outpatient Medications Prior to  Visit  Medication Sig Dispense Refill   anastrozole (ARIMIDEX) 1 MG tablet TAKE 1 TABLET BY MOUTH ONCE DAILY. 90 tablet 1   cephALEXin (KEFLEX) 500 MG capsule Take 500 mg by mouth 4 (four) times daily as needed. cellulitis flare up.     Coenzyme Q10 (COQ10) 100 MG CAPS Take 200 mg by mouth daily.     COVID-19 mRNA Vac-TriS, Pfizer, (PFIZER-BIONT COVID-19 VAC-TRIS) SUSP injection Inject into the muscle. 0.3 mL 0   ezetimibe (ZETIA) 10 MG tablet TAKE 1 TABLET BY MOUTH DAILY. 90 tablet 3   famotidine (PEPCID) 20 MG tablet Take 20 mg by mouth 2 (two) times daily.      fexofenadine (ALLEGRA) 180 MG tablet Take 180 mg by mouth daily as needed (for sinus/allergies.).     metFORMIN (GLUCOPHAGE-XR) 500 MG 24 hr tablet TAKE 1 TABLET BY MOUTH ONCE DAILY. 90 tablet 3   propranolol ER (INDERAL LA) 80 MG 24 hr  capsule TAKE 1 CAPSULE BY MOUTH ONCE DAILY 90 capsule 2   rizatriptan (MAXALT) 10 MG tablet Take 10 mg by mouth as needed. May repeat in 2 hours if needed     rosuvastatin (CRESTOR) 40 MG tablet TAKE 1 TABLET BY MOUTH DAILY. 90 tablet 2   VITAMIN D, ERGOCALCIFEROL, PO Take 1-2 drops by mouth See admin instructions. 2 drops in the morning & 1 drop in the evening   1 drop = 2000 iu     No facility-administered medications prior to visit.     Allergies:   Banthine [methantheline], Demerol, and Theophyllines   Social History   Socioeconomic History   Marital status: Single    Spouse name: Not on file   Number of children: Not on file   Years of education: Not on file   Highest education level: Not on file  Occupational History   Occupation: Nurse    Employer: [employer]  Tobacco Use   Smoking status: Never   Smokeless tobacco: Never  Vaping Use   Vaping Use: Never used  Substance and Sexual Activity   Alcohol use: Yes    Alcohol/week: 1.0 standard drink    Types: 1 Glasses of wine per week    Comment: VERY RARE   Drug use: No   Sexual activity: Never    Comment:  [place]  Other Topics Concern   Not on file  Social History Narrative   Not on file   Social Determinants of Health   Financial Resource Strain: Not on file  Food Insecurity: Not on file  Transportation Needs: Not on file  Physical Activity: Not on file  Stress: Not on file  Social Connections: Not on file     Family History:  The patient's family history includes Breast cancer in some other family members; Breast cancer (age of onset: 3) in her mother; Dementia in her father; Heart attack in her paternal grandfather; Heart disease in her father; Hyperlipidemia in her sister; Hypertension in her father; Prostate cancer in an other family member; Tuberculosis in her paternal grandmother.   ROS:   Please see the history of present illness.    ROS All other systems reviewed and are negative.   PHYSICAL  EXAM:   VS:  BP 130/72   Pulse (!) 52   Ht 5' (1.524 m)   Wt 101 lb 9.6 oz (46.1 kg)   SpO2 99%   BMI 19.84 kg/m    GENERAL:  Well appearing WF in NAD HEENT:  PERRL, EOMI, sclera are  clear. Oropharynx is clear. NECK:  No jugular venous distention, carotid upstroke brisk and symmetric, no bruits, no thyromegaly or adenopathy LUNGS:  Clear to auscultation bilaterally CHEST:  Unremarkable HEART:  RRR,  PMI not displaced or sustained,S1 and S2 within normal limits, no S3, no S4: no clicks, no rubs, no murmurs ABD:  Soft, nontender. BS +, no masses or bruits. No hepatomegaly, no splenomegaly EXT:  2 + pulses throughout, no edema, no cyanosis no clubbing, has a compression sleeve on right arm. SKIN:  Warm and dry.  No rashes NEURO:  Alert and oriented x 3. Cranial nerves II through XII intact. PSYCH:  Cognitively intact    Wt Readings from Last 3 Encounters:  10/16/20 101 lb 9.6 oz (46.1 kg)  09/11/20 109 lb (49.4 kg)  01/10/20 109 lb 8 oz (49.7 kg)      Studies/Labs Reviewed:   EKG:  EKG is ordered today.  It demonstrates NSR with LAD and LVH. Rate 52. I have personally reviewed and interpreted this study.   Recent Labs: No results found for requested labs within last 8760 hours.   Lipid Panel    Component Value Date/Time   CHOL 168 04/18/2014 0852   TRIG 132.0 04/18/2014 0852   HDL 62.60 04/18/2014 0852   CHOLHDL 3 04/18/2014 0852   VLDL 26.4 04/18/2014 0852   LDLCALC 79 04/18/2014 0852   Labs dated 08/18/15: cholesterol 193, triglycerides 118, HDL 59, LDL 110. TSH normal Dated 09/03/16: cholesterol 184, triglycerides 101, HDL 67, LDL 97.  Dated 09/27/19: cholesterol 149, triglycerides 72, HDL 69, LDL 66. A1c 6.7%. CBC, CMET, TSH normal. Dated 10/03/20: cholesterol 144, triglycerides 86, HDL 76, LDL 51. A1c 6.7%. CMET and TSH normal.  Additional studies/ records that were reviewed today include:  Echo 5/14/18Study Conclusions   - Left ventricle: The cavity size was  normal. Systolic function was   normal. The estimated ejection fraction was in the range of 60%   to 65%. Wall motion was normal; there were no regional wall   motion abnormalities. Doppler parameters are consistent with   abnormal left ventricular relaxation (grade 1 diastolic   dysfunction). Doppler parameters are consistent with mildly   elevated ventricular end-diastolic filling pressure. - Aortic valve: There was mild regurgitation. - Aortic root: The aortic root was normal in size. - Right ventricle: The cavity size was normal. Wall thickness was   normal. Systolic function was normal. - Tricuspid valve: There was no regurgitation. - Pulmonary arteries: Systolic pressure was within the normal   range. - Inferior vena cava: The vessel was normal in size. - Pericardium, extracardiac: There was no pericardial effusion.   Impressions:   - Global longitudinal strain: -23%, unchanged from prior on   11/21/2013.   Lateral S prime: 11 cm/sec.  PLAN:     1. Hyperlipidemia- on Crestor and Zetia- excellent control   2. PVCs- asymptomatic   3. history of Breast CA and is s/p chemoRx. She has also had RT x 2.  stable.    Follow up in one year.     Medication Adjustments/Labs and Tests Ordered: Current medicines are reviewed at length with the patient today.  Concerns regarding medicines are outlined above.  Medication changes, Labs and Tests ordered today are listed in the Patient Instructions below. There are no Patient Instructions on file for this visit.   Signed, Cherokee Clowers Martinique, MD  10/16/2020 8:38 AM    [facility] [address] [place], [place], [address] [phone]

## 2020-10-16 ENCOUNTER — Other Ambulatory Visit: Payer: Self-pay

## 2020-10-16 ENCOUNTER — Ambulatory Visit (INDEPENDENT_AMBULATORY_CARE_PROVIDER_SITE_OTHER): Payer: Medicare Other | Admitting: Cardiology

## 2020-10-16 ENCOUNTER — Encounter: Payer: Self-pay | Admitting: Cardiology

## 2020-10-16 VITALS — BP 130/72 | HR 52 | Ht 60.0 in | Wt 101.6 lb

## 2020-10-16 DIAGNOSIS — E7800 Pure hypercholesterolemia, unspecified: Secondary | ICD-10-CM | POA: Diagnosis not present

## 2020-10-16 DIAGNOSIS — I493 Ventricular premature depolarization: Secondary | ICD-10-CM | POA: Diagnosis not present

## 2020-10-18 ENCOUNTER — Other Ambulatory Visit (HOSPITAL_COMMUNITY): Payer: Self-pay

## 2020-10-18 MED ORDER — CEPHALEXIN 500 MG PO CAPS
500.0000 mg | ORAL_CAPSULE | Freq: Four times a day (QID) | ORAL | 0 refills | Status: DC
  Filled 2020-10-18: qty 56, 14d supply, fill #0

## 2020-10-24 ENCOUNTER — Other Ambulatory Visit (HOSPITAL_COMMUNITY): Payer: Self-pay

## 2020-10-24 DIAGNOSIS — I7300 Raynaud's syndrome without gangrene: Secondary | ICD-10-CM | POA: Diagnosis not present

## 2020-10-24 DIAGNOSIS — M79601 Pain in right arm: Secondary | ICD-10-CM | POA: Diagnosis not present

## 2020-10-24 DIAGNOSIS — I890 Lymphedema, not elsewhere classified: Secondary | ICD-10-CM | POA: Diagnosis not present

## 2020-10-24 MED ORDER — CEPHALEXIN 500 MG PO CAPS
500.0000 mg | ORAL_CAPSULE | Freq: Four times a day (QID) | ORAL | 0 refills | Status: AC
  Filled 2020-10-24 – 2021-01-04 (×2): qty 56, 14d supply, fill #0

## 2020-11-21 ENCOUNTER — Other Ambulatory Visit: Payer: Self-pay | Admitting: Cardiovascular Disease

## 2020-11-21 ENCOUNTER — Other Ambulatory Visit (HOSPITAL_COMMUNITY): Payer: Self-pay

## 2020-11-21 MED ORDER — ROSUVASTATIN CALCIUM 40 MG PO TABS
40.0000 mg | ORAL_TABLET | Freq: Every day | ORAL | 3 refills | Status: DC
  Filled 2020-11-21 – 2021-03-13 (×2): qty 90, 90d supply, fill #0
  Filled 2021-07-04: qty 90, 90d supply, fill #1
  Filled 2021-10-07: qty 90, 90d supply, fill #2

## 2020-11-21 MED FILL — Propranolol HCl Cap ER 24HR 80 MG: ORAL | 90 days supply | Qty: 90 | Fill #1 | Status: AC

## 2020-11-21 MED FILL — Ezetimibe Tab 10 MG: ORAL | 90 days supply | Qty: 90 | Fill #1 | Status: AC

## 2020-11-23 ENCOUNTER — Other Ambulatory Visit: Payer: Self-pay

## 2020-11-23 ENCOUNTER — Ambulatory Visit (INDEPENDENT_AMBULATORY_CARE_PROVIDER_SITE_OTHER): Payer: Medicare Other | Admitting: Obstetrics & Gynecology

## 2020-11-23 ENCOUNTER — Encounter: Payer: Self-pay | Admitting: Obstetrics & Gynecology

## 2020-11-23 VITALS — BP 114/70 | HR 66 | Resp 12 | Ht 60.0 in | Wt 99.0 lb

## 2020-11-23 DIAGNOSIS — Z9289 Personal history of other medical treatment: Secondary | ICD-10-CM | POA: Diagnosis not present

## 2020-11-23 DIAGNOSIS — Z90722 Acquired absence of ovaries, bilateral: Secondary | ICD-10-CM

## 2020-11-23 DIAGNOSIS — Z01419 Encounter for gynecological examination (general) (routine) without abnormal findings: Secondary | ICD-10-CM

## 2020-11-23 DIAGNOSIS — M8589 Other specified disorders of bone density and structure, multiple sites: Secondary | ICD-10-CM

## 2020-11-23 DIAGNOSIS — Z780 Asymptomatic menopausal state: Secondary | ICD-10-CM

## 2020-11-23 DIAGNOSIS — Z170 Estrogen receptor positive status [ER+]: Secondary | ICD-10-CM

## 2020-11-23 DIAGNOSIS — Z1509 Genetic susceptibility to other malignant neoplasm: Secondary | ICD-10-CM

## 2020-11-23 DIAGNOSIS — C50512 Malignant neoplasm of lower-outer quadrant of left female breast: Secondary | ICD-10-CM

## 2020-11-23 DIAGNOSIS — Z1501 Genetic susceptibility to malignant neoplasm of breast: Secondary | ICD-10-CM

## 2020-11-23 NOTE — Progress Notes (Signed)
Cheryl [name] Cheryl [name] May 09, 1950 [id number]   History:    70 y.o. G0  RP:  New (>3 yrs) patient presenting for annual gyn exam   HPI: Postmenopausal, well on no HRT.  No PMB.  No pelvic pain.  Abstinent.  Breasts stable.  S/P Left Breast Ca.  BrCa2 Pos.  S/P BSO.  BMI 19.33.  Needs to increase calories.  Good fitness.  Health labs with Fam MD.  Past medical history,surgical history, family history and social history were all reviewed and documented in the EPIC chart.  Gynecologic History No LMP recorded. Patient is postmenopausal.  Obstetric History OB History  Gravida Para Term Preterm AB Living  0            SAB IAB Ectopic Multiple Live Births                ROS: A ROS was performed and pertinent positives and negatives are included in the history.  GENERAL: No fevers or chills. HEENT: No change in vision, no earache, sore throat or sinus congestion. NECK: No pain or stiffness. CARDIOVASCULAR: No chest pain or pressure. No palpitations. PULMONARY: No shortness of breath, cough or wheeze. GASTROINTESTINAL: No abdominal pain, nausea, vomiting or diarrhea, melena or bright red blood per rectum. GENITOURINARY: No urinary frequency, urgency, hesitancy or dysuria. MUSCULOSKELETAL: No joint or muscle pain, no back pain, no recent trauma. DERMATOLOGIC: No rash, no itching, no lesions. ENDOCRINE: No polyuria, polydipsia, no heat or cold intolerance. No recent change in weight. HEMATOLOGICAL: No anemia or easy bruising or bleeding. NEUROLOGIC: No headache, seizures, numbness, tingling or weakness. PSYCHIATRIC: No depression, no loss of interest in normal activity or change in sleep pattern.     Exam:   BP 114/70 (BP Location: Right Arm, Patient Position: Sitting, Cuff Size: Normal)   Pulse 66   Resp 12   Ht 5' (1.524 m)   Wt 99 lb (44.9 kg)   BMI 19.33 kg/m   Body mass index is 19.33 kg/m.  General appearance : Well developed well nourished female. No acute distress HEENT: Eyes:  no retinal hemorrhage or exudates,  Neck supple, trachea midline, no carotid bruits, no thyroidmegaly Lungs: Clear to auscultation, no rhonchi or wheezes, or rib retractions  Heart: Regular rate and rhythm, no murmurs or gallops Breast:Examined in sitting and supine position, no palpable masses or tenderness,  no skin retraction, no nipple inversion, no nipple discharge, no skin discoloration, no axillary or supraclavicular lymphadenopathy.  Right breast with severe scarring. Abdomen: no palpable masses or tenderness, no rebound or guarding Extremities: no edema or skin discoloration or tenderness  Pelvic: Vulva: Normal             Vagina: No gross lesions or discharge  Cervix: No gross lesions or discharge  Uterus  AV, normal size, shape and consistency, non-tender and mobile  Adnexa  Without masses or tenderness  Anus: Normal   Assessment/Plan:  70 y.o. female for annual exam   1. Well female exam with routine gynecological exam Normal gynecologic exam.  No indication for a Pap test at this time.  H/O Left Breast Ca.  Screening mammo neg 04/2020.  [place] 5 yrs ago.  BMI 19.33.  Increase calories.  Health labs with Fam MD.  2. Postmenopause Well on no HRT.  No PMB.  3. Malignant neoplasm of lower-outer quadrant of left breast of female, estrogen receptor positive ([place])  4. BRCA2 positive S/P BSO.  5. S/P BSO (bilateral salpingo-oophorectomy) With Dr Denman George.  6. Osteopenia of multiple sites Last BD in 2018.  Overdue, schedule BD here now. - DG Bone Density; Future   Princess Bruins MD, 9:53 AM 11/23/2020

## 2020-11-27 ENCOUNTER — Other Ambulatory Visit: Payer: Self-pay

## 2020-11-27 ENCOUNTER — Ambulatory Visit (INDEPENDENT_AMBULATORY_CARE_PROVIDER_SITE_OTHER): Payer: Medicare Other

## 2020-11-27 ENCOUNTER — Other Ambulatory Visit: Payer: Self-pay | Admitting: Obstetrics & Gynecology

## 2020-11-27 DIAGNOSIS — M810 Age-related osteoporosis without current pathological fracture: Secondary | ICD-10-CM | POA: Diagnosis not present

## 2020-11-27 DIAGNOSIS — Z780 Asymptomatic menopausal state: Secondary | ICD-10-CM

## 2020-11-27 DIAGNOSIS — M8589 Other specified disorders of bone density and structure, multiple sites: Secondary | ICD-10-CM

## 2020-12-28 ENCOUNTER — Inpatient Hospital Stay: Payer: Medicare Other

## 2021-01-01 ENCOUNTER — Other Ambulatory Visit (HOSPITAL_COMMUNITY): Payer: Self-pay

## 2021-01-01 ENCOUNTER — Encounter: Payer: Self-pay | Admitting: Obstetrics & Gynecology

## 2021-01-01 ENCOUNTER — Other Ambulatory Visit: Payer: Self-pay

## 2021-01-01 ENCOUNTER — Ambulatory Visit (INDEPENDENT_AMBULATORY_CARE_PROVIDER_SITE_OTHER): Payer: Medicare Other | Admitting: Obstetrics & Gynecology

## 2021-01-01 VITALS — BP 110/68 | HR 68

## 2021-01-01 DIAGNOSIS — M810 Age-related osteoporosis without current pathological fracture: Secondary | ICD-10-CM

## 2021-01-01 DIAGNOSIS — Z170 Estrogen receptor positive status [ER+]: Secondary | ICD-10-CM

## 2021-01-01 DIAGNOSIS — C50512 Malignant neoplasm of lower-outer quadrant of left female breast: Secondary | ICD-10-CM

## 2021-01-01 MED ORDER — ALENDRONATE SODIUM 70 MG PO TABS
70.0000 mg | ORAL_TABLET | ORAL | 4 refills | Status: DC
  Filled 2021-01-01 – 2021-05-03 (×2): qty 12, 84d supply, fill #0
  Filled 2021-07-21: qty 12, 84d supply, fill #1
  Filled 2021-10-13: qty 12, 84d supply, fill #2
  Filled 2021-12-29: qty 12, 84d supply, fill #3

## 2021-01-01 NOTE — Progress Notes (Signed)
    Cheryl [name] [birth year] [id number]        70 y.o.  G0  RP: Counseling and management of Osteoporosis  HPI: Bone Density 11/2020 showing Osteoporosis.  No current fracture.  No increased risk of falls.  Postmenopause on no HRT.  H/O Breast Ca on Anastrozole.  Low weight.   OB History  Gravida Para Term Preterm AB Living  0            SAB IAB Ectopic Multiple Live Births               Past medical history,surgical history, problem list, medications, allergies, family history and social history were all reviewed and documented in the EPIC chart.   Directed ROS with pertinent positives and negatives documented in the history of present illness/assessment and plan.  Exam:  Vitals:   01/01/21 0940  BP: 110/68  Pulse: 68   General appearance:  Normal  Bone Density 11/27/2020:  Osteoporosis. Bilateral hip with T-Score of -2.7.  Significant loss of BD since 2018.   Assessment/Plan:  70 y.o. G0P0   1. Age-related osteoporosis without current pathological fracture Counseling on osteoporosis.  Bone density August 2022 showed osteoporosis with bilateral T score at -2.7 at the hips.  Significant loss of bone density since 2018.  Patient has breast cancer on anastrozole.  Counseling done on the risks of fracture associated with osteoporosis.  Treatment reviewed thoroughly with patient.  Decision to start on Fosamax 70 mg per mouth weekly.  Benefits and risks reviewed.  Usage discussed.  Prescription sent to pharmacy.  Patient will continue on vitamin D supplements, calcium intake of 1.5 g/day total and regular weightbearing physical activities.  The importance of maintaining a normal weight also reviewed.  2. Malignant neoplasm of lower-outer quadrant of left breast of female, estrogen receptor positive ([place]) On Anastrozole.  Other orders - alendronate (FOSAMAX) 70 MG tablet; Take 1 tablet (70 mg total) by mouth every 7 (seven) days. Take with a full glass of water on an empty  stomach.   Princess Bruins MD, 10:18 AM 01/01/2021

## 2021-01-04 ENCOUNTER — Other Ambulatory Visit (HOSPITAL_COMMUNITY): Payer: Self-pay

## 2021-01-05 ENCOUNTER — Encounter: Payer: Self-pay | Admitting: Obstetrics & Gynecology

## 2021-01-08 ENCOUNTER — Other Ambulatory Visit: Payer: Self-pay | Admitting: Oncology

## 2021-01-08 ENCOUNTER — Other Ambulatory Visit (HOSPITAL_COMMUNITY): Payer: Self-pay

## 2021-01-08 MED ORDER — ANASTROZOLE 1 MG PO TABS
1.0000 mg | ORAL_TABLET | Freq: Every day | ORAL | 3 refills | Status: DC
  Filled 2021-01-08 – 2021-04-04 (×2): qty 90, 90d supply, fill #0
  Filled 2021-07-10: qty 90, 90d supply, fill #1
  Filled 2021-10-07: qty 90, 90d supply, fill #2

## 2021-01-08 MED FILL — Metformin HCl Tab ER 24HR 500 MG: ORAL | 90 days supply | Qty: 90 | Fill #1 | Status: AC

## 2021-01-19 DIAGNOSIS — Z23 Encounter for immunization: Secondary | ICD-10-CM | POA: Diagnosis not present

## 2021-02-08 ENCOUNTER — Ambulatory Visit: Payer: Medicare Other | Attending: Internal Medicine

## 2021-02-08 ENCOUNTER — Other Ambulatory Visit: Payer: Self-pay

## 2021-02-08 ENCOUNTER — Other Ambulatory Visit (HOSPITAL_BASED_OUTPATIENT_CLINIC_OR_DEPARTMENT_OTHER): Payer: Self-pay

## 2021-02-08 DIAGNOSIS — Z23 Encounter for immunization: Secondary | ICD-10-CM

## 2021-02-08 MED ORDER — PFIZER COVID-19 VAC BIVALENT 30 MCG/0.3ML IM SUSP
INTRAMUSCULAR | 0 refills | Status: DC
  Filled 2021-02-08: qty 0.3, 1d supply, fill #0

## 2021-02-08 NOTE — Progress Notes (Signed)
   Covid-19 Vaccination Clinic  Name:  Aleya Durnell Rinker    MRN: [id number] DOB: [birth year]  02/08/2021  Ms. Foresta was observed post Covid-19 immunization for 15 minutes without incident. She was provided with Vaccine Information Sheet and instruction to access the V-Safe system.   Ms. Talavera was instructed to call 911 with any severe reactions post vaccine: Difficulty breathing  Swelling of face and throat  A fast heartbeat  A bad rash all over body  Dizziness and weakness   Immunizations Administered     Name Date Dose VIS Date Route   Pfizer Covid-19 Vaccine Bivalent Booster 02/08/2021 10:04 AM 0.3 mL 12/19/2020 Intramuscular   Manufacturer: [place]   Lot: [id number]   [place]: [phone]

## 2021-02-18 ENCOUNTER — Other Ambulatory Visit: Payer: Self-pay | Admitting: Cardiology

## 2021-02-18 ENCOUNTER — Other Ambulatory Visit (HOSPITAL_BASED_OUTPATIENT_CLINIC_OR_DEPARTMENT_OTHER): Payer: Self-pay

## 2021-02-18 MED ORDER — EZETIMIBE 10 MG PO TABS
ORAL_TABLET | Freq: Every day | ORAL | 3 refills | Status: DC
  Filled 2021-02-18: qty 90, fill #0
  Filled 2021-03-13: qty 90, 90d supply, fill #0
  Filled 2021-07-04: qty 90, 90d supply, fill #1
  Filled 2021-10-07: qty 90, 90d supply, fill #2

## 2021-02-18 MED ORDER — PROPRANOLOL HCL ER 80 MG PO CP24
80.0000 mg | ORAL_CAPSULE | Freq: Every day | ORAL | 3 refills | Status: DC
  Filled 2021-02-18: qty 90, 90d supply, fill #0
  Filled 2021-05-20: qty 90, 90d supply, fill #1
  Filled 2021-08-19: qty 90, 90d supply, fill #2
  Filled 2021-11-17: qty 90, 90d supply, fill #3

## 2021-02-19 ENCOUNTER — Other Ambulatory Visit (HOSPITAL_BASED_OUTPATIENT_CLINIC_OR_DEPARTMENT_OTHER): Payer: Self-pay

## 2021-02-19 ENCOUNTER — Encounter (HOSPITAL_BASED_OUTPATIENT_CLINIC_OR_DEPARTMENT_OTHER): Payer: Self-pay | Admitting: Pharmacist

## 2021-02-20 ENCOUNTER — Other Ambulatory Visit (HOSPITAL_BASED_OUTPATIENT_CLINIC_OR_DEPARTMENT_OTHER): Payer: Self-pay

## 2021-02-21 DIAGNOSIS — H25013 Cortical age-related cataract, bilateral: Secondary | ICD-10-CM | POA: Diagnosis not present

## 2021-02-21 DIAGNOSIS — E119 Type 2 diabetes mellitus without complications: Secondary | ICD-10-CM | POA: Diagnosis not present

## 2021-02-21 DIAGNOSIS — H2513 Age-related nuclear cataract, bilateral: Secondary | ICD-10-CM | POA: Diagnosis not present

## 2021-02-21 DIAGNOSIS — H5203 Hypermetropia, bilateral: Secondary | ICD-10-CM | POA: Diagnosis not present

## 2021-03-13 ENCOUNTER — Other Ambulatory Visit (HOSPITAL_BASED_OUTPATIENT_CLINIC_OR_DEPARTMENT_OTHER): Payer: Self-pay

## 2021-04-04 ENCOUNTER — Other Ambulatory Visit (HOSPITAL_BASED_OUTPATIENT_CLINIC_OR_DEPARTMENT_OTHER): Payer: Self-pay

## 2021-04-04 MED FILL — Metformin HCl Tab ER 24HR 500 MG: ORAL | 90 days supply | Qty: 90 | Fill #0 | Status: AC

## 2021-04-23 ENCOUNTER — Other Ambulatory Visit: Payer: Self-pay | Admitting: Oncology

## 2021-04-23 DIAGNOSIS — Z9011 Acquired absence of right breast and nipple: Secondary | ICD-10-CM

## 2021-04-23 DIAGNOSIS — Z853 Personal history of malignant neoplasm of breast: Secondary | ICD-10-CM

## 2021-05-03 ENCOUNTER — Other Ambulatory Visit (HOSPITAL_BASED_OUTPATIENT_CLINIC_OR_DEPARTMENT_OTHER): Payer: Self-pay

## 2021-05-08 DIAGNOSIS — E1169 Type 2 diabetes mellitus with other specified complication: Secondary | ICD-10-CM | POA: Diagnosis not present

## 2021-05-08 DIAGNOSIS — I890 Lymphedema, not elsewhere classified: Secondary | ICD-10-CM | POA: Diagnosis not present

## 2021-05-08 DIAGNOSIS — C50911 Malignant neoplasm of unspecified site of right female breast: Secondary | ICD-10-CM | POA: Diagnosis not present

## 2021-05-08 DIAGNOSIS — M8580 Other specified disorders of bone density and structure, unspecified site: Secondary | ICD-10-CM | POA: Diagnosis not present

## 2021-05-14 ENCOUNTER — Inpatient Hospital Stay: Payer: Medicare Other | Admitting: Oncology

## 2021-05-17 ENCOUNTER — Inpatient Hospital Stay: Payer: Medicare Other | Attending: Oncology | Admitting: Oncology

## 2021-05-17 ENCOUNTER — Other Ambulatory Visit (HOSPITAL_BASED_OUTPATIENT_CLINIC_OR_DEPARTMENT_OTHER): Payer: Self-pay

## 2021-05-17 ENCOUNTER — Other Ambulatory Visit: Payer: Self-pay

## 2021-05-17 VITALS — BP 121/74 | HR 74 | Temp 97.8°F | Resp 18 | Ht 60.0 in | Wt 106.0 lb

## 2021-05-17 DIAGNOSIS — C50512 Malignant neoplasm of lower-outer quadrant of left female breast: Secondary | ICD-10-CM

## 2021-05-17 DIAGNOSIS — Z923 Personal history of irradiation: Secondary | ICD-10-CM | POA: Insufficient documentation

## 2021-05-17 DIAGNOSIS — M8580 Other specified disorders of bone density and structure, unspecified site: Secondary | ICD-10-CM | POA: Diagnosis not present

## 2021-05-17 DIAGNOSIS — Z803 Family history of malignant neoplasm of breast: Secondary | ICD-10-CM | POA: Insufficient documentation

## 2021-05-17 DIAGNOSIS — Z170 Estrogen receptor positive status [ER+]: Secondary | ICD-10-CM | POA: Diagnosis not present

## 2021-05-17 DIAGNOSIS — C50912 Malignant neoplasm of unspecified site of left female breast: Secondary | ICD-10-CM | POA: Insufficient documentation

## 2021-05-17 DIAGNOSIS — Z79811 Long term (current) use of aromatase inhibitors: Secondary | ICD-10-CM | POA: Insufficient documentation

## 2021-05-17 MED ORDER — PEG 3350-KCL-NA BICARB-NACL 420 G PO SOLR
ORAL | 0 refills | Status: DC
  Filled 2021-05-17: qty 4000, 1d supply, fill #0

## 2021-05-17 NOTE — Progress Notes (Signed)
[facility] OFFICE PROGRESS NOTE   Diagnosis: Breast cancer  INTERVAL HISTORY:   [name] returns as scheduled.  She continues anastrozole.  No change at the left breast or right chest wall.  No hot flashes or significant arthralgias.  She feels well.  She is scheduled for mammogram in 2 weeks.  Objective:  Vital signs in last 24 hours:  Blood pressure 121/74, pulse 74, temperature 97.8 F (36.6 C), temperature source Oral, resp. rate 18, height 5' (1.524 m), SpO2 98 %.    Lymphatics: No cervical, supraclavicular, or axillary nodes Resp: Lungs clear bilaterally Cardio: Regular rate and rhythm, 2/6 systolic murmur GI: No hepatomegaly Vascular: No leg edema, right arm lymphedema sleeve in place Breast: Right mastectomy with TRAM reconstruction.  Nodular tissue at the medial aspect of the TRAM.  Left breast without mass.    Medications: I have reviewed the patient's current medications.   Assessment/Plan:  1.Stage III right-sided breast cancer diagnosed in 1999. She remains in clinical remission. She completed adjuvant Femara therapy at the end of August 2009.   2. Left renal lesion on CT scan in April 2008 - a renal ultrasound confirmed bilateral renal cysts.  Followed by Dr. Diona Fanti with repeat imaging to follow multiple renal cysts 3. Osteopenia - she continues vitamin D.   4. 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   5. BRCA2 variant of unclear clinical significance, with a significant family history of breast cancer.  Negative breast/ovarian gene panel 06/15/2015 6. History of hematuria - reports being diagnosed with a renal stone by Dr Diona Fanti.  7. Left breast cancer-invasive lobular carcinoma, HER-2 negative, ER positive, PR positive, Ki-20 percent confirmed on a core biopsy of a left lower outer quadrant mass on 03/10/2016 Left lumpectomy an sentinel lymph node biopsy01/17/2018,1.5 cm lobular carcinoma, grade 2,pT1c,pN0 Oncotype-score  17, low risk Initiation of adjuvant left breast radiation 06/12/2016, completed 07/28/2016 Initiation of adjuvant Arimidex  07/28/2016   8.  Small pancreas cysts noted on MRI 06/25/2017-stable on follow-up MRI 11/27/2018  9.   Left pleural thickening at the major fissure noted on the [facility] CT 06/11/2017-I reviewed the CT images and radiology, this area of thickening was present on a CT from 2008 and is slightly "thicker ".  Low clinical suspicion for malignancy. 10.  Diabetes     Disposition: Cheryl [name] is in clinical remission from breast cancer.  The plan is to continue anastrozole for at least 7 years.  She is scheduled for mammogram in 2 weeks.  She will return for an office visit in 9 months.  Betsy Coder, MD  05/17/2021  8:12 AM

## 2021-05-20 ENCOUNTER — Other Ambulatory Visit (HOSPITAL_BASED_OUTPATIENT_CLINIC_OR_DEPARTMENT_OTHER): Payer: Self-pay

## 2021-05-21 ENCOUNTER — Other Ambulatory Visit (HOSPITAL_BASED_OUTPATIENT_CLINIC_OR_DEPARTMENT_OTHER): Payer: Self-pay

## 2021-05-28 ENCOUNTER — Ambulatory Visit
Admission: RE | Admit: 2021-05-28 | Discharge: 2021-05-28 | Disposition: A | Discharge status: Home / Self Care | Payer: Medicare Other | Source: Ambulatory Visit | Attending: Oncology | Admitting: Oncology

## 2021-05-28 DIAGNOSIS — Z853 Personal history of malignant neoplasm of breast: Secondary | ICD-10-CM | POA: Diagnosis not present

## 2021-05-28 DIAGNOSIS — Z9011 Acquired absence of right breast and nipple: Secondary | ICD-10-CM

## 2021-05-28 DIAGNOSIS — R922 Inconclusive mammogram: Secondary | ICD-10-CM | POA: Diagnosis not present

## 2021-05-31 DIAGNOSIS — D120 Benign neoplasm of cecum: Secondary | ICD-10-CM | POA: Diagnosis not present

## 2021-05-31 DIAGNOSIS — Z86010 Personal history of colonic polyps: Secondary | ICD-10-CM | POA: Diagnosis not present

## 2021-05-31 DIAGNOSIS — K649 Unspecified hemorrhoids: Secondary | ICD-10-CM | POA: Diagnosis not present

## 2021-06-05 DIAGNOSIS — D120 Benign neoplasm of cecum: Secondary | ICD-10-CM | POA: Diagnosis not present

## 2021-07-04 ENCOUNTER — Other Ambulatory Visit (HOSPITAL_BASED_OUTPATIENT_CLINIC_OR_DEPARTMENT_OTHER): Payer: Self-pay

## 2021-07-10 ENCOUNTER — Other Ambulatory Visit (HOSPITAL_BASED_OUTPATIENT_CLINIC_OR_DEPARTMENT_OTHER): Payer: Self-pay

## 2021-07-11 ENCOUNTER — Other Ambulatory Visit (HOSPITAL_BASED_OUTPATIENT_CLINIC_OR_DEPARTMENT_OTHER): Payer: Self-pay

## 2021-07-15 ENCOUNTER — Other Ambulatory Visit (HOSPITAL_BASED_OUTPATIENT_CLINIC_OR_DEPARTMENT_OTHER): Payer: Self-pay

## 2021-07-15 MED ORDER — METFORMIN HCL ER 500 MG PO TB24
500.0000 mg | ORAL_TABLET | Freq: Every day | ORAL | 3 refills | Status: DC
  Filled 2021-07-15: qty 90, 90d supply, fill #0
  Filled 2021-10-13: qty 90, 90d supply, fill #1
  Filled 2022-01-12: qty 90, 90d supply, fill #2
  Filled 2022-04-02: qty 90, 90d supply, fill #3

## 2021-07-22 ENCOUNTER — Other Ambulatory Visit (HOSPITAL_BASED_OUTPATIENT_CLINIC_OR_DEPARTMENT_OTHER): Payer: Self-pay

## 2021-08-19 ENCOUNTER — Other Ambulatory Visit (HOSPITAL_BASED_OUTPATIENT_CLINIC_OR_DEPARTMENT_OTHER): Payer: Self-pay

## 2021-08-22 ENCOUNTER — Other Ambulatory Visit (HOSPITAL_BASED_OUTPATIENT_CLINIC_OR_DEPARTMENT_OTHER): Payer: Self-pay

## 2021-10-07 ENCOUNTER — Other Ambulatory Visit (HOSPITAL_BASED_OUTPATIENT_CLINIC_OR_DEPARTMENT_OTHER): Payer: Self-pay

## 2021-10-08 ENCOUNTER — Other Ambulatory Visit (HOSPITAL_BASED_OUTPATIENT_CLINIC_OR_DEPARTMENT_OTHER): Payer: Self-pay

## 2021-10-14 ENCOUNTER — Other Ambulatory Visit (HOSPITAL_BASED_OUTPATIENT_CLINIC_OR_DEPARTMENT_OTHER): Payer: Self-pay

## 2021-10-14 NOTE — Progress Notes (Signed)
Cardiology Office Note    Date:  10/17/2021   ID:  Cheryl [name], DOB 08/04/1950, MRN [id number]  PCP:  Burnard Bunting, MD  Cardiologist:  Chea Malan Martinique, MD    History of Present Illness:  Cheryl [name] is a 71 y.o. female seen for follow up. She has a  history of PVCs and hyperlipidemia.  She had normal stress Echos done in 2009 and 2012. Repeat Echo in 2015 and 2018 were normal. She has a history of PVCs.   She is s/p mastectomy for breast CA and does have a  history of Adriamycin use.    She has chronic lymphedema in her right arm.  She takes Inderal for migraine HAs.   She was diagnosed with left breast CA and underwent lumpectomy in January 2018 and has completed RT. She is now on hormonal therapy. She underwent bilateral BSO in September 2018 due to positive BRCA2 gene mutation. She has a strong family history of heart   On follow up today she remains very active walking, doing yoga, and her own yardwork. No chest pain or SOB. No edema. No palpitations.    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   History of external beam radiation therapy 06/12/16-07/28/16   left breast 50.4 Gy in 28 fractions, left breast boost 10 Gy in 5 fractions   History of hiatal hernia    History of kidney stones    History of migraine    Hyperlipidemia    Kidney problem 1969   history of left kidney bleeding   1969   Osteopenia Valve/2018   T score -2.2 FRAX recognizing prior Fosamax use 10% / 1.6% overall stable from prior DEXA   Personal history of radiation therapy 2018   Personal history of  radiation therapy 1999   Strep throat    age 70   Ventricular ectopy    chronic    Past Surgical History:  Procedure Laterality Date   BREAST LUMPECTOMY Left 04/2016   BREAST LUMPECTOMY WITH RADIOACTIVE SEED AND SENTINEL LYMPH NODE BIOPSY Left 05/07/2016   Procedure: LEFT BREAST LUMPECTOMY WITH RADIOACTIVE SEED AND LEFT AXILLARY SENTINEL LYMPH NODE BIOPSY;  Surgeon: Fanny Skates, MD;  Location: [facility];  Service: General;  Laterality: Left;   BREAST SURGERY     Mastectomy with TRAM   CYSTOGRAM     FINGER SURGERY Right 01/1999   finger on the hand   FOOT SURGERY  2004,2015   FOOT SURGERY Left 07/2005   3 toes   left thumb  04/2004   joint surgery bil thumbs   MASTECTOMY Right 1999   TRAM   right mastectomy  10-27-97   with tram flap   right thumb  1/05   ROBOTIC ASSISTED BILATERAL SALPINGO OOPHERECTOMY Bilateral 01/13/2017   Procedure: XI ROBOTIC ASSISTED BILATERAL [place];  Surgeon: Everitt Amber, MD;  Location: [facility];  Service: Gynecology;  Laterality: Bilateral;   TONSILLECTOMY AND ADENOIDECTOMY      Current  Medications: Outpatient Medications Prior to Visit  Medication Sig Dispense Refill   alendronate (FOSAMAX) 70 MG tablet Take 1 tablet (70 mg total) by mouth every 7 (seven) days. Take with a full glass of water on an empty stomach. 12 tablet 4   anastrozole (ARIMIDEX) 1 MG tablet Take 1 tablet (1 mg total) by mouth daily. 90 tablet 3   cephALEXin (KEFLEX) 500 MG capsule Take 500 mg by mouth 4 (four) times daily as needed. cellulitis flare up.     Coenzyme Q10 (COQ10) 100 MG CAPS Take 200 mg by mouth daily.     COVID-19 mRNA bivalent vaccine, Pfizer, (PFIZER COVID-19 VAC BIVALENT) injection Inject into the muscle. 0.3 mL 0   ezetimibe (ZETIA) 10 MG tablet TAKE 1 TABLET BY MOUTH DAILY. 90 tablet 3   famotidine (PEPCID) 20 MG tablet Take 20 mg by mouth 2 (two) times daily.      fexofenadine (ALLEGRA) 180 MG tablet Take 180 mg by  mouth daily as needed (for sinus/allergies.).     metFORMIN (GLUCOPHAGE-XR) 500 MG 24 hr tablet TAKE 1 TABLET BY MOUTH ONCE DAILY. 90 tablet 3   Multiple Vitamin (MULTI-VITAMIN DAILY PO) Take 1 tablet by mouth daily.     propranolol ER (INDERAL LA) 80 MG 24 hr capsule TAKE 1 CAPSULE BY MOUTH ONCE DAILY 90 capsule 3   rizatriptan (MAXALT) 10 MG tablet Take 10 mg by mouth as needed. May repeat in 2 hours if needed     rosuvastatin (CRESTOR) 40 MG tablet Take 1 tablet (40 mg total) by mouth daily. 90 tablet 3   VITAMIN D, ERGOCALCIFEROL, PO Take 1-2 drops by mouth See admin instructions. 2 drops in the morning & 1 drop in the evening   1 drop = 2000 iu     metFORMIN (GLUCOPHAGE-XR) 500 MG 24 hr tablet TAKE 1 TABLET BY MOUTH ONCE DAILY. 90 tablet 3   polyethylene glycol-electrolytes (NULYTELY) 420 g solution Use as directed 4000 mL 0   No facility-administered medications prior to visit.     Allergies:   Methantheline, Demerol, and Theophyllines   Social History   Socioeconomic History   Marital status: Single    Spouse name: Not on file   Number of children: Not on file   Years of education: Not on file   Highest education level: Not on file  Occupational History   Occupation: Nurse    Employer: [employer]  Tobacco Use   Smoking status: Never   Smokeless tobacco: Never  Vaping Use   Vaping Use: Never used  Substance and Sexual Activity   Alcohol use: Not Currently   Drug use: No   Sexual activity: Never    Comment:  [place]  Other Topics Concern   Not on file  Social History Narrative   Not on file   Social Determinants of Health   Financial Resource Strain: Not on file  Food Insecurity: Not on file  Transportation Needs: Not on file  Physical Activity: Not on file  Stress: Not on file  Social Connections: Not on file     Family History:  The patient's family history includes Breast cancer in some other family members; Breast cancer (age of onset: 53) in her mother;  Dementia in her father; Heart attack in her paternal grandfather; Heart disease in her father; Hyperlipidemia in her sister; Hypertension in her father; Prostate cancer in an other family member; Tuberculosis in her paternal grandmother.   ROS:   Please see the history  of present illness.    ROS All other systems reviewed and are negative.   PHYSICAL EXAM:   VS:  BP 130/72   Pulse 69   Ht 5' (1.524 m)   Wt 106 lb (48.1 kg)   SpO2 94%   BMI 20.70 kg/m    GENERAL:  Well appearing WF in NAD HEENT:  PERRL, EOMI, sclera are clear. Oropharynx is clear. NECK:  No jugular venous distention, carotid upstroke brisk and symmetric, no bruits, no thyromegaly or adenopathy LUNGS:  Clear to auscultation bilaterally CHEST:  Unremarkable HEART:  RRR,  PMI not displaced or sustained,S1 and S2 within normal limits, no S3, no S4: no clicks, no rubs, no murmurs ABD:  Soft, nontender. BS +, no masses or bruits. No hepatomegaly, no splenomegaly EXT:  2 + pulses throughout, no edema, no cyanosis no clubbing, has a compression sleeve on right arm. SKIN:  Warm and dry.  No rashes NEURO:  Alert and oriented x 3. Cranial nerves II through XII intact. PSYCH:  Cognitively intact    Wt Readings from Last 3 Encounters:  10/17/21 106 lb (48.1 kg)  05/17/21 106 lb (48.1 kg)  11/23/20 99 lb (44.9 kg)      Studies/Labs Reviewed:   EKG:  EKG is ordered today.  It demonstrates NSR with LAD and incomplete RBBB.  Rate 69. I have personally reviewed and interpreted this study.   Recent Labs: No results found for requested labs within last 365 days.   Lipid Panel    Component Value Date/Time   CHOL 168 04/18/2014 0852   TRIG 132.0 04/18/2014 0852   HDL 62.60 04/18/2014 0852   CHOLHDL 3 04/18/2014 0852   VLDL 26.4 04/18/2014 0852   LDLCALC 79 04/18/2014 0852   Labs dated 08/18/15: cholesterol 193, triglycerides 118, HDL 59, LDL 110. TSH normal Dated 09/03/16: cholesterol 184, triglycerides 101, HDL 67, LDL  97.  Dated 09/27/19: cholesterol 149, triglycerides 72, HDL 69, LDL 66. A1c 6.7%. CBC, CMET, TSH normal. Dated 10/03/20: cholesterol 144, triglycerides 86, HDL 76, LDL 51. A1c 6.7%. CMET and TSH normal.  Additional studies/ records that were reviewed today include:  Echo 5/14/18Study Conclusions   - Left ventricle: The cavity size was normal. Systolic function was   normal. The estimated ejection fraction was in the range of 60%   to 65%. Wall motion was normal; there were no regional wall   motion abnormalities. Doppler parameters are consistent with   abnormal left ventricular relaxation (grade 1 diastolic   dysfunction). Doppler parameters are consistent with mildly   elevated ventricular end-diastolic filling pressure. - Aortic valve: There was mild regurgitation. - Aortic root: The aortic root was normal in size. - Right ventricle: The cavity size was normal. Wall thickness was   normal. Systolic function was normal. - Tricuspid valve: There was no regurgitation. - Pulmonary arteries: Systolic pressure was within the normal   range. - Inferior vena cava: The vessel was normal in size. - Pericardium, extracardiac: There was no pericardial effusion.   Impressions:   - Global longitudinal strain: -23%, unchanged from prior on   11/21/2013.   Lateral S prime: 11 cm/sec.  PLAN:     1. Hyperlipidemia- on Crestor and Zetia- excellent control. She is due for follow up lab work with Dr Reynaldo Minium.   2. PVCs- asymptomatic   3. history of Breast CA and is s/p chemoRx. She has also had RT x 2.  stable.    Follow up in one year.  Medication Adjustments/Labs and Tests Ordered: Current medicines are reviewed at length with the patient today.  Concerns regarding medicines are outlined above.  Medication changes, Labs and Tests ordered today are listed in the Patient Instructions below. There are no Patient Instructions on file for this visit.   Signed, Zilla Shartzer Martinique, MD  10/17/2021  8:31 AM    [facility] [address] [place], [place], [address] [phone]

## 2021-10-17 ENCOUNTER — Encounter: Payer: Self-pay | Admitting: Cardiology

## 2021-10-17 ENCOUNTER — Ambulatory Visit (INDEPENDENT_AMBULATORY_CARE_PROVIDER_SITE_OTHER): Payer: Medicare Other | Admitting: Cardiology

## 2021-10-17 ENCOUNTER — Other Ambulatory Visit (HOSPITAL_BASED_OUTPATIENT_CLINIC_OR_DEPARTMENT_OTHER): Payer: Self-pay

## 2021-10-17 VITALS — BP 130/72 | HR 69 | Ht 60.0 in | Wt 106.0 lb

## 2021-10-17 DIAGNOSIS — E785 Hyperlipidemia, unspecified: Secondary | ICD-10-CM | POA: Diagnosis not present

## 2021-10-17 DIAGNOSIS — I493 Ventricular premature depolarization: Secondary | ICD-10-CM | POA: Diagnosis not present

## 2021-10-17 MED ORDER — ROSUVASTATIN CALCIUM 40 MG PO TABS
40.0000 mg | ORAL_TABLET | Freq: Every day | ORAL | 3 refills | Status: DC
Start: 2021-10-17 — End: 2022-12-05
  Filled 2021-10-17 – 2022-01-12 (×2): qty 90, 90d supply, fill #0
  Filled 2022-05-05: qty 90, 90d supply, fill #1
  Filled 2022-08-31: qty 90, 90d supply, fill #2

## 2021-10-17 MED ORDER — EZETIMIBE 10 MG PO TABS
10.0000 mg | ORAL_TABLET | Freq: Every day | ORAL | 3 refills | Status: DC
Start: 2021-10-17 — End: 2022-12-05
  Filled 2021-10-17 – 2022-01-12 (×3): qty 90, 90d supply, fill #0
  Filled 2022-05-05: qty 90, 90d supply, fill #1
  Filled 2022-08-31: qty 90, 90d supply, fill #2

## 2021-10-30 DIAGNOSIS — R82998 Other abnormal findings in urine: Secondary | ICD-10-CM | POA: Diagnosis not present

## 2021-10-31 DIAGNOSIS — R739 Hyperglycemia, unspecified: Secondary | ICD-10-CM | POA: Diagnosis not present

## 2021-10-31 DIAGNOSIS — E785 Hyperlipidemia, unspecified: Secondary | ICD-10-CM | POA: Diagnosis not present

## 2021-10-31 DIAGNOSIS — M859 Disorder of bone density and structure, unspecified: Secondary | ICD-10-CM | POA: Diagnosis not present

## 2021-10-31 DIAGNOSIS — R7989 Other specified abnormal findings of blood chemistry: Secondary | ICD-10-CM | POA: Diagnosis not present

## 2021-11-13 ENCOUNTER — Other Ambulatory Visit (HOSPITAL_BASED_OUTPATIENT_CLINIC_OR_DEPARTMENT_OTHER): Payer: Self-pay

## 2021-11-13 DIAGNOSIS — M8580 Other specified disorders of bone density and structure, unspecified site: Secondary | ICD-10-CM | POA: Diagnosis not present

## 2021-11-13 DIAGNOSIS — I890 Lymphedema, not elsewhere classified: Secondary | ICD-10-CM | POA: Diagnosis not present

## 2021-11-13 DIAGNOSIS — E1169 Type 2 diabetes mellitus with other specified complication: Secondary | ICD-10-CM | POA: Diagnosis not present

## 2021-11-13 DIAGNOSIS — Z1339 Encounter for screening examination for other mental health and behavioral disorders: Secondary | ICD-10-CM | POA: Diagnosis not present

## 2021-11-13 DIAGNOSIS — C50911 Malignant neoplasm of unspecified site of right female breast: Secondary | ICD-10-CM | POA: Diagnosis not present

## 2021-11-13 DIAGNOSIS — E785 Hyperlipidemia, unspecified: Secondary | ICD-10-CM | POA: Diagnosis not present

## 2021-11-13 DIAGNOSIS — G43909 Migraine, unspecified, not intractable, without status migrainosus: Secondary | ICD-10-CM | POA: Diagnosis not present

## 2021-11-13 DIAGNOSIS — Z1331 Encounter for screening for depression: Secondary | ICD-10-CM | POA: Diagnosis not present

## 2021-11-13 DIAGNOSIS — Z0000 Encounter for general adult medical examination without abnormal findings: Secondary | ICD-10-CM | POA: Diagnosis not present

## 2021-11-13 MED ORDER — RIZATRIPTAN BENZOATE 10 MG PO TBDP
ORAL_TABLET | ORAL | 1 refills | Status: AC
  Filled 2021-11-13: qty 18, 45d supply, fill #0

## 2021-11-18 ENCOUNTER — Other Ambulatory Visit (HOSPITAL_BASED_OUTPATIENT_CLINIC_OR_DEPARTMENT_OTHER): Payer: Self-pay

## 2021-11-18 DIAGNOSIS — I7300 Raynaud's syndrome without gangrene: Secondary | ICD-10-CM | POA: Diagnosis not present

## 2021-11-18 DIAGNOSIS — M79601 Pain in right arm: Secondary | ICD-10-CM | POA: Diagnosis not present

## 2021-11-18 DIAGNOSIS — I890 Lymphedema, not elsewhere classified: Secondary | ICD-10-CM | POA: Diagnosis not present

## 2021-11-18 MED ORDER — CEPHALEXIN 500 MG PO CAPS
ORAL_CAPSULE | ORAL | 4 refills | Status: AC
  Filled 2021-11-18: qty 56, 14d supply, fill #0
  Filled 2022-04-18 (×2): qty 56, 14d supply, fill #1
  Filled 2022-10-13: qty 56, 14d supply, fill #2

## 2021-12-26 ENCOUNTER — Other Ambulatory Visit (HOSPITAL_BASED_OUTPATIENT_CLINIC_OR_DEPARTMENT_OTHER): Payer: Self-pay

## 2021-12-29 ENCOUNTER — Other Ambulatory Visit: Payer: Self-pay | Admitting: Oncology

## 2021-12-30 ENCOUNTER — Other Ambulatory Visit (HOSPITAL_BASED_OUTPATIENT_CLINIC_OR_DEPARTMENT_OTHER): Payer: Self-pay

## 2021-12-30 MED ORDER — ANASTROZOLE 1 MG PO TABS
1.0000 mg | ORAL_TABLET | Freq: Every day | ORAL | 3 refills | Status: DC
Start: 2021-12-30 — End: 2022-12-05
  Filled 2021-12-30: qty 90, 90d supply, fill #0
  Filled 2022-04-02: qty 90, 90d supply, fill #1
  Filled 2022-06-29: qty 90, 90d supply, fill #2
  Filled 2022-09-27: qty 90, 90d supply, fill #3

## 2022-01-01 ENCOUNTER — Other Ambulatory Visit (HOSPITAL_BASED_OUTPATIENT_CLINIC_OR_DEPARTMENT_OTHER): Payer: Self-pay

## 2022-01-01 MED ORDER — AREXVY 120 MCG/0.5ML IM SUSR
INTRAMUSCULAR | 0 refills | Status: DC
  Filled 2022-01-01: qty 0.5, 1d supply, fill #0

## 2022-01-12 ENCOUNTER — Other Ambulatory Visit (HOSPITAL_BASED_OUTPATIENT_CLINIC_OR_DEPARTMENT_OTHER): Payer: Self-pay

## 2022-01-13 ENCOUNTER — Other Ambulatory Visit (HOSPITAL_BASED_OUTPATIENT_CLINIC_OR_DEPARTMENT_OTHER): Payer: Self-pay

## 2022-01-29 ENCOUNTER — Other Ambulatory Visit (HOSPITAL_BASED_OUTPATIENT_CLINIC_OR_DEPARTMENT_OTHER): Payer: Self-pay

## 2022-01-29 DIAGNOSIS — Z23 Encounter for immunization: Secondary | ICD-10-CM | POA: Diagnosis not present

## 2022-01-29 MED ORDER — COVID-19 MRNA 2023-2024 VACCINE (COMIRNATY) 0.3 ML INJECTION
INTRAMUSCULAR | 0 refills | Status: DC
  Filled 2022-01-29: qty 0.3, 1d supply, fill #0

## 2022-02-01 DIAGNOSIS — Z23 Encounter for immunization: Secondary | ICD-10-CM | POA: Diagnosis not present

## 2022-02-11 ENCOUNTER — Ambulatory Visit: Payer: Medicare Other | Admitting: Oncology

## 2022-02-13 ENCOUNTER — Inpatient Hospital Stay: Payer: Medicare Other | Attending: Oncology | Admitting: Oncology

## 2022-02-13 VITALS — BP 119/64 | HR 68 | Temp 98.1°F | Resp 18 | Ht 60.0 in | Wt 106.2 lb

## 2022-02-13 DIAGNOSIS — C50512 Malignant neoplasm of lower-outer quadrant of left female breast: Secondary | ICD-10-CM

## 2022-02-13 DIAGNOSIS — Z170 Estrogen receptor positive status [ER+]: Secondary | ICD-10-CM

## 2022-02-13 DIAGNOSIS — M8580 Other specified disorders of bone density and structure, unspecified site: Secondary | ICD-10-CM | POA: Diagnosis not present

## 2022-02-13 NOTE — Progress Notes (Signed)
  [facility] OFF[facility] PROGRESS NOTE   Diagnosis: Breast cancer  INTERVAL HISTORY:   Cheryl [name] turns as scheduled.  She continues anastrozole.  No new complaint.  She had an episode of right arm cellulitis last spring.  Left mammogram was negative on 05/28/2021.  Objective:  Vital signs in last 24 hours:  Blood pressure 119/64, pulse 68, temperature 98.1 F (36.7 C), temperature source Oral, resp. rate 18, height 5' (1.524 m), weight 106 lb 3.2 oz (48.2 kg), SpO2 100 %.    HEENT: Neck without mass Lymphatics: No cervical, supraclavicular, or axillary nodes Resp: Lungs clear bilaterally Cardio: Regular rate and rhythm GI: No hepatomegaly Vascular: No leg edema, mild edema of the right arm with a lymphedema sleeve in place Breast: Right mastectomy with a TRAM reconstruction.  Firm nodular tissue at the medial aspect of the TRAM.  Left breast without mass.  Lab Results:  Lab Results  Component Value Date   WBC 4.2 01/09/2017   HGB 12.3 01/09/2017   HCT 35.0 (L) 01/09/2017   MCV 89.3 01/09/2017   PLT 152 01/09/2017   NEUTROABS 4.1 04/29/2016    CMP  Lab Results  Component Value Date   NA 139 01/09/2017   K 4.2 01/09/2017   CL 104 01/09/2017   CO2 27 01/09/2017   GLUCOSE 123 (H) 01/09/2017   BUN 16 01/09/2017   CREATININE 0.65 01/09/2017   CALCIUM 9.5 01/09/2017   PROT 7.2 01/09/2017   ALBUMIN 4.4 01/09/2017   AST 28 01/09/2017   ALT 25 01/09/2017   ALKPHOS 73 01/09/2017   BILITOT 1.1 01/09/2017   GFRNONAA >60 01/09/2017   GFRAA >60 01/09/2017     Medications: I have reviewed the patient's current medications.   Assessment/Plan: 1.Stage III right-sided breast cancer diagnosed in 1999. She remains in clinical remission. She completed adjuvant Femara therapy at the end of August 2009.   2. Left renal lesion on CT scan in April 2008 - a renal ultrasound confirmed bilateral renal cysts.  Followed by Dr. Diona Fanti with repeat imaging to follow  multiple renal cysts 3. Osteopenia - she continues vitamin D.   4. 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   5. BRCA2 variant of unclear clinical significance, with a significant family history of breast cancer.  Negative breast/ovarian gene panel 06/15/2015 6. History of hematuria - reports being diagnosed with a renal stone by Dr Diona Fanti.  7. Left breast cancer-invasive lobular carcinoma, HER-2 negative, ER positive, PR positive, Ki-20 percent confirmed on a core biopsy of a left lower outer quadrant mass on 03/10/2016 Left lumpectomy an sentinel lymph node biopsy01/17/2018,1.5 cm lobular carcinoma, grade 2,pT1c,pN0 Oncotype-score 17, low risk Initiation of adjuvant left breast radiation 06/12/2016, completed 07/28/2016 Initiation of adjuvant Arimidex  07/28/2016   8.  Small pancreas cysts noted on MRI 06/25/2017-stable on follow-up MRI 11/27/2018  9.   Left pleural thickening at the major fissure noted on the [facility] CT 06/11/2017-I reviewed the CT images and radiology, this area of thickening was present on a CT from 2008 and is slightly "thicker ".  Low clinical suspicion for malignancy. 10.  Diabetes      Disposition: Cheryl [name] remains in clinical remission from breast cancer.  She continues anastrozole.  She will schedule a mammogram for February 2024.  She will return for an office visit in 9 months.  She will continue bone density can follow-up with gynecology.  Cheryl Coder, MD  02/13/2022  9:43 AM

## 2022-02-17 ENCOUNTER — Other Ambulatory Visit (HOSPITAL_BASED_OUTPATIENT_CLINIC_OR_DEPARTMENT_OTHER): Payer: Self-pay

## 2022-02-17 DIAGNOSIS — E1169 Type 2 diabetes mellitus with other specified complication: Secondary | ICD-10-CM | POA: Diagnosis not present

## 2022-02-17 DIAGNOSIS — I890 Lymphedema, not elsewhere classified: Secondary | ICD-10-CM | POA: Diagnosis not present

## 2022-02-17 MED ORDER — PROPRANOLOL HCL ER 80 MG PO CP24
80.0000 mg | ORAL_CAPSULE | Freq: Every day | ORAL | 4 refills | Status: AC
  Filled 2022-02-17: qty 90, 90d supply, fill #0
  Filled 2022-05-20: qty 90, 90d supply, fill #1
  Filled 2022-08-20: qty 90, 90d supply, fill #2
  Filled 2022-11-13: qty 90, 90d supply, fill #3
  Filled 2023-02-13: qty 90, 90d supply, fill #0

## 2022-02-18 ENCOUNTER — Other Ambulatory Visit: Payer: Self-pay | Admitting: Oncology

## 2022-02-18 DIAGNOSIS — Z1231 Encounter for screening mammogram for malignant neoplasm of breast: Secondary | ICD-10-CM

## 2022-02-19 DIAGNOSIS — H25013 Cortical age-related cataract, bilateral: Secondary | ICD-10-CM | POA: Diagnosis not present

## 2022-02-19 DIAGNOSIS — H524 Presbyopia: Secondary | ICD-10-CM | POA: Diagnosis not present

## 2022-02-19 DIAGNOSIS — H2513 Age-related nuclear cataract, bilateral: Secondary | ICD-10-CM | POA: Diagnosis not present

## 2022-02-19 DIAGNOSIS — E119 Type 2 diabetes mellitus without complications: Secondary | ICD-10-CM | POA: Diagnosis not present

## 2022-02-19 DIAGNOSIS — H5203 Hypermetropia, bilateral: Secondary | ICD-10-CM | POA: Diagnosis not present

## 2022-04-02 ENCOUNTER — Other Ambulatory Visit: Payer: Self-pay

## 2022-04-18 ENCOUNTER — Other Ambulatory Visit: Payer: Self-pay

## 2022-04-18 ENCOUNTER — Other Ambulatory Visit: Payer: Self-pay | Admitting: Obstetrics & Gynecology

## 2022-04-18 ENCOUNTER — Other Ambulatory Visit (HOSPITAL_BASED_OUTPATIENT_CLINIC_OR_DEPARTMENT_OTHER): Payer: Self-pay

## 2022-04-18 MED ORDER — ALENDRONATE SODIUM 70 MG PO TABS
70.0000 mg | ORAL_TABLET | ORAL | 4 refills | Status: AC
Start: 2022-04-18 — End: ?
  Filled 2022-04-18: qty 12, 84d supply, fill #0
  Filled 2022-07-11 – 2022-07-12 (×2): qty 12, 84d supply, fill #1
  Filled 2022-10-13: qty 12, 84d supply, fill #2
  Filled 2023-01-16: qty 12, 84d supply, fill #0

## 2022-04-18 NOTE — Telephone Encounter (Signed)
Last AEX 11/2020 No exam scheduled

## 2022-04-19 ENCOUNTER — Other Ambulatory Visit (HOSPITAL_BASED_OUTPATIENT_CLINIC_OR_DEPARTMENT_OTHER): Payer: Self-pay

## 2022-04-23 ENCOUNTER — Other Ambulatory Visit (HOSPITAL_BASED_OUTPATIENT_CLINIC_OR_DEPARTMENT_OTHER): Payer: Self-pay

## 2022-05-05 ENCOUNTER — Other Ambulatory Visit (HOSPITAL_BASED_OUTPATIENT_CLINIC_OR_DEPARTMENT_OTHER): Payer: Self-pay

## 2022-05-05 ENCOUNTER — Other Ambulatory Visit: Payer: Self-pay

## 2022-05-20 ENCOUNTER — Other Ambulatory Visit (HOSPITAL_BASED_OUTPATIENT_CLINIC_OR_DEPARTMENT_OTHER): Payer: Self-pay

## 2022-05-20 ENCOUNTER — Other Ambulatory Visit: Payer: Self-pay

## 2022-05-21 ENCOUNTER — Other Ambulatory Visit (HOSPITAL_BASED_OUTPATIENT_CLINIC_OR_DEPARTMENT_OTHER): Payer: Self-pay

## 2022-05-23 ENCOUNTER — Other Ambulatory Visit (HOSPITAL_BASED_OUTPATIENT_CLINIC_OR_DEPARTMENT_OTHER): Payer: Self-pay

## 2022-05-26 DIAGNOSIS — I890 Lymphedema, not elsewhere classified: Secondary | ICD-10-CM | POA: Diagnosis not present

## 2022-05-26 DIAGNOSIS — E1169 Type 2 diabetes mellitus with other specified complication: Secondary | ICD-10-CM | POA: Diagnosis not present

## 2022-05-26 DIAGNOSIS — E785 Hyperlipidemia, unspecified: Secondary | ICD-10-CM | POA: Diagnosis not present

## 2022-05-29 ENCOUNTER — Ambulatory Visit
Admission: RE | Admit: 2022-05-29 | Discharge: 2022-05-29 | Disposition: A | Discharge status: Home / Self Care | Payer: Medicare Other | Source: Ambulatory Visit | Attending: Oncology | Admitting: Oncology

## 2022-05-29 DIAGNOSIS — Z1231 Encounter for screening mammogram for malignant neoplasm of breast: Secondary | ICD-10-CM

## 2022-06-29 ENCOUNTER — Other Ambulatory Visit (HOSPITAL_BASED_OUTPATIENT_CLINIC_OR_DEPARTMENT_OTHER): Payer: Self-pay

## 2022-06-29 MED ORDER — METFORMIN HCL ER 500 MG PO TB24
500.0000 mg | ORAL_TABLET | Freq: Every day | ORAL | 4 refills | Status: AC
  Filled 2022-06-29: qty 90, 90d supply, fill #0
  Filled 2022-09-27: qty 90, 90d supply, fill #1
  Filled 2023-02-13: qty 90, 90d supply, fill #0

## 2022-06-30 ENCOUNTER — Other Ambulatory Visit: Payer: Self-pay

## 2022-07-03 ENCOUNTER — Other Ambulatory Visit: Payer: Self-pay | Admitting: Gastroenterology

## 2022-07-03 DIAGNOSIS — R935 Abnormal findings on diagnostic imaging of other abdominal regions, including retroperitoneum: Secondary | ICD-10-CM

## 2022-07-03 DIAGNOSIS — Q453 Other congenital malformations of pancreas and pancreatic duct: Secondary | ICD-10-CM

## 2022-07-12 ENCOUNTER — Other Ambulatory Visit: Payer: Self-pay

## 2022-07-12 ENCOUNTER — Other Ambulatory Visit (HOSPITAL_BASED_OUTPATIENT_CLINIC_OR_DEPARTMENT_OTHER): Payer: Self-pay

## 2022-07-28 ENCOUNTER — Ambulatory Visit
Admission: RE | Admit: 2022-07-28 | Discharge: 2022-07-28 | Disposition: A | Discharge status: Home / Self Care | Payer: Medicare Other | Source: Ambulatory Visit | Attending: Gastroenterology | Admitting: Gastroenterology

## 2022-07-28 DIAGNOSIS — Q453 Other congenital malformations of pancreas and pancreatic duct: Secondary | ICD-10-CM

## 2022-07-28 DIAGNOSIS — N281 Cyst of kidney, acquired: Secondary | ICD-10-CM | POA: Diagnosis not present

## 2022-07-28 DIAGNOSIS — R935 Abnormal findings on diagnostic imaging of other abdominal regions, including retroperitoneum: Secondary | ICD-10-CM

## 2022-07-28 DIAGNOSIS — K8689 Other specified diseases of pancreas: Secondary | ICD-10-CM | POA: Diagnosis not present

## 2022-07-28 MED ORDER — GADOPICLENOL 0.5 MMOL/ML IV SOLN
5.0000 mL | Freq: Once | INTRAVENOUS | Status: AC | PRN
  Administered 2022-07-28: 5 mL via INTRAVENOUS

## 2022-08-20 ENCOUNTER — Other Ambulatory Visit (HOSPITAL_BASED_OUTPATIENT_CLINIC_OR_DEPARTMENT_OTHER): Payer: Self-pay

## 2022-08-21 ENCOUNTER — Other Ambulatory Visit (HOSPITAL_BASED_OUTPATIENT_CLINIC_OR_DEPARTMENT_OTHER): Payer: Self-pay

## 2022-09-01 ENCOUNTER — Other Ambulatory Visit (HOSPITAL_BASED_OUTPATIENT_CLINIC_OR_DEPARTMENT_OTHER): Payer: Self-pay

## 2022-10-16 ENCOUNTER — Other Ambulatory Visit (HOSPITAL_BASED_OUTPATIENT_CLINIC_OR_DEPARTMENT_OTHER): Payer: Self-pay

## 2022-11-12 DIAGNOSIS — E1169 Type 2 diabetes mellitus with other specified complication: Secondary | ICD-10-CM | POA: Diagnosis not present

## 2022-11-12 DIAGNOSIS — E7849 Other hyperlipidemia: Secondary | ICD-10-CM | POA: Diagnosis not present

## 2022-11-12 DIAGNOSIS — M8580 Other specified disorders of bone density and structure, unspecified site: Secondary | ICD-10-CM | POA: Diagnosis not present

## 2022-11-12 DIAGNOSIS — Z0000 Encounter for general adult medical examination without abnormal findings: Secondary | ICD-10-CM | POA: Diagnosis not present

## 2022-11-12 DIAGNOSIS — E785 Hyperlipidemia, unspecified: Secondary | ICD-10-CM | POA: Diagnosis not present

## 2022-11-12 DIAGNOSIS — R82998 Other abnormal findings in urine: Secondary | ICD-10-CM | POA: Diagnosis not present

## 2022-11-14 ENCOUNTER — Other Ambulatory Visit (HOSPITAL_BASED_OUTPATIENT_CLINIC_OR_DEPARTMENT_OTHER): Payer: Self-pay

## 2022-11-14 ENCOUNTER — Inpatient Hospital Stay: Payer: Medicare Other | Attending: Oncology | Admitting: Oncology

## 2022-11-14 VITALS — BP 127/81 | HR 63 | Temp 98.1°F | Resp 18 | Ht 60.0 in | Wt 102.2 lb

## 2022-11-14 DIAGNOSIS — C50512 Malignant neoplasm of lower-outer quadrant of left female breast: Secondary | ICD-10-CM | POA: Diagnosis not present

## 2022-11-14 DIAGNOSIS — Z79811 Long term (current) use of aromatase inhibitors: Secondary | ICD-10-CM | POA: Insufficient documentation

## 2022-11-14 DIAGNOSIS — Z170 Estrogen receptor positive status [ER+]: Secondary | ICD-10-CM | POA: Insufficient documentation

## 2022-11-14 DIAGNOSIS — C50912 Malignant neoplasm of unspecified site of left female breast: Secondary | ICD-10-CM | POA: Diagnosis not present

## 2022-11-14 NOTE — Progress Notes (Signed)
[facility] OFFICE PROGRESS NOTE   Diagnosis: Breast cancer  INTERVAL HISTORY:   This Cheryl [name] returns as scheduled.  She feels well.  Good appetite.  No change at the right chest wall or left breast.  A left breast mammogram on 05/29/2022.  She continues anastrozole.  She had a skin infection at the right arm within the past few months.  She used Keflex to treat the infection.  Objective:  Vital signs in last 24 hours:  Blood pressure 127/81, pulse 63, temperature 98.1 F (36.7 C), temperature source Oral, resp. rate 18, height 5' (1.524 m), weight 102 lb 3.2 oz (46.4 kg), SpO2 100%.    Lymphatics: No cervical, supraclavicular, or axillary nodes Resp: Lungs clear bilaterally Cardio: Regular rate and rhythm GI: Nontender, no hepatosplenomegaly Vascular: No leg edema Breast: Right mastectomy with a TRAM reconstruction.  Nodular firm tissue at the medial aspect of the TRAM.  Left breast without mass.  Lab Results:  Lab Results  Component Value Date   WBC 4.2 01/09/2017   HGB 12.3 01/09/2017   HCT 35.0 (L) 01/09/2017   MCV 89.3 01/09/2017   PLT 152 01/09/2017   NEUTROABS 4.1 04/29/2016    CMP  Lab Results  Component Value Date   NA 139 01/09/2017   K 4.2 01/09/2017   CL 104 01/09/2017   CO2 27 01/09/2017   GLUCOSE 123 (H) 01/09/2017   BUN 16 01/09/2017   CREATININE 0.65 01/09/2017   CALCIUM 9.5 01/09/2017   PROT 7.2 01/09/2017   ALBUMIN 4.4 01/09/2017   AST 28 01/09/2017   ALT 25 01/09/2017   ALKPHOS 73 01/09/2017   BILITOT 1.1 01/09/2017   GFRNONAA >60 01/09/2017   GFRAA >60 01/09/2017     Medications: I have reviewed the patient's current medications.   Assessment/Plan: 1.Stage III right-sided breast cancer diagnosed in 1999. She remains in clinical remission. She completed adjuvant Femara therapy at the end of August 2009.   2. Left renal lesion on CT scan in April 2008 - a renal ultrasound confirmed bilateral renal cysts.  Followed by Dr.  Retta Diones with repeat imaging to follow multiple renal cysts 3. Osteopenia - she continues vitamin D.   4. 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   5. BRCA2 variant of unclear clinical significance, with a significant family history of breast cancer.  Negative breast/ovarian gene panel 06/15/2015 6. History of hematuria - reports being diagnosed with a renal stone by Dr Retta Diones.  7. Left breast cancer-invasive lobular carcinoma, HER-2 negative, ER positive, PR positive, Ki-20 percent confirmed on a core biopsy of a left lower outer quadrant mass on 03/10/2016 Left lumpectomy an sentinel lymph node biopsy01/17/2018,1.5 cm lobular carcinoma, grade 2,pT1c,pN0 Oncotype-score 17, low risk Initiation of adjuvant left breast radiation 06/12/2016, completed 07/28/2016 Initiation of adjuvant Arimidex  07/28/2016   8.  Small pancreas cysts noted on MRI 06/25/2017-stable on follow-up MRI 11/27/2018, 06/26/2020, 07/28/2022  9.   Left pleural thickening at the major fissure noted on the [facility] CT 06/11/2017-I reviewed the CT images and radiology, this area of thickening was present on a CT from 2008 and is slightly "thicker ".  Low clinical suspicion for malignancy. 10.  Diabetes    Disposition: [name] is in clinical remission from breast cancer.  She will continue anastrozole for a total of 7 years.  The plan is to discontinue anastrozole at the end of March 2025.  She will continue yearly mammography.   [name] plans to  relocate to [facility] within the next 1-2 months.  She will establish care with an oncologist and primary provider there.  She will schedule a follow-up bone density scan when she relocates to [place].  She is not scheduled for follow-up at the Cancer center.  I am available to see her as needed.  Thornton Papas, MD  11/14/2022  9:46 AM

## 2022-11-19 ENCOUNTER — Other Ambulatory Visit (HOSPITAL_COMMUNITY): Payer: Self-pay

## 2022-11-19 ENCOUNTER — Other Ambulatory Visit (HOSPITAL_BASED_OUTPATIENT_CLINIC_OR_DEPARTMENT_OTHER): Payer: Self-pay

## 2022-11-19 DIAGNOSIS — Z1331 Encounter for screening for depression: Secondary | ICD-10-CM | POA: Diagnosis not present

## 2022-11-19 DIAGNOSIS — E1169 Type 2 diabetes mellitus with other specified complication: Secondary | ICD-10-CM | POA: Diagnosis not present

## 2022-11-19 DIAGNOSIS — M8580 Other specified disorders of bone density and structure, unspecified site: Secondary | ICD-10-CM | POA: Diagnosis not present

## 2022-11-19 DIAGNOSIS — Z0000 Encounter for general adult medical examination without abnormal findings: Secondary | ICD-10-CM | POA: Diagnosis not present

## 2022-11-19 DIAGNOSIS — I890 Lymphedema, not elsewhere classified: Secondary | ICD-10-CM | POA: Diagnosis not present

## 2022-11-19 DIAGNOSIS — E785 Hyperlipidemia, unspecified: Secondary | ICD-10-CM | POA: Diagnosis not present

## 2022-11-19 DIAGNOSIS — Z1339 Encounter for screening examination for other mental health and behavioral disorders: Secondary | ICD-10-CM | POA: Diagnosis not present

## 2022-11-19 DIAGNOSIS — M1990 Unspecified osteoarthritis, unspecified site: Secondary | ICD-10-CM | POA: Diagnosis not present

## 2022-11-19 DIAGNOSIS — C50911 Malignant neoplasm of unspecified site of right female breast: Secondary | ICD-10-CM | POA: Diagnosis not present

## 2022-11-19 MED ORDER — METFORMIN HCL ER 500 MG PO TB24
500.0000 mg | ORAL_TABLET | Freq: Two times a day (BID) | ORAL | 3 refills | Status: AC
  Filled 2022-11-19 – 2023-03-27 (×4): qty 180, 90d supply, fill #0
  Filled 2023-08-20: qty 180, 90d supply, fill #1

## 2022-12-05 ENCOUNTER — Other Ambulatory Visit: Payer: Self-pay

## 2022-12-05 ENCOUNTER — Other Ambulatory Visit (HOSPITAL_BASED_OUTPATIENT_CLINIC_OR_DEPARTMENT_OTHER): Payer: Self-pay

## 2022-12-05 ENCOUNTER — Other Ambulatory Visit: Payer: Self-pay | Admitting: Nurse Practitioner

## 2022-12-05 ENCOUNTER — Other Ambulatory Visit: Payer: Self-pay | Admitting: Cardiology

## 2022-12-05 MED ORDER — ROSUVASTATIN CALCIUM 40 MG PO TABS
40.0000 mg | ORAL_TABLET | Freq: Every day | ORAL | 0 refills | Status: AC
  Filled 2022-12-05: qty 30, 30d supply, fill #0

## 2022-12-05 MED ORDER — EZETIMIBE 10 MG PO TABS
10.0000 mg | ORAL_TABLET | Freq: Every day | ORAL | 0 refills | Status: AC
  Filled 2022-12-05: qty 30, 30d supply, fill #0

## 2022-12-05 MED ORDER — ANASTROZOLE 1 MG PO TABS
1.0000 mg | ORAL_TABLET | Freq: Every day | ORAL | 3 refills | Status: DC
  Filled 2022-12-05 – 2023-03-27 (×2): qty 90, 90d supply, fill #0

## 2022-12-06 ENCOUNTER — Other Ambulatory Visit: Payer: Self-pay

## 2022-12-25 DIAGNOSIS — M816 Localized osteoporosis [Lequesne]: Secondary | ICD-10-CM | POA: Diagnosis not present

## 2022-12-25 DIAGNOSIS — Z1231 Encounter for screening mammogram for malignant neoplasm of breast: Secondary | ICD-10-CM | POA: Diagnosis not present

## 2022-12-25 DIAGNOSIS — Z01419 Encounter for gynecological examination (general) (routine) without abnormal findings: Secondary | ICD-10-CM | POA: Diagnosis not present

## 2023-01-16 ENCOUNTER — Other Ambulatory Visit (HOSPITAL_BASED_OUTPATIENT_CLINIC_OR_DEPARTMENT_OTHER): Payer: Self-pay

## 2023-01-16 ENCOUNTER — Other Ambulatory Visit: Payer: Self-pay

## 2023-01-16 ENCOUNTER — Other Ambulatory Visit: Payer: Self-pay | Admitting: Cardiology

## 2023-01-16 ENCOUNTER — Other Ambulatory Visit (HOSPITAL_COMMUNITY): Payer: Self-pay

## 2023-01-17 ENCOUNTER — Other Ambulatory Visit (HOSPITAL_COMMUNITY): Payer: Self-pay

## 2023-01-21 ENCOUNTER — Other Ambulatory Visit (HOSPITAL_COMMUNITY): Payer: Self-pay

## 2023-01-21 ENCOUNTER — Encounter (HOSPITAL_COMMUNITY): Payer: Self-pay

## 2023-02-13 ENCOUNTER — Other Ambulatory Visit (HOSPITAL_COMMUNITY): Payer: Self-pay

## 2023-02-13 ENCOUNTER — Other Ambulatory Visit (HOSPITAL_BASED_OUTPATIENT_CLINIC_OR_DEPARTMENT_OTHER): Payer: Self-pay

## 2023-03-10 ENCOUNTER — Other Ambulatory Visit (HOSPITAL_COMMUNITY): Payer: Self-pay

## 2023-03-27 ENCOUNTER — Other Ambulatory Visit: Payer: Self-pay

## 2023-05-13 ENCOUNTER — Other Ambulatory Visit (HOSPITAL_COMMUNITY): Payer: Self-pay

## 2023-05-26 NOTE — Progress Notes (Signed)
 This encounter was created in error - please disregard.

## 2023-06-30 ENCOUNTER — Other Ambulatory Visit (HOSPITAL_COMMUNITY): Payer: Self-pay

## 2023-07-04 IMAGING — MG MM DIGITAL DIAGNOSTIC UNILAT*L* W/ TOMO W/ CAD
5 series · 6 of 13 positions shown · non-contrast
Comparison: Previous exam(s).

CLINICAL DATA: 70-year-old female presenting for annual exam.
History of left breast cancer status post lumpectomy in 4072.
History of right breast cancer status post mastectomy in 3444.

EXAM:
DIGITAL DIAGNOSTIC UNILATERAL LEFT MAMMOGRAM WITH TOMOSYNTHESIS AND
CAD
TECHNIQUE: Left digital diagnostic mammography and breast tomosynthesis was
performed. The images were evaluated with computer-aided detection.

[L MLO]
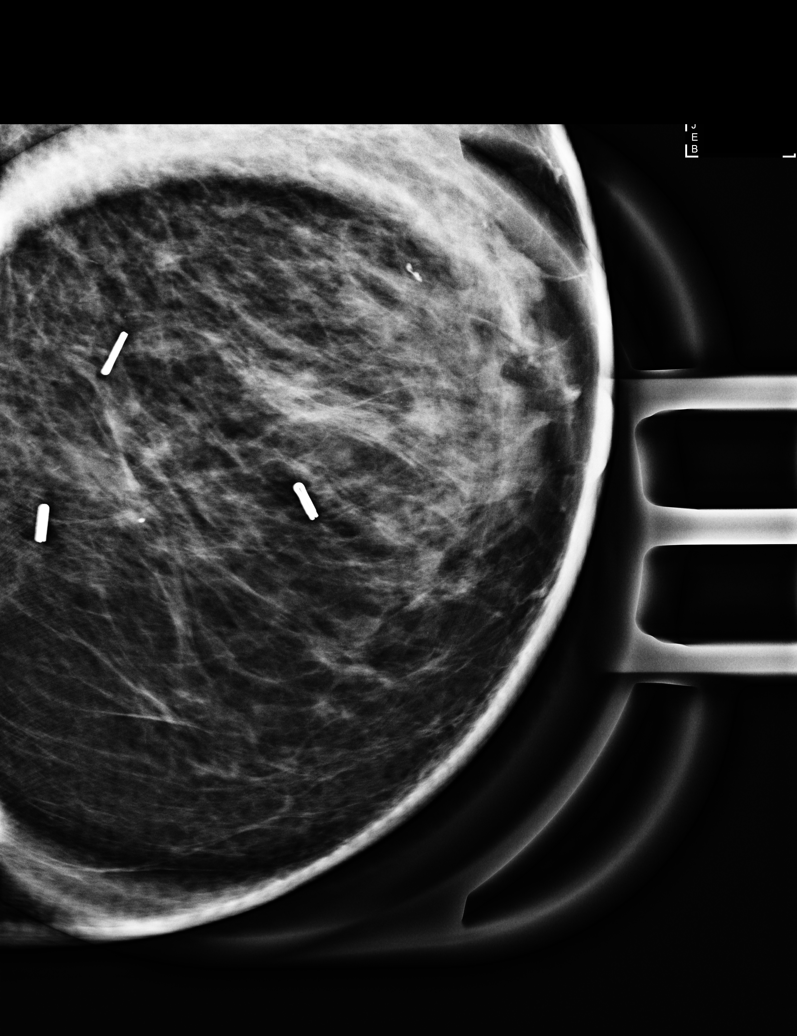

[L MLO synth-2D]
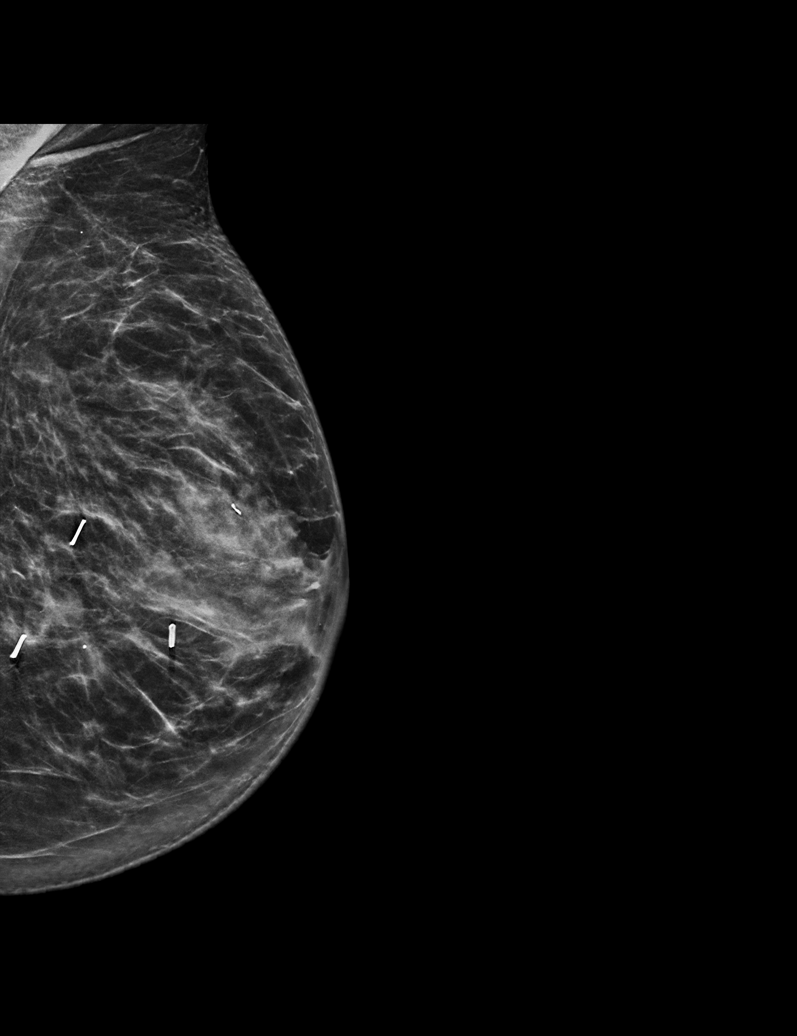

[L CC synth-2D]
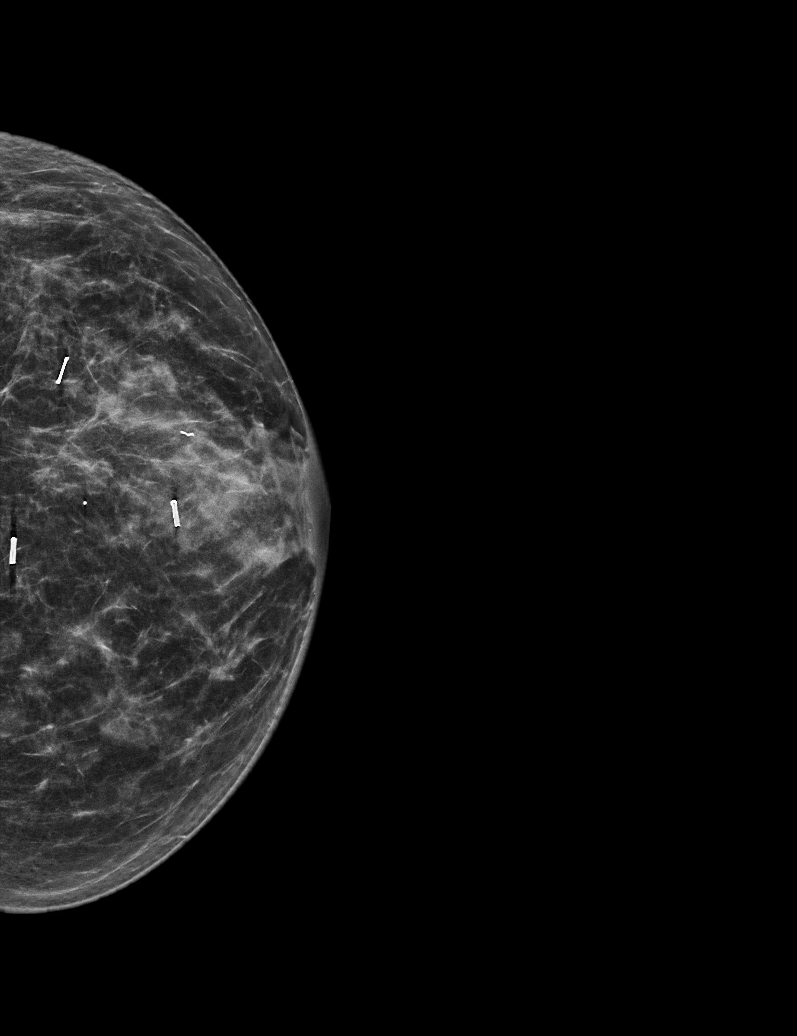

[L CC tomo · 2 of 57 frames shown]
[frame 19/57]
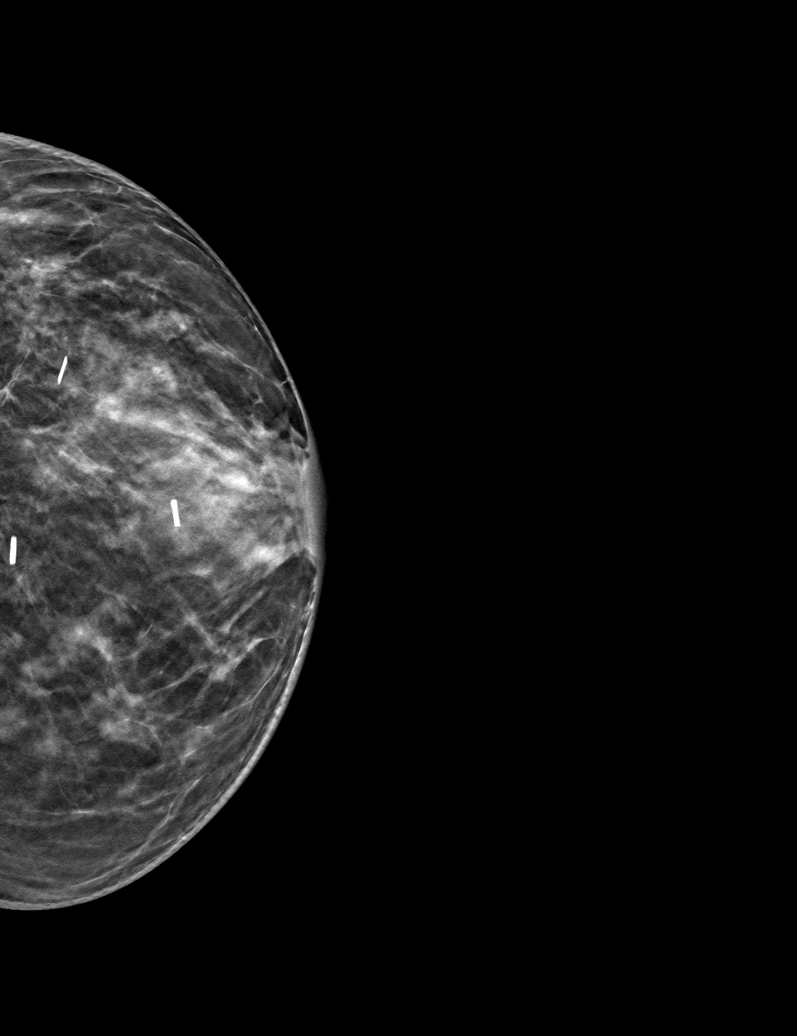
[frame 29/57]
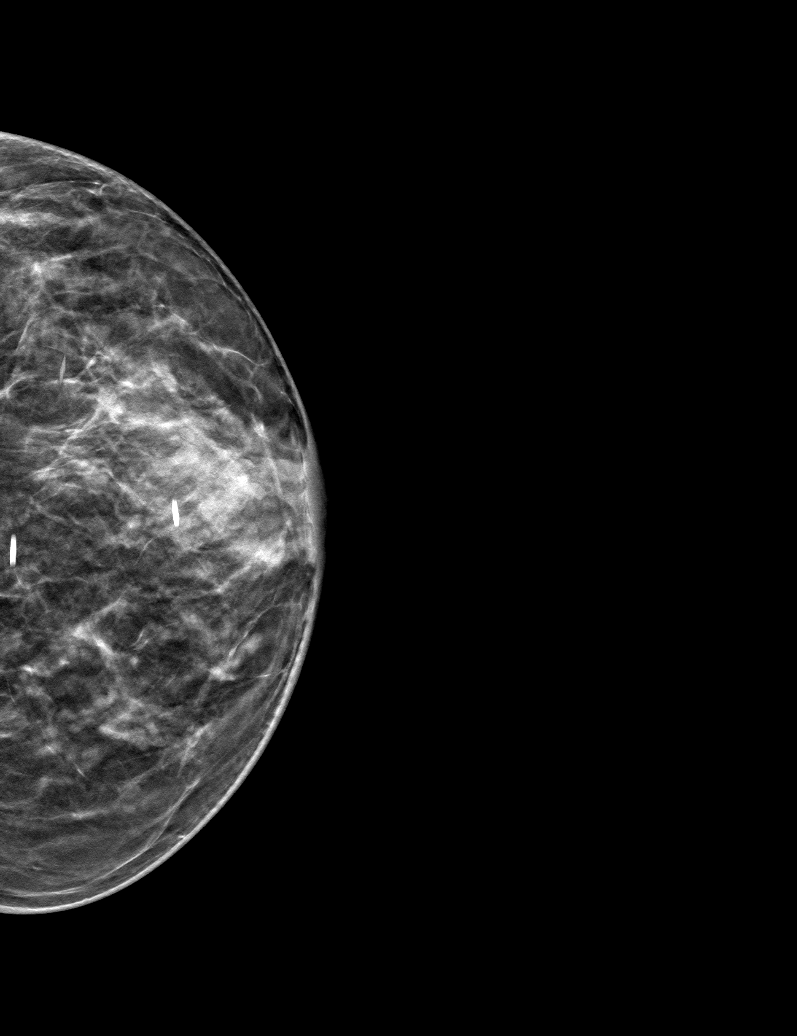

[L MLO tomo · tomo slice 32/63.0]
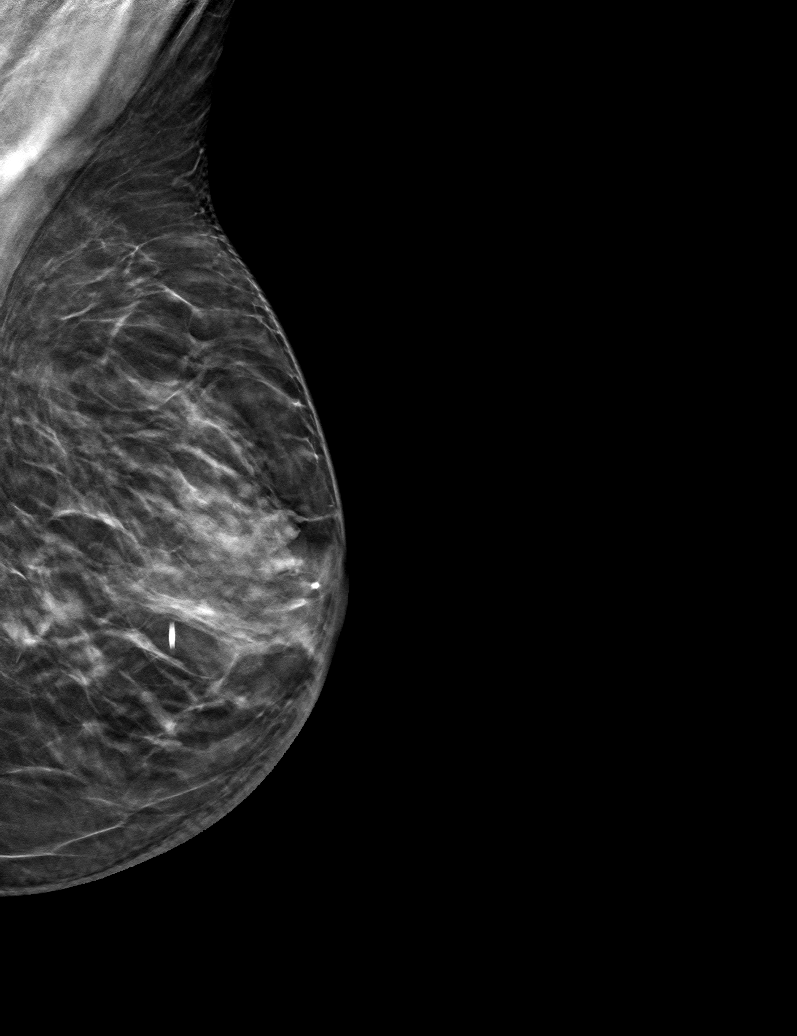

[6 of 13 positions shown; findings below may reference images not displayed]

ACR Breast Density Category c: The breast tissue is heterogeneously
dense, which may obscure small masses.
FINDINGS: A spot 2D magnification view of the left breast lumpectomy site was
performed in addition to standard views. There are stable
postsurgical changes. No suspicious mass, distortion, or
microcalcifications are identified to suggest presence of
malignancy.
IMPRESSION: No mammographic evidence of malignancy in the left breast. Status
post right mastectomy.

RECOMMENDATION:
Screening mammogram in one year.(Code:ZK-[id number])

I have discussed the findings and recommendations with the patient.
If applicable, a reminder letter will be sent to the patient
regarding the next appointment.

BI-RADS CATEGORY  2: Benign.

## 2023-08-20 ENCOUNTER — Other Ambulatory Visit (HOSPITAL_COMMUNITY): Payer: Self-pay

## 2023-08-29 ENCOUNTER — Other Ambulatory Visit (HOSPITAL_COMMUNITY): Payer: Self-pay

## 2023-08-30 ENCOUNTER — Other Ambulatory Visit (HOSPITAL_COMMUNITY): Payer: Self-pay

## 2023-08-30 MED ORDER — BLOOD GLUCOSE TEST VI STRP
ORAL_STRIP | 12 refills | Status: AC
  Filled 2023-08-30: qty 100, 100d supply, fill #0

## 2023-08-30 MED ORDER — LANCETS MISC
11 refills | Status: AC
  Filled 2023-08-30: qty 100, 100d supply, fill #0

## 2023-08-31 ENCOUNTER — Other Ambulatory Visit: Payer: Self-pay

## 2023-12-03 ENCOUNTER — Other Ambulatory Visit (HOSPITAL_COMMUNITY): Payer: Self-pay
